# Patient Record
Sex: Female | Born: 1955 | Race: White | Hispanic: No | Marital: Married | Smoking: Never smoker
Health system: Southern US, Community
[De-identification: ages and names within clinical notes are randomized; demographics above are authoritative.]

## PROBLEM LIST (undated history)

## (undated) ENCOUNTER — Emergency Department: Discharge status: Absent without leave | Payer: Medicare Other

## (undated) DIAGNOSIS — F603 Borderline personality disorder: Secondary | ICD-10-CM

## (undated) DIAGNOSIS — F32A Depression, unspecified: Secondary | ICD-10-CM

## (undated) DIAGNOSIS — M1990 Unspecified osteoarthritis, unspecified site: Secondary | ICD-10-CM

## (undated) DIAGNOSIS — F329 Major depressive disorder, single episode, unspecified: Secondary | ICD-10-CM

## (undated) DIAGNOSIS — B029 Zoster without complications: Secondary | ICD-10-CM

## (undated) DIAGNOSIS — M797 Fibromyalgia: Secondary | ICD-10-CM

## (undated) HISTORY — PX: SHOULDER SURGERY: SHX246

## (undated) HISTORY — PX: GASTRIC BYPASS: SHX52

## (undated) HISTORY — PX: CHOLECYSTECTOMY: SHX55

---

## 2014-02-03 ENCOUNTER — Ambulatory Visit: Payer: Self-pay | Admitting: Family Medicine

## 2014-02-05 ENCOUNTER — Emergency Department: Payer: Self-pay | Admitting: Emergency Medicine

## 2014-02-05 LAB — BASIC METABOLIC PANEL
Anion Gap: 5 — ABNORMAL LOW (ref 7–16)
BUN: 9 mg/dL (ref 7–18)
CHLORIDE: 107 mmol/L (ref 98–107)
CO2: 27 mmol/L (ref 21–32)
Calcium, Total: 8.3 mg/dL — ABNORMAL LOW (ref 8.5–10.1)
Creatinine: 0.88 mg/dL (ref 0.60–1.30)
Glucose: 115 mg/dL — ABNORMAL HIGH (ref 65–99)
Osmolality: 277 (ref 275–301)
Potassium: 4 mmol/L (ref 3.5–5.1)
Sodium: 139 mmol/L (ref 136–145)

## 2014-02-05 LAB — CBC WITH DIFFERENTIAL/PLATELET
BASOS ABS: 0.1 10*3/uL (ref 0.0–0.1)
BASOS PCT: 0.8 %
Eosinophil #: 0.1 10*3/uL (ref 0.0–0.7)
Eosinophil %: 1.9 %
HCT: 35.2 % (ref 35.0–47.0)
HGB: 11.1 g/dL — AB (ref 12.0–16.0)
Lymphocyte #: 1.8 10*3/uL (ref 1.0–3.6)
Lymphocyte %: 27.2 %
MCH: 26.3 pg (ref 26.0–34.0)
MCHC: 31.5 g/dL — AB (ref 32.0–36.0)
MCV: 83 fL (ref 80–100)
MONO ABS: 0.4 x10 3/mm (ref 0.2–0.9)
MONOS PCT: 5.8 %
Neutrophil #: 4.2 10*3/uL (ref 1.4–6.5)
Neutrophil %: 64.3 %
PLATELETS: 330 10*3/uL (ref 150–440)
RBC: 4.22 10*6/uL (ref 3.80–5.20)
RDW: 14.6 % — AB (ref 11.5–14.5)
WBC: 6.5 10*3/uL (ref 3.6–11.0)

## 2014-02-05 LAB — URINALYSIS, COMPLETE
BILIRUBIN, UR: NEGATIVE
Blood: NEGATIVE
Glucose,UR: NEGATIVE mg/dL (ref 0–75)
KETONE: NEGATIVE
Nitrite: NEGATIVE
Ph: 6 (ref 4.5–8.0)
Protein: NEGATIVE
RBC, UR: NONE SEEN /HPF (ref 0–5)
SPECIFIC GRAVITY: 1.03 (ref 1.003–1.030)

## 2014-02-05 LAB — HEPATIC FUNCTION PANEL A (ARMC)
AST: 21 U/L (ref 15–37)
Albumin: 3.2 g/dL — ABNORMAL LOW (ref 3.4–5.0)
Alkaline Phosphatase: 106 U/L
BILIRUBIN DIRECT: 0.1 mg/dL (ref 0.00–0.20)
BILIRUBIN TOTAL: 0.6 mg/dL (ref 0.2–1.0)
SGPT (ALT): 19 U/L
TOTAL PROTEIN: 7 g/dL (ref 6.4–8.2)

## 2014-02-05 LAB — SEDIMENTATION RATE: Erythrocyte Sed Rate: 49 mm/hr — ABNORMAL HIGH (ref 0–30)

## 2014-02-05 LAB — LIPASE, BLOOD: LIPASE: 76 U/L (ref 73–393)

## 2014-02-05 LAB — TROPONIN I

## 2014-02-05 LAB — TSH: Thyroid Stimulating Horm: 2.63 u[IU]/mL

## 2014-02-08 ENCOUNTER — Emergency Department: Payer: Self-pay | Admitting: Emergency Medicine

## 2014-02-08 LAB — TROPONIN I

## 2014-02-08 LAB — COMPREHENSIVE METABOLIC PANEL
ALBUMIN: 3.3 g/dL — AB (ref 3.4–5.0)
ANION GAP: 3 — AB (ref 7–16)
Alkaline Phosphatase: 98 U/L
BUN: 8 mg/dL (ref 7–18)
Bilirubin,Total: 0.4 mg/dL (ref 0.2–1.0)
CREATININE: 0.84 mg/dL (ref 0.60–1.30)
Calcium, Total: 8.7 mg/dL (ref 8.5–10.1)
Chloride: 109 mmol/L — ABNORMAL HIGH (ref 98–107)
Co2: 29 mmol/L (ref 21–32)
EGFR (African American): >60
GLUCOSE: 96 mg/dL (ref 65–99)
Osmolality: 279 (ref 275–301)
Potassium: 3.1 mmol/L — ABNORMAL LOW (ref 3.5–5.1)
SGOT(AST): 20 U/L (ref 15–37)
SGPT (ALT): 16 U/L
SODIUM: 141 mmol/L (ref 136–145)
TOTAL PROTEIN: 7.4 g/dL (ref 6.4–8.2)

## 2014-02-08 LAB — CBC
HCT: 35.9 % (ref 35.0–47.0)
HGB: 11.6 g/dL — ABNORMAL LOW (ref 12.0–16.0)
MCH: 26.8 pg (ref 26.0–34.0)
MCHC: 32.2 g/dL (ref 32.0–36.0)
MCV: 83 fL (ref 80–100)
PLATELETS: 345 10*3/uL (ref 150–440)
RBC: 4.31 10*6/uL (ref 3.80–5.20)
RDW: 14.5 % (ref 11.5–14.5)
WBC: 10.1 10*3/uL (ref 3.6–11.0)

## 2014-06-30 ENCOUNTER — Inpatient Hospital Stay: Payer: Self-pay | Admitting: Psychiatry

## 2014-07-29 ENCOUNTER — Inpatient Hospital Stay: Payer: Self-pay | Admitting: Psychiatry

## 2014-09-14 NOTE — Consult Note (Signed)
PATIENT NAME:  Ann Bowen, Ann Bowen MR#:  [id number] DATE OF BIRTH:  Jun 01, 1955  IDENTIFYING INFORMATION AND REASON FOR CONSULT: A 59 year old woman with a history of depression, who comes in the hospital stating, "things have escalated."   HISTORY OF PRESENT ILLNESS: The patient's daughter got married in [place] yesterday. This was completely anticipated, but at the same time, the patient got overwhelmed. Mood is feeling very upset and depressed. She started having suicidal thoughts with thoughts of killing herself by stepping out into traffic or crashing a car. She has been compliant with the medications she was prescribed when she was discharged and has seen a Marketing executive, but has not gone to see a psychiatrist yet. Appointment had not come up yet. The patient denies that she has been drinking or using any drugs. Major stress is the problems in her family life. She had been sleeping okay up until yesterday. Not having any hallucinations. Mood, however, had been consistently sad and depressed. She has had some violent fantasies about her daughter, but has no plan or intention of doing anything violent.   PAST PSYCHIATRIC HISTORY: The patient was just discharged from our hospital within the last week or so. Has had 2 prior psychiatric hospitalizations before. Prior history of suicide attempts. Prior history of multiple medications. Past history of possible bipolar disorder, no clear history of mania.   FAMILY HISTORY: Multiple members of the family with depression and bipolar disorder.   SOCIAL HISTORY: Still living with her sister. Still focused strongly on her problems with her adult children.   PAST MEDICAL HISTORY: History of fibromyalgia and restless legs.   Substance abuse history: Denies that she has been using any alcohol or drugs since last discharge.   CURRENT MEDICATIONS: Tegretol 200 mg b.i.d., clonazepam 1 mg b.i.d., Flexeril 10 mg at night as needed for muscle pain, duloxetine 60 mg  twice a day, Remeron 7.5 mg at night, Requip 4 mg at night, trazodone 200 mg at night.   ALLERGIES: WELLBUTRIN.  REVIEW OF SYSTEMS: Depressed mood. Low energy. Suicidal ideation with plan of stepping in front of traffic. Violent thoughts, but no clear homicidal ideation. No hallucinations.   MENTAL STATUS EXAMINATION: Disheveled woman, looks her stated age or older, cooperative with the interview. Eye contact good. Psychomotor activity sluggish. Speech is slow and decreased in amount. Affect sad and tearful. Mood stated as depressed. Thoughts are lucid without obvious loosening of associations or delusions. Denies auditory or visual hallucinations. Endorses suicidal ideation. No homicidal ideation. She is alert and oriented x4. Repeats 3 objects immediately, remembers 2 out of 3 at 3 minutes. Judgment and insight reasonably adequate. Normal intelligence.   LABORATORY RESULTS: Laboratories done in the Emergency Room included an alcohol level that is negative. Chemistry panel all normal. Drug screen positive for benzodiazepines. CBC with slightly low hemoglobin 11.2. Urinalysis 2+ leukocyte esterase, likely infection.   ASSESSMENT: This is a 59 year old woman with severe major depression, recurrent, comes back into the hospital with depressed mood, active suicidal ideation. Has been compliant and not abusing substances, but continues to be under major stresses. Needs hospitalization for safety.   TREATMENT PLAN: Admit to psychiatry. Suicide precautions in place. Continue medications from previously. Septra as being prescribed for urinary tract infection.   DIAGNOSIS, PRINCIPAL AND PRIMARY:   AXIS I: Major depression, severe, recurrent.   SECONDARY DIAGNOSES:  AXIS I: Rule out bipolar disorder, type II.   AXIS II: Deferred.   AXIS III: History of fibromyalgia, urinary tract infection.  ____________________________ Gonzella Lex, MD jtc:ap D: 07/29/2014 18:19:31 ET T: 07/29/2014 18:42:28  ET JOB#: [id number]  cc: Gonzella Lex, MD, <Dictator> Gonzella Lex MD ELECTRONICALLY SIGNED 08/01/2014 10:17

## 2014-09-14 NOTE — Consult Note (Signed)
Brief Consult Note: Diagnosis: depression.   Patient was seen by consultant.   Consult note dictated.   Orders entered.   Comments: Psychiatry: Worsening depression with suicidal ideation. Not safe for discharge. Will restart meds and admit when bed avavilible.  Electronic Signatures: Jadzia Ibsen, Madie Reno (MD)  (Signed 15-Mar-16 12:00)  Authored: Brief Consult Note   Last Updated: 15-Mar-16 12:00 by Gonzella Lex (MD)

## 2014-09-14 NOTE — Consult Note (Signed)
PATIENT NAME:  Ann Bowen, Ann Bowen MR#:  [id number] DATE OF BIRTH:  Feb 23, 1956  DATE OF CONSULTATION:  06/30/2014  CONSULTING PHYSICIAN:  Gonzella Lex, MD  IDENTIFYING INFORMATION AND REASON FOR CONSULTATION: A 59 year old woman with a past history of depression who came into the hospital because of suicide attempt. The patient's chief complaint "It's been building over weeks."  HISTORY OF PRESENT ILLNESS: Information obtained from the patient and the chart. The patient cut herself on the left wrist this morning hoping that she would bleed to death. She also took 5-6 of her clonazepam, but says that she did not really expect those to do any harm. Her mood has been depressed for a long time, but things have been getting much worse over the last couple weeks. The last straw was when she found out that not only is she not invited to her daughter's wedding, that her son is planning to attend the wedding. She sees all of this as being a "betrayal." Sleep is poor. Feels down most of the time. Seems to feel hopeless. Having suicidal thoughts. Does not report any hallucinations. She is getting outpatient psychiatric treatment from a doctor affiliated with [facility] and has been compliant with her medication. Says she drinks occasionally, but minimizes the severity of it, and is not abusing other drugs. Major stress currently seems to be the estrangement from her family, especially from her daughter who is not inviting her to the wedding, although sounds like there have been a lot of losses in her life recently.   PAST PSYCHIATRIC HISTORY: It sounds like she has had at least 2 prior psychiatric hospitalizations, 1 at [facility] earlier in 2015 and 1 in [place] several years ago after a suicide attempt. Has taken overdoses in the past. Has been on multiple medications, she cannot remember all of them, but says she is currently taking Cymbalta and Remeron and Klonopin. There has been a question about bipolar disorder.  She is not  sure what medication she might have taken for that.   FAMILY HISTORY: Extensive, with multiple members with depression and bipolar disorder.   SOCIAL HISTORY: Currently living with her sister. Has adult children from whom it sounds like she is either completely or partially estranged. 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. She makes a point of this and clearly feels humiliated about it.   PAST MEDICAL HISTORY: Fibromyalgia, arthritis, and restless leg syndrome.   SUBSTANCE ABUSE HISTORY: Says that in the time since Christmas, she thinks she has drank a couple of bottles of wine. No more than that. Denies any drug abuse. Denies any past substance abuse problems.   CURRENT MEDICATIONS: She could not remember the doses, but according to the reconciliation she is taking Cymbalta 60 mg twice a day, clonazepam 1 mg twice a day, Requip 2 mg at night and from what she told me probably also Remeron 7.5 mg at night.   ALLERGIES: WELLBUTRIN.   REVIEW OF SYSTEMS: Depressed mood. Hopelessness. Suicidal ideation. No hallucinations. Other than general aches and pains, no specific medical complaints.   MENTAL STATUS EXAMINATION: Middle-aged woman, looks her stated age, cooperative with the interview. Eye contact good. Psychomotor activity fairly normal. Speech is normal in rate, tone, and volume. Affect is slightly dysphoric. Mood is stated as depressed. Thoughts lucid without loosening of associations. No evidence of delusions. Denies auditory or  visual hallucinations. Endorses suicidal ideation. No homicidal ideation. She is alert and oriented x 4. Remembers 3/3 objects immediately and at 3 minutes. Judgment and insight recently at least impaired.   LABORATORY RESULTS: Drug screen is all negative. TSH normal at 3.0. Salicylates negative. CBC unremarkable. Alcohol level negative. Chemistry panel  unremarkable.   VITAL SIGNS: Blood pressure 138/69, respirations 18, pulse 70, temperature 98.2.   ASSESSMENT: This is a 59 year old woman with a history of major depression or bipolar disorder, currently depressed, made a suicide attempt. Despite the fairly superficial cuts on her left wrist, it sounds like she actually had suicidal intent and continues to feel hopeless. Needs hospitalization for safety and stabilization.   TREATMENT PLAN: The case discussed with ER doctor and psychiatric team. The patient will be admitted to psychiatry when we have a bed available. Continue current medications. Psychoeducation completed with the patient.   DIAGNOSIS, PRINCIPAL AND PRIMARY:  AXIS I: Major depression, severe, recurrent.   SECONDARY DIAGNOSES: AXIS I: Deferred.  AXIS II:  Deferred. AXIS III: Fibromyalgia.    ____________________________ Gonzella Lex, MD jtc:LT D: 06/30/2014 15:57:14 ET T: 06/30/2014 16:13:25 ET JOB#: [id number]  cc: Gonzella Lex, MD, <Dictator> Gonzella Lex MD ELECTRONICALLY SIGNED 07/22/2014 10:33

## 2014-09-14 NOTE — H&P (Signed)
PATIENT NAME:  Ann Bowen, Ann Bowen MR#:  [id number] DATE OF BIRTH:  09/15/1955  DATE OF ADMISSION:  06/30/2014   REFERRING PHYSICIAN: Emergency Room MD.   ATTENDING PHYSICIAN: Orson Slick, MD.   IDENTIFYING DATA: Ann Bowen is a 59 year old female with history of depression.   CHIEF COMPLAINT: "I cut myself."   HISTORY OF PRESENT ILLNESS:  Ann Bowen has a long history of depression, anxiety, and mood instability.  She moved in [place] a year and a half ago.  She has been in the care of Dr. Luetta Bowen who is associated with [facility].  She has been maintained on a combination of Cymbalta, Remeron and clonazepam with excellent results. She admits that she stopped taking her medications, except for Klonopin, about a week ago.  She became increasingly depressed and anxious.  She received information that her daughter, who is in her 67s and living in [place], got engaged and is getting married in the middle of March. The patient will not be invited to the wedding.  She felt hurt and unimportant, even more so when she realized that her son who lives in [place] is invited to the ceremony.  She felt that her son should have taken a stand and refused to go unless the mother is invited.  She became increasingly distraught, with poor sleep, decreased appetite, anhedonia, feeling of guilt, worthlessness, hopelessness, poor energy and concentration, social isolation, crying spells.  She impulsively sent messages to 15 members of her family telling her goodbyes.  She took 4 Klonopin over 6 hours, which she does not consider an overdose.  She also superficially cut her wrist with a new tomato knife.  She made it clear that she did not want to use any of the kitchen equipment in her sister's house and wanted to use something that does not have any value, sentimental or otherwise to her surviving sister.  By sending text messages, she made her sister aware of the situation. She was brought her  to the [place].  In addition to symptoms of depression, the patient reports a long history of anxiety with panic attacks.  Lately she has 1 panic attack at least every day before getting to work, which she hates.  She also has periods when her social anxiety is somewhat worse and she has difficulties in crowds.  She has times when. She has OCD symptoms with excessive cleaning, but it comes and goes.  She reports frequent mood swings usually from hours to hours. She has periods of deep depression that are difficult to overcome.  She remembers at least 1 episode, when she flew to [place] to see [place] in concert for 10 days in a row.   She does spend money on occasion.  She has periods of, hyperactivity, racing thoughts, increased speech.  She denies ever being psychotic.  She denies alcohol, illicit substance or prescription pill abuse.   PAST PSYCHIATRIC HISTORY: A long history of depression and anxiety. She has been treated with probably almost all antidepressants. She feels that the current combination of Cymbalta and Remeron is working; however, Remeron caused 18 pounds weight gain in the past 6 weeks, which is unacceptable as the patient has a history of gastric bypass.  She attempted suicide once several years ago and when she was still in the [place], after her boyfriend left her and married another woman.  She has 1 hospitalization at [place] since she returned to [place] during which she received 5  or 6 ECT treatments that was very helpful.  She follows up with Dr. Luetta [facility].   FAMILY PSYCHIATRIC HISTORY: Multiple family members with depression and anxiety and 1 sister with real anger problems, possibly bipolar.   PAST MEDICAL HISTORY: Status post gastric bypass, fibromyalgia.   ALLERGIES: WELLBUTRIN.   MEDICATIONS ON ADMISSION:  Klonopin 1 mg 4 times daily, Cymbalta 60 mg twice daily, Remeron 7.5 mg at bedtime, Requip 4 mg at bedtime.   SOCIAL HISTORY: She was married twice.  The 2 children are  from her first marriage, then she married a much more colorful Scientist, research (physical sciences).  She is divorced now.  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  She is very hurt and disappointed.  She was making good money and feeling good about herself.  Since then, she relocated to [place]. She lives with her sister. She has a job as a Scientist, clinical (histocompatibility and immunogenetics) at [place] which she hates.  Her son and daughter are both adults.     REVIEW OF SYSTEMS:  CONSTITUTIONAL: No fevers or chills. Positive for 18 pounds weight again lately.    EYES: No double or blurred vision.  ENT: No hearing loss.  RESPIRATORY: No shortness of breath or cough.  CARDIOVASCULAR: No chest pain or orthopnea.  GASTROINTESTINAL: No abdominal pain, nausea, vomiting, or diarrhea.  GENITOURINARY: No incontinence or frequency.  ENDOCRINE: No heat or cold intolerance.  LYMPHATIC: No anemia or easy bruising.  INTEGUMENTARY: No acne or rash.  MUSCULOSKELETAL: No muscle or joint pain.  NEUROLOGIC: No tingling or weakness.  PSYCHIATRIC: See history of present illness for details.   PHYSICAL EXAMINATION:  VITAL SIGNS: Blood pressure 159/95, pulse 76, temperature 98.2.  GENERAL: This is a slightly obese, middle-aged female in no acute distress.  HEENT: The pupils are equal, round, and reactive to light. Sclerae anicteric.  NECK: Supple. No thyromegaly.  LUNGS: Clear to auscultation. No dullness to percussion.  HEART: Regular rhythm and rate. No murmurs, rubs, or gallops.  ABDOMEN: Soft, nontender, nondistended. Positive bowel sounds.  MUSCULOSKELETAL: Normal muscle strength in all extremities.  SKIN: No rashes or bruises.  LYMPHATIC: No cervical adenopathy.  NEUROLOGIC: Cranial nerves II through XII are intact.   LABORATORY DATA: Chemistries are within normal limits. Blood alcohol level 0. LFTs within normal limits. TSH 3. Urine tox screen negative for substances. CBC within normal limits. Serum  acetaminophen and salicylates are low.   MENTAL STATUS EXAMINATION ON ADMISSION: The patient is alert and oriented to person, place, time and situation. She is pleasant, polite and cooperative. She is well groomed and casually dressed. She maintains good eye contact. Her speech is of normal rhythm, rate and volume.  Mood is depressed with full affect. Thought process is logical and goal oriented. Thought content: She denies thoughts of hurting herself or others, but was admitted after a suicide by overdose and cutting.  She denies thoughts of hurting others. There are no delusions or paranoia. There are no auditory or visual hallucinations. Her cognition is grossly intact. Registration, recall, short and long-term memory are intact. She is of average intelligence and fund of knowledge. Her insight and judgment are limited.   SUICIDE RISK ASSESSMENT ON ADMISSION: This is a patient with a long history of depression, anxiety, and mood instability who was admitted after a suicide attempt.  INITIAL DIAGNOSES:  AXIS I: Major depressive disorder, recurrent, severe, rule out bipolar 2 disorder.  AXIS II:  Deferred.  AXIS III: Fibromyalgia, status post gastric bypass.   PLAN: The patient was admitted to [facility] unit for safety, stabilization and medication management.  1.  Suicidal ideation: The patient is able to contract for safety.  2.  Mood. We will continue Cymbalta and the Remeron for depression, will restart Tegretol for mood stabilization.   3.  Anxiety. We will continue clonazepam 4 mg a day as prescribed in the community.  4.  Smoking. We will provide nicotine products.  The patient is not interested in smoking cessation at this point.   DISPOSITION: She will be discharged to home. She will follow up with Dr. Luetta Bowen as usual.     ____________________________ Wardell Honour. Bary Leriche, MD jbp:DT D: 07/01/2014 15:04:22 ET T: 07/01/2014 15:52:36  ET JOB#: [id number]  cc: Jaan Fischel B. Bary Leriche, MD, <Dictator> Clovis Fredrickson MD ELECTRONICALLY SIGNED 07/28/2014 7:17
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
## 2014-10-16 ENCOUNTER — Emergency Department
Admission: EM | Admit: 2014-10-16 | Discharge: 2014-10-16 | Disposition: A | Discharge status: Home / Self Care | Payer: PRIVATE HEALTH INSURANCE | Attending: Emergency Medicine | Admitting: Emergency Medicine

## 2014-10-16 ENCOUNTER — Encounter: Payer: Self-pay | Admitting: General Practice

## 2014-10-16 ENCOUNTER — Emergency Department: Payer: PRIVATE HEALTH INSURANCE

## 2014-10-16 DIAGNOSIS — S199XXA Unspecified injury of neck, initial encounter: Secondary | ICD-10-CM | POA: Diagnosis not present

## 2014-10-16 DIAGNOSIS — S3992XA Unspecified injury of lower back, initial encounter: Secondary | ICD-10-CM | POA: Diagnosis not present

## 2014-10-16 DIAGNOSIS — Y998 Other external cause status: Secondary | ICD-10-CM | POA: Diagnosis not present

## 2014-10-16 DIAGNOSIS — S4992XA Unspecified injury of left shoulder and upper arm, initial encounter: Secondary | ICD-10-CM | POA: Diagnosis present

## 2014-10-16 DIAGNOSIS — Y92410 Unspecified street and highway as the place of occurrence of the external cause: Secondary | ICD-10-CM | POA: Insufficient documentation

## 2014-10-16 DIAGNOSIS — Y9389 Activity, other specified: Secondary | ICD-10-CM | POA: Insufficient documentation

## 2014-10-16 DIAGNOSIS — S46912A Strain of unspecified muscle, fascia and tendon at shoulder and upper arm level, left arm, initial encounter: Secondary | ICD-10-CM | POA: Diagnosis not present

## 2014-10-16 MED ORDER — NAPROXEN 500 MG PO TABS: 500.0000 mg | ORAL_TABLET | Freq: Two times a day (BID) | ORAL | Status: DC

## 2014-10-16 MED ORDER — DIAZEPAM 2 MG PO TABS: 2.0000 mg | ORAL_TABLET | Freq: Three times a day (TID) | ORAL | Status: DC | PRN
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
## 2014-12-06 ENCOUNTER — Encounter: Payer: Self-pay | Admitting: Emergency Medicine

## 2014-12-06 ENCOUNTER — Emergency Department
Admission: EM | Admit: 2014-12-06 | Discharge: 2014-12-06 | Disposition: A | Discharge status: Home / Self Care | Payer: PRIVATE HEALTH INSURANCE | Attending: Emergency Medicine | Admitting: Emergency Medicine

## 2014-12-06 DIAGNOSIS — R21 Rash and other nonspecific skin eruption: Secondary | ICD-10-CM | POA: Diagnosis present

## 2014-12-06 DIAGNOSIS — L259 Unspecified contact dermatitis, unspecified cause: Secondary | ICD-10-CM | POA: Insufficient documentation

## 2014-12-06 HISTORY — DX: Depression, unspecified: F32.A

## 2014-12-06 HISTORY — DX: Fibromyalgia: M79.7

## 2014-12-06 HISTORY — DX: Unspecified osteoarthritis, unspecified site: M19.90

## 2014-12-06 HISTORY — DX: Zoster without complications: B02.9

## 2014-12-06 HISTORY — DX: Borderline personality disorder: F60.3

## 2014-12-06 HISTORY — DX: Major depressive disorder, single episode, unspecified: F32.9

## 2014-12-06 MED ORDER — DIPHENHYDRAMINE HCL 25 MG PO CAPS: 25.0000 mg | ORAL_CAPSULE | ORAL | Status: DC | PRN

## 2014-12-06 MED ORDER — PREDNISONE 10 MG PO TABS: 10.0000 mg | ORAL_TABLET | Freq: Every day | ORAL | Status: DC

## 2014-12-06 MED ORDER — PREDNISONE 20 MG PO TABS
60.0000 mg | ORAL_TABLET | Freq: Once | ORAL | Status: AC
  Administered 2014-12-06: 60 mg via ORAL
  Filled 2014-12-06: qty 3

## 2014-12-06 NOTE — ED Notes (Signed)
Pt presents to ER alert and in NAD. Pt states rash all over body for several days.

## 2014-12-06 NOTE — Discharge Instructions (Signed)
Contact Dermatitis °Contact dermatitis is a rash that happens when something touches the skin. You touched something that irritates your skin, or you have allergies to something you touched. °HOME CARE  °· Avoid the thing that caused your rash. °· Keep your rash away from hot water, soap, sunlight, chemicals, and other things that might bother it. °· Do not scratch your rash. °· You can take cool baths to help stop itching. °· Only take medicine as told by your doctor. °· Keep all doctor visits as told. °GET HELP RIGHT AWAY IF:  °· Your rash is not better after 3 days. °· Your rash gets worse. °· Your rash is puffy (swollen), tender, red, sore, or warm. °· You have problems with your medicine. °MAKE SURE YOU:  °· Understand these instructions. °· Will watch your condition. °· Will get help right away if you are not doing well or get worse. °Document Released: 02/27/2009 Document Revised: 07/25/2011 Document Reviewed: 10/05/2010 °ExitCare® Patient Information ©2015 [employer]. This information is not intended to replace advice given to you by your health care provider. Make sure you discuss any questions you have with your health care provider. ° °

## 2014-12-06 NOTE — ED Provider Notes (Signed)
CSN: [id number]     Arrival date & time 12/06/14  2220 History   First MD Initiated Contact with Patient 12/06/14 2321     Chief Complaint  Patient presents with  . Rash    Pt presents to ER alert and in NAD. Pt states rash all over body for several days.     (Consider location/radiation/quality/duration/timing/severity/associated sxs/prior Treatment) HPI  59 year old female presents today for evaluation of rash. Rash is been present for 3 days. She states it began after she was in the woods. She denies any contact with any specific irritant. Rashes on both arms and both legs. She describes the rash as pruritic with hives. She denies any chest pain shortness of breath fevers headaches or recent tick bite. Patient has not tried any and histamines for pruritus.   Past Medical History  Diagnosis Date  . Shingles   . Arthritis   . Fibromyalgia   . Depression   . Borderline personality disorder    Past Surgical History  Procedure Laterality Date  . Gastric bypass    . Cholecystectomy    . Shoulder surgery     History reviewed. No pertinent family history. History  Substance Use Topics  . Smoking status: Never Smoker   . Smokeless tobacco: Never Used  . Alcohol Use: No   OB History    No data available     Review of Systems  Constitutional: Negative for fever, chills, activity change and fatigue.  HENT: Negative for congestion, sinus pressure and sore throat.   Eyes: Negative for visual disturbance.  Respiratory: Negative for cough, chest tightness and shortness of breath.   Cardiovascular: Negative for chest pain and leg swelling.  Gastrointestinal: Negative for nausea, vomiting, abdominal pain and diarrhea.  Genitourinary: Negative for dysuria.  Musculoskeletal: Negative for arthralgias and gait problem.  Skin: Positive for rash.  Neurological: Negative for weakness, numbness and headaches.  Hematological: Negative for adenopathy.  Psychiatric/Behavioral: Negative for  behavioral problems, confusion and agitation.      Allergies  Wellbutrin  Home Medications   Prior to Admission medications   Medication Sig Start Date End Date Taking? Authorizing Provider  diazepam (VALIUM) 2 MG tablet Take 1 tablet (2 mg total) by mouth every 8 (eight) hours as needed for anxiety. 10/16/14 10/16/15  Victorino Dike, FNP  diphenhydrAMINE (BENADRYL) 25 mg capsule Take 1 capsule (25 mg total) by mouth every 4 (four) hours as needed. 12/06/14 12/06/15  Duanne Guess, PA-C  naproxen (NAPROSYN) 500 MG tablet Take 1 tablet (500 mg total) by mouth 2 (two) times daily with a meal. 10/16/14 10/16/15  Cari B Triplett, FNP  predniSONE (DELTASONE) 10 MG tablet Take 1 tablet (10 mg total) by mouth daily. 6,5,4,3,2,1 six day taper 12/06/14   Duanne Guess, PA-C   BP 126/63 mmHg  Pulse 84  Temp(Src) 98 F (36.7 C) (Oral)  Resp 20  Ht 5\' 3"  (1.6 m)  Wt 220 lb (99.791 kg)  BMI 38.98 kg/m2  SpO2 97% Physical Exam  Constitutional: She is oriented to person, place, and time. She appears well-developed and well-nourished. No distress.  HENT:  Head: Normocephalic and atraumatic.  Mouth/Throat: Oropharynx is clear and moist.  Eyes: EOM are normal. Pupils are equal, round, and reactive to light. Right eye exhibits no discharge. Left eye exhibits no discharge.  Neck: Normal range of motion. Neck supple.  Cardiovascular: Normal rate, regular rhythm and intact distal pulses.   Pulmonary/Chest: Effort normal and breath sounds normal. No  respiratory distress. She exhibits no tenderness.  Abdominal: Soft. She exhibits no distension. There is no tenderness.  Musculoskeletal: Normal range of motion. She exhibits no edema.  Neurological: She is alert and oriented to person, place, and time. She has normal reflexes.  Skin: Skin is warm and dry. Rash noted. Rash is maculopapular and urticarial.     No bull's-eye lesions. Rash is not present within the digits or on the palms or soles. No oral  lesions seen.  Psychiatric: She has a normal mood and affect. Her behavior is normal. Thought content normal.    ED Course  Procedures (including critical care time) Labs Review Labs Reviewed - No data to display  Imaging Review No results found.   EKG Interpretation None      MDM   Final diagnoses:  Skin rash  Contact dermatitis    59 year old female with urinary rash for the last 3 days. Rash appears to be consistent with contact dermatitis. We'll treat with 6 day prednisone taper and Benadryl. Return to the ER for any worsening symptoms or urgent changes in her health.    Duanne Guess, PA-C 12/06/14 2339  Lavonia Drafts, MD 12/08/14 [phone]

## 2014-12-06 NOTE — ED Notes (Signed)
Pt. States rash developed over most of the body.  Pt. States she was working out in [employer].  Pt. Unsure if they were bites.  Pt. States itching became almost unbearable.

## 2015-03-12 ENCOUNTER — Emergency Department
Admission: EM | Admit: 2015-03-12 | Discharge: 2015-03-12 | Disposition: A | Discharge status: Home / Self Care | Payer: PRIVATE HEALTH INSURANCE | Attending: Emergency Medicine | Admitting: Emergency Medicine

## 2015-03-12 ENCOUNTER — Encounter: Payer: Self-pay | Admitting: *Deleted

## 2015-03-12 ENCOUNTER — Emergency Department
Admission: EM | Admit: 2015-03-12 | Discharge: 2015-03-12 | Discharge status: Absent without leave | Payer: No Typology Code available for payment source

## 2015-03-12 DIAGNOSIS — Z791 Long term (current) use of non-steroidal anti-inflammatories (NSAID): Secondary | ICD-10-CM | POA: Diagnosis not present

## 2015-03-12 DIAGNOSIS — K0889 Other specified disorders of teeth and supporting structures: Secondary | ICD-10-CM | POA: Diagnosis present

## 2015-03-12 DIAGNOSIS — Z79899 Other long term (current) drug therapy: Secondary | ICD-10-CM | POA: Diagnosis not present

## 2015-03-12 DIAGNOSIS — K047 Periapical abscess without sinus: Secondary | ICD-10-CM

## 2015-03-12 DIAGNOSIS — K089 Disorder of teeth and supporting structures, unspecified: Secondary | ICD-10-CM | POA: Diagnosis not present

## 2015-03-12 DIAGNOSIS — K08409 Partial loss of teeth, unspecified cause, unspecified class: Secondary | ICD-10-CM | POA: Insufficient documentation

## 2015-03-12 MED ORDER — OXYCODONE-ACETAMINOPHEN 5-325 MG PO TABS: 1.0000 | ORAL_TABLET | Freq: Four times a day (QID) | ORAL | Status: DC | PRN

## 2015-03-12 MED ORDER — AMOXICILLIN-POT CLAVULANATE 875-125 MG PO TABS: 1.0000 | ORAL_TABLET | Freq: Two times a day (BID) | ORAL | Status: DC

## 2015-03-12 MED ORDER — MAGIC MOUTHWASH W/LIDOCAINE: 5.0000 mL | Freq: Four times a day (QID) | ORAL | Status: DC

## 2015-03-12 NOTE — ED Notes (Signed)
States she had a tooth pulled last week.. Developed swelling and increased pain .Marland Kitchenwas seen by dentist again and had gum/jaw scraped. Now is having increased pain

## 2015-03-12 NOTE — Discharge Instructions (Signed)
Dental Extraction A dental extraction is the removal (extraction) of a tooth. You may need to have a dental extraction if:   You have tooth decay or gum disease.  You have an infection (abscess).  Room needs to be made for other teeth to grow in or to be aligned properly.  Baby (primary) teeth are preventing adult (permanent) teeth from coming to the surface (erupting).  You have a tooth fracture or fractures that are not repairable.  You are going to be having radiation to your head and neck. The type and length of procedure that you have depends on the reason for the extraction and the placement of the tooth or teeth that are being removed. The procedure may be:  A simple extraction. This is done if the tooth is visible in the mouth and is above the gumline.  A surgical extraction. This is done if the tooth has not come into the mouth or if the tooth is broken off below the gumline. LET [facility] CARE PROVIDER KNOW ABOUT:  Any allergies you have.  All medicines you are taking, including vitamins, herbs, eye drops, creams, and over-the-counter medicines.  Previous problems you or members of your family have had with the use of anesthetics.  Any blood disorders you have.  Previous surgeries you have had.  Any medical conditions you may have. RISKS AND COMPLICATIONS Generally, this is a safe procedure. However, problems may occur, including:  Damage to surrounding teeth, nerves, tissues, or structures.  The blood clot does not form or stay in place where the tooth was removed. This causes the bones and nerves underneath to be exposed (dry socket). This can delay healing.  Incomplete extraction of roots.  Jawbone injury, pain, or weakness. BEFORE THE PROCEDURE  Ask your health care provider about:  Changing or stopping your regular medicines. This is especially important if you are taking diabetes medicines or blood thinners.  Taking medicines such as aspirin and  ibuprofen. These medicines can thin your blood. Do not take these medicines before your procedure if your health care provider instructs you not to.  Take medicines, such as antibiotic medicines, as directed by your health care provider.  Follow instructions from your health care provider about eating or drinking restrictions.  Plan to have someone take you home after the procedure.  If you go home right after the procedure, plan to have someone with you for 24 hours. PROCEDURE  You may be given one or more of the following:  A medicine that helps you relax (sedative).  A medicine that numbs the area (local anesthetic).  A medicine that makes you fall asleep (general anesthetic).  If you are having a simple extraction:  Your dentist will loosen the tooth with an instrument called an elevator.  Another instrument called forceps will be used to grasp the tooth and remove it from the socket.  The open socket will be cleaned.  Gauze will be placed in the socket to reduce bleeding.  If you are having a surgical extraction:  Your dentist will make an incision in the gum.  Some of the bone around the tooth may need to be removed.  The tooth will be removed.  Stitches (sutures) may be required to close the area. The procedure may vary among health care providers and hospitals. AFTER THE PROCEDURE  You may have gauze in your mouth where the tooth was removed. If directed by your health care provider, apply gentle pressure on the gauze for  up to one hour after the procedure. This will help to control bleeding.  A blood clot should begin to form over the open socket. This is normal. Do not touch the area, and do not rinse it.  You may be given medicines to help control pain and help your recovery.   This information is not intended to replace advice given to you by your health care provider. Make sure you discuss any questions you have with your health care provider.   Document  Released: 05/02/2005 Document Revised: 09/16/2014 Document Reviewed: 04/28/2014 Elsevier Interactive Patient Education 2016 [place] Abscess A dental abscess is a collection of pus in or around a tooth. CAUSES This condition is caused by a bacterial infection around the root of the tooth that involves the inner part of the tooth (pulp). It may result from:  Severe tooth decay.  Trauma to the tooth that allows bacteria to enter into the pulp, such as a broken or chipped tooth.  Severe gum disease around a tooth. SYMPTOMS Symptoms of this condition include:  Severe pain in and around the infected tooth.  Swelling and redness around the infected tooth, in the mouth, or in the face.  Tenderness.  Pus drainage.  Bad breath.  Bitter taste in the mouth.  Difficulty swallowing.  Difficulty opening the mouth.  Nausea.  Vomiting.  Chills.  Swollen neck glands.  Fever. DIAGNOSIS This condition is diagnosed with examination of the infected tooth. During the exam, your dentist may tap on the infected tooth. Your dentist will also ask about your medical and dental history and may order X-rays. TREATMENT This condition is treated by eliminating the infection. This may be done with:  Antibiotic medicine.  A root canal. This may be performed to save the tooth.  Pulling (extracting) the tooth. This may also involve draining the abscess. This is done if the tooth cannot be saved. HOME CARE INSTRUCTIONS  Take medicines only as directed by your dentist.  If you were prescribed antibiotic medicine, finish all of it even if you start to feel better.  Rinse your mouth (gargle) often with salt water to relieve pain or swelling.  Do not drive or operate heavy machinery while taking pain medicine.  Do not apply heat to the outside of your mouth.  Keep all follow-up visits as directed by your dentist. This is important. SEEK MEDICAL CARE IF:  Your pain is worse and  is not helped by medicine. SEEK IMMEDIATE MEDICAL CARE IF:  You have a fever or chills.  Your symptoms suddenly get worse.  You have a very bad headache.  You have problems breathing or swallowing.  You have trouble opening your mouth.  You have swelling in your neck or around your eye.   This information is not intended to replace advice given to you by your health care provider. Make sure you discuss any questions you have with your health care provider.   Document Released: 05/02/2005 Document Revised: 09/16/2014 Document Reviewed: 04/29/2014 Elsevier Interactive Patient Education [employer].

## 2015-03-12 NOTE — ED Provider Notes (Signed)
[facility] Emergency Department Provider Note  ____________________________________________  Time seen: Approximately 5:44 PM  I have reviewed the triage vital signs and the nursing notes.   HISTORY  Chief Complaint Dental Problem    HPI Ann Bowen is a 59 y.o. female resents emergency department complaining of dental pain on the right lower jaw. She states that she had the first bicuspid on the right lower jaw removed a week ago and has had problems since. She states that she was seen by the orthodontist 3 days ago for same. She states that she has had some swelling in the area, some fevers and chills, mild nausea, and increasing pain to area. She states that she has been following on the recommendations by the orthodontist. States the pain is moderate to severe and nothing improves same.   Past Medical History  Diagnosis Date  . Shingles   . Arthritis   . Fibromyalgia   . Depression   . Borderline personality disorder     There are no active problems to display for this patient.   Past Surgical History  Procedure Laterality Date  . Gastric bypass    . Cholecystectomy    . Shoulder surgery      Current Outpatient Rx  Name  Route  Sig  Dispense  Refill  . clonazePAM (KLONOPIN) 1 MG tablet   Oral   Take 1 mg by mouth 2 (two) times daily.         Marland Kitchen HYDROcodone-acetaminophen (NORCO/VICODIN) 5-325 MG tablet   Oral   Take 1 tablet by mouth every 6 (six) hours as needed for moderate pain.         Marland Kitchen QUEtiapine (SEROQUEL) 100 MG tablet   Oral   Take 100 mg by mouth at bedtime.         Marland Kitchen amoxicillin-clavulanate (AUGMENTIN) 875-125 MG tablet   Oral   Take 1 tablet by mouth 2 (two) times daily.   14 tablet   0   . diazepam (VALIUM) 2 MG tablet   Oral   Take 1 tablet (2 mg total) by mouth every 8 (eight) hours as needed for anxiety.   30 tablet   0   . diphenhydrAMINE (BENADRYL) 25 mg capsule   Oral   Take 1 capsule (25 mg  total) by mouth every 4 (four) hours as needed.   30 capsule   0   . magic mouthwash w/lidocaine SOLN   Oral   Take 5 mLs by mouth 4 (four) times daily.   240 mL   0     Dispense in a 1/1/1/1 ratio   . naproxen (NAPROSYN) 500 MG tablet   Oral   Take 1 tablet (500 mg total) by mouth 2 (two) times daily with a meal.   60 tablet   2   . oxyCODONE-acetaminophen (ROXICET) 5-325 MG tablet   Oral   Take 1 tablet by mouth every 6 (six) hours as needed for severe pain.   10 tablet   0   . predniSONE (DELTASONE) 10 MG tablet   Oral   Take 1 tablet (10 mg total) by mouth daily. 6,5,4,3,2,1 six day taper   21 tablet   0     Allergies Wellbutrin  No family history on file.  Social History Social History  Substance Use Topics  . Smoking status: Never Smoker   . Smokeless tobacco: Never Used  . Alcohol Use: No    Review of Systems Constitutional: No fever/chills  Eyes: No visual changes. ENT: No sore throat. Endorses dental pain right lower jaw. Cardiovascular: Denies chest pain. Respiratory: Denies shortness of breath. Gastrointestinal: No abdominal pain.  No nausea, no vomiting.  No diarrhea.  No constipation. Genitourinary: Negative for dysuria. Musculoskeletal: Negative for back pain. Skin: Negative for rash. Neurological: Negative for headaches, focal weakness or numbness.  10-point ROS otherwise negative.  ____________________________________________   PHYSICAL EXAM:  VITAL SIGNS: ED Triage Vitals  Enc Vitals Group     BP 03/12/15 1720 152/73 mmHg     Pulse Rate 03/12/15 1720 72     Resp 03/12/15 1720 18     Temp 03/12/15 1720 97.8 F (36.6 C)     Temp Source 03/12/15 1720 Oral     SpO2 03/12/15 1720 100 %     Weight 03/12/15 1720 220 lb (99.791 kg)     Height 03/12/15 1720 5\' 3"  (1.6 m)     Head Cir --      Peak Flow --      Pain Score 03/12/15 1727 10     Pain Loc --      Pain Edu? --      Excl. in [place]? --     Constitutional: Alert and  oriented. Well appearing and in no acute distress. Eyes: Conjunctivae are normal. PERRL. EOMI. Head: Atraumatic. Nose: No congestion/rhinnorhea. Mouth/Throat: Mucous membranes are moist.  Oropharynx non-erythematous. The first bicuspid tooth on the right lower jaw has been surgically extracted. Mild erythema and edema surrounding socket. No bleeding noted. No abscess noted. Other dental abnormality noted. Neck: No stridor.   Hematological/Lymphatic/Immunilogical: No cervical lymphadenopathy. Cardiovascular: Normal rate, regular rhythm. Grossly normal heart sounds.  Good peripheral circulation. Respiratory: Normal respiratory effort.  No retractions. Lungs CTAB. Gastrointestinal: Soft and nontender. No distention. No abdominal bruits. No CVA tenderness. Musculoskeletal: No lower extremity tenderness nor edema.  No joint effusions. Neurologic:  Normal speech and language. No gross focal neurologic deficits are appreciated. No gait instability. Skin:  Skin is warm, dry and intact. No rash noted. Psychiatric: Mood and affect are normal. Speech and behavior are normal.  ____________________________________________   LABS (all labs ordered are listed, but only abnormal results are displayed)  Labs Reviewed - No data to display ____________________________________________  EKG   ____________________________________________  RADIOLOGY   ____________________________________________   PROCEDURES  Procedure(s) performed: None  Critical Care performed: No  ____________________________________________   INITIAL IMPRESSION / ASSESSMENT AND PLAN / ED COURSE  Pertinent labs & imaging results that were available during my care of the patient were reviewed by me and considered in my medical decision making (see chart for details).  Patient's history, symptoms, physical exam are taken and consideration for diagnosis. I advised the patient that her diagnosis is most consistent with mild  infection from dental extraction. Lyses understanding with same. I'll place the patient on Augmentin for antibiotic control of possible infection. The patient will also be given a limited quantity of oral narcotics. I am also prescribing magic mouthwash for symptomatically relief. The patient verbalizes understanding of the treatment plan and verbalizes compliance with same. The patient to follow up with orthodontist for any continued problems. ____________________________________________   FINAL CLINICAL IMPRESSION(S) / ED DIAGNOSES  Final diagnoses:  Dental infection  S/P tooth extraction, unspecified edentulism      Darletta Moll, PA-C 03/12/15 1818  Hinda Kehr, MD 03/12/15 2358

## 2015-03-12 NOTE — ED Notes (Signed)
Pt had right lower dental extraction last week.  Pt continues to have pain.

## 2015-03-16 ENCOUNTER — Emergency Department: Payer: PRIVATE HEALTH INSURANCE

## 2015-03-16 ENCOUNTER — Emergency Department
Admission: EM | Admit: 2015-03-16 | Discharge: 2015-03-16 | Disposition: A | Discharge status: Home / Self Care | Payer: PRIVATE HEALTH INSURANCE | Attending: Emergency Medicine | Admitting: Emergency Medicine

## 2015-03-16 ENCOUNTER — Encounter: Payer: Self-pay | Admitting: Emergency Medicine

## 2015-03-16 DIAGNOSIS — S60410A Abrasion of right index finger, initial encounter: Secondary | ICD-10-CM | POA: Insufficient documentation

## 2015-03-16 DIAGNOSIS — T148XXA Other injury of unspecified body region, initial encounter: Secondary | ICD-10-CM

## 2015-03-16 DIAGNOSIS — Z79899 Other long term (current) drug therapy: Secondary | ICD-10-CM | POA: Insufficient documentation

## 2015-03-16 DIAGNOSIS — Y9389 Activity, other specified: Secondary | ICD-10-CM | POA: Insufficient documentation

## 2015-03-16 DIAGNOSIS — W540XXA Bitten by dog, initial encounter: Secondary | ICD-10-CM | POA: Insufficient documentation

## 2015-03-16 DIAGNOSIS — S60414A Abrasion of right ring finger, initial encounter: Secondary | ICD-10-CM | POA: Insufficient documentation

## 2015-03-16 DIAGNOSIS — Z792 Long term (current) use of antibiotics: Secondary | ICD-10-CM | POA: Diagnosis not present

## 2015-03-16 DIAGNOSIS — S61451A Open bite of right hand, initial encounter: Secondary | ICD-10-CM | POA: Insufficient documentation

## 2015-03-16 DIAGNOSIS — Y998 Other external cause status: Secondary | ICD-10-CM | POA: Diagnosis not present

## 2015-03-16 DIAGNOSIS — S61452A Open bite of left hand, initial encounter: Secondary | ICD-10-CM | POA: Diagnosis not present

## 2015-03-16 DIAGNOSIS — S61236A Puncture wound without foreign body of right little finger without damage to nail, initial encounter: Secondary | ICD-10-CM | POA: Diagnosis not present

## 2015-03-16 DIAGNOSIS — Y9289 Other specified places as the place of occurrence of the external cause: Secondary | ICD-10-CM | POA: Insufficient documentation

## 2015-03-16 MED ORDER — AMOXICILLIN-POT CLAVULANATE 875-125 MG PO TABS: 1.0000 | ORAL_TABLET | Freq: Two times a day (BID) | ORAL | Status: DC

## 2015-03-16 MED ORDER — BACITRACIN ZINC 500 UNIT/GM EX OINT
1.0000 "application " | TOPICAL_OINTMENT | Freq: Two times a day (BID) | CUTANEOUS | Status: DC
  Administered 2015-03-16: 1 via TOPICAL
  Filled 2015-03-16: qty 0.9

## 2015-03-16 NOTE — ED Notes (Signed)
Will provide Sheriff number to pt on animal control reporting per call from [employer].   [phone]

## 2015-03-16 NOTE — ED Notes (Signed)
Contacted non-emergency sheriff [place], per [employer] for animal control services.

## 2015-03-16 NOTE — ED Notes (Signed)
States "I got in the middle of a dog fight tonight".  Fight between patient's little dog and sisters big dog.  Two puncture wounds to left 5th finger.  Bleeding controlled.  + CMS to digit.  Abrasions to left anterior forearm.  Bleeding controlled and sterile dressing applied.  Superficial abrasion vs puncture wounds to right third and fourth fingers-- bleeding controlled.  Band aids applied.

## 2015-03-16 NOTE — ED Provider Notes (Signed)
[facility] Emergency Department Provider Note  ____________________________________________  Time seen: Approximately 8:11 PM  I have reviewed the triage vital signs and the nursing notes.   HISTORY  Chief Complaint No chief complaint on file.    HPI Ann Bowen is a 59 y.o. female who presents to the emergency department for evaluation of bilateral hand pain after being bitten by dog. She was attempting to break up a dog fight between her dog and her sister's dog. There is a puncture wound on the fifth digit and abrasions to the fingers on the right hand as well. The dog bit her is up-to-date on its vaccinations.Patient is up-to-date on her tetanus shot.   Past Medical History  Diagnosis Date  . Shingles   . Arthritis   . Fibromyalgia   . Depression   . Borderline personality disorder     There are no active problems to display for this patient.   Past Surgical History  Procedure Laterality Date  . Gastric bypass    . Cholecystectomy    . Shoulder surgery      Current Outpatient Rx  Name  Route  Sig  Dispense  Refill  . amoxicillin-clavulanate (AUGMENTIN) 875-125 MG tablet   Oral   Take 1 tablet by mouth 2 (two) times daily.   20 tablet   0   . clonazePAM (KLONOPIN) 1 MG tablet   Oral   Take 1 mg by mouth 2 (two) times daily.         . diazepam (VALIUM) 2 MG tablet   Oral   Take 1 tablet (2 mg total) by mouth every 8 (eight) hours as needed for anxiety.   30 tablet   0   . diphenhydrAMINE (BENADRYL) 25 mg capsule   Oral   Take 1 capsule (25 mg total) by mouth every 4 (four) hours as needed.   30 capsule   0   . HYDROcodone-acetaminophen (NORCO/VICODIN) 5-325 MG tablet   Oral   Take 1 tablet by mouth every 6 (six) hours as needed for moderate pain.         . magic mouthwash w/lidocaine SOLN   Oral   Take 5 mLs by mouth 4 (four) times daily.   240 mL   0     Dispense in a 1/1/1/1 ratio   . naproxen  (NAPROSYN) 500 MG tablet   Oral   Take 1 tablet (500 mg total) by mouth 2 (two) times daily with a meal.   60 tablet   2   . oxyCODONE-acetaminophen (ROXICET) 5-325 MG tablet   Oral   Take 1 tablet by mouth every 6 (six) hours as needed for severe pain.   10 tablet   0   . predniSONE (DELTASONE) 10 MG tablet   Oral   Take 1 tablet (10 mg total) by mouth daily. 6,5,4,3,2,1 six day taper   21 tablet   0   . QUEtiapine (SEROQUEL) 100 MG tablet   Oral   Take 100 mg by mouth at bedtime.           Allergies Wellbutrin  No family history on file.  Social History Social History  Substance Use Topics  . Smoking status: Never Smoker   . Smokeless tobacco: Never Used  . Alcohol Use: No    Review of Systems   Constitutional: No fever/chills Eyes: No visual changes. ENT: No congestion or rhinorrhea Cardiovascular: Denies chest pain. Respiratory: Denies shortness of breath. Gastrointestinal: No abdominal  pain.  No nausea, no vomiting.  No diarrhea.  No constipation. Genitourinary: Negative for dysuria. Musculoskeletal: Negative for back pain. Skin: Dog bite to bilateral hands. Neurological: Negative for headaches, focal weakness or numbness.  10-point ROS otherwise negative.  ____________________________________________   PHYSICAL EXAM:  VITAL SIGNS: ED Triage Vitals  Enc Vitals Group     BP 03/16/15 1948 149/78 mmHg     Pulse Rate 03/16/15 1948 70     Resp 03/16/15 1948 18     Temp 03/16/15 1948 98.2 F (36.8 C)     Temp src --      SpO2 03/16/15 1948 98 %     Weight 03/16/15 1948 220 lb (99.791 kg)     Height 03/16/15 1948 5\' 3"  (1.6 m)     Head Cir --      Peak Flow --      Pain Score 03/16/15 1949 9     Pain Loc --      Pain Edu? --      Excl. in [place]? --     Constitutional: Alert and oriented. Well appearing and in no acute distress. Eyes: Conjunctivae are normal. PERRL. EOMI. Head: Atraumatic. Nose: No congestion/rhinnorhea. Mouth/Throat:  Mucous membranes are moist.  Oropharynx non-erythematous. No oral lesions. Neck: No stridor. Cardiovascular: Normal rate, regular rhythm.  Good peripheral circulation. Respiratory: Normal respiratory effort.  No retractions. Lungs CTAB. Gastrointestinal: Soft and nontender. No distention. No abdominal bruits.  Musculoskeletal: No lower extremity tenderness nor edema.  No joint effusions. Full range of motion of all fingers. Neurologic:  Normal speech and language. No gross focal neurologic deficits are appreciated. Speech is normal. No gait instability. Skin:; Negative for petechiae.  Puncture wound noted to the fifth digit of the left hand. Abrasions noted to the second third and fourth digits of the right hand. Psychiatric: Mood and affect are normal. Speech and behavior are normal.  ____________________________________________   LABS (all labs ordered are listed, but only abnormal results are displayed)  Labs Reviewed - No data to display ____________________________________________  EKG   ____________________________________________  RADIOLOGY  Bilateral hand films are negative for acute abnormality. ____________________________________________   PROCEDURES  Procedure(s) performed:  Hands were soaked in Betadine and normal saline. Bacitracin was then applied under sterile dressing. ____________________________________________   INITIAL IMPRESSION / ASSESSMENT AND PLAN / ED COURSE  Pertinent labs & imaging results that were available during my care of the patient were reviewed by me and considered in my medical decision making (see chart for details).  Patient is currently taking Augmentin due to dental infection. A number of days of Augmentin will be extended. She was instructed to have a provider reevaluate her in 2 days. She was instructed to return to the emergency department at any time for symptoms of concern. ____________________________________________   FINAL  CLINICAL IMPRESSION(S) / ED DIAGNOSES  Final diagnoses:  Dog bite of hand, right, initial encounter  Dog bite of hand, left, initial encounter  Puncture wound       Victorino Dike, FNP 03/16/15 2213  Nance Pear, MD 03/16/15 2241

## 2015-03-16 NOTE — ED Notes (Signed)
Pt. Going home with family. 

## 2015-03-16 NOTE — Discharge Instructions (Signed)

## 2015-03-16 NOTE — ED Notes (Signed)
Wounds soaked in batadine and sterile water. Bacitracin applied to all puncture wounds and scratches.

## 2015-03-20 ENCOUNTER — Emergency Department
Admission: EM | Admit: 2015-03-20 | Discharge: 2015-03-20 | Disposition: A | Discharge status: Home / Self Care | Payer: PRIVATE HEALTH INSURANCE | Attending: Emergency Medicine | Admitting: Emergency Medicine

## 2015-03-20 ENCOUNTER — Emergency Department: Payer: PRIVATE HEALTH INSURANCE

## 2015-03-20 ENCOUNTER — Encounter: Payer: Self-pay | Admitting: *Deleted

## 2015-03-20 DIAGNOSIS — S61259D Open bite of unspecified finger without damage to nail, subsequent encounter: Secondary | ICD-10-CM

## 2015-03-20 DIAGNOSIS — W540XXA Bitten by dog, initial encounter: Secondary | ICD-10-CM

## 2015-03-20 DIAGNOSIS — W540XXD Bitten by dog, subsequent encounter: Secondary | ICD-10-CM | POA: Diagnosis not present

## 2015-03-20 DIAGNOSIS — Z792 Long term (current) use of antibiotics: Secondary | ICD-10-CM | POA: Diagnosis not present

## 2015-03-20 DIAGNOSIS — Z79899 Other long term (current) drug therapy: Secondary | ICD-10-CM | POA: Insufficient documentation

## 2015-03-20 DIAGNOSIS — S61255D Open bite of left ring finger without damage to nail, subsequent encounter: Secondary | ICD-10-CM | POA: Insufficient documentation

## 2015-03-20 DIAGNOSIS — S60042D Contusion of left ring finger without damage to nail, subsequent encounter: Secondary | ICD-10-CM | POA: Diagnosis not present

## 2015-03-20 DIAGNOSIS — S61411D Laceration without foreign body of right hand, subsequent encounter: Secondary | ICD-10-CM | POA: Insufficient documentation

## 2015-03-20 DIAGNOSIS — Z791 Long term (current) use of non-steroidal anti-inflammatories (NSAID): Secondary | ICD-10-CM | POA: Insufficient documentation

## 2015-03-20 DIAGNOSIS — Z4800 Encounter for change or removal of nonsurgical wound dressing: Secondary | ICD-10-CM | POA: Diagnosis present

## 2015-03-20 LAB — CBC WITH DIFFERENTIAL/PLATELET
BASOS ABS: 0 10*3/uL (ref 0–0.1)
BASOS PCT: 0 %
EOS ABS: 0.2 10*3/uL (ref 0–0.7)
Eosinophils Relative: 3 %
HEMATOCRIT: 31.2 % — AB (ref 35.0–47.0)
HEMOGLOBIN: 10 g/dL — AB (ref 12.0–16.0)
Lymphocytes Relative: 34 %
Lymphs Abs: 2 10*3/uL (ref 1.0–3.6)
MCH: 24.8 pg — ABNORMAL LOW (ref 26.0–34.0)
MCHC: 32.1 g/dL (ref 32.0–36.0)
MCV: 77.2 fL — ABNORMAL LOW (ref 80.0–100.0)
MONOS PCT: 9 %
Monocytes Absolute: 0.5 10*3/uL (ref 0.2–0.9)
NEUTROS ABS: 3.2 10*3/uL (ref 1.4–6.5)
NEUTROS PCT: 54 %
Platelets: 266 10*3/uL (ref 150–440)
RBC: 4.04 MIL/uL (ref 3.80–5.20)
RDW: 16.1 % — ABNORMAL HIGH (ref 11.5–14.5)
WBC: 5.9 10*3/uL (ref 3.6–11.0)

## 2015-03-20 NOTE — ED Provider Notes (Signed)
Any bony changes. Negative for fracture. [facility] Emergency Department Provider Note  ____________________________________________  Time seen: Approximately 1:28 PM  I have reviewed the triage vital signs and the nursing notes.   HISTORY  Chief Complaint Wound Check    HPI Ann Bowen is a 59 y.o. female for evaluation of dog bite on 5 days ago, October 31. Patient states that finger seems to be lthe same in relation to swelling and pain. Has noticed some increased bruising.  Past Medical History  Diagnosis Date  . Shingles   . Arthritis   . Fibromyalgia   . Depression   . Borderline personality disorder     There are no active problems to display for this patient.   Past Surgical History  Procedure Laterality Date  . Gastric bypass    . Cholecystectomy    . Shoulder surgery      Current Outpatient Rx  Name  Route  Sig  Dispense  Refill  . amoxicillin-clavulanate (AUGMENTIN) 875-125 MG tablet   Oral   Take 1 tablet by mouth 2 (two) times daily.   20 tablet   0   . clonazePAM (KLONOPIN) 1 MG tablet   Oral   Take 1 mg by mouth 2 (two) times daily.         . diazepam (VALIUM) 2 MG tablet   Oral   Take 1 tablet (2 mg total) by mouth every 8 (eight) hours as needed for anxiety.   30 tablet   0   . diphenhydrAMINE (BENADRYL) 25 mg capsule   Oral   Take 1 capsule (25 mg total) by mouth every 4 (four) hours as needed.   30 capsule   0   . HYDROcodone-acetaminophen (NORCO/VICODIN) 5-325 MG tablet   Oral   Take 1 tablet by mouth every 6 (six) hours as needed for moderate pain.         . magic mouthwash w/lidocaine SOLN   Oral   Take 5 mLs by mouth 4 (four) times daily.   240 mL   0     Dispense in a 1/1/1/1 ratio   . naproxen (NAPROSYN) 500 MG tablet   Oral   Take 1 tablet (500 mg total) by mouth 2 (two) times daily with a meal.   60 tablet   2   . oxyCODONE-acetaminophen (ROXICET) 5-325 MG tablet   Oral  Take 1 tablet by mouth every 6 (six) hours as needed for severe pain.   10 tablet   0   . predniSONE (DELTASONE) 10 MG tablet   Oral   Take 1 tablet (10 mg total) by mouth daily. 6,5,4,3,2,1 six day taper   21 tablet   0   . QUEtiapine (SEROQUEL) 100 MG tablet   Oral   Take 100 mg by mouth at bedtime.           Allergies Wellbutrin  No family history on file.  Social History Social History  Substance Use Topics  . Smoking status: Never Smoker   . Smokeless tobacco: Never Used  . Alcohol Use: No    Review of Systems Constitutional: No fever/chills Eyes: No visual changes. ENT: No sore throat. Cardiovascular: Denies chest pain. Respiratory: Denies shortness of breath. Gastrointestinal: No abdominal pain.  No nausea, no vomiting.  No diarrhea.  No constipation. Genitourinary: Negative for dysuria. Musculoskeletal: Negative for back pain. Skin: Positive for healing dog bite with swelling and tenderness to both hands left worse than right. Neurological: Negative  for headaches, focal weakness or numbness.  10-point ROS otherwise negative.  ____________________________________________   PHYSICAL EXAM:  VITAL SIGNS: ED Triage Vitals  Enc Vitals Group     BP 03/20/15 1229 141/62 mmHg     Pulse Rate 03/20/15 1229 81     Resp 03/20/15 1229 20     Temp 03/20/15 1229 98.2 F (36.8 C)     Temp Source 03/20/15 1229 Oral     SpO2 03/20/15 1229 98 %     Weight 03/20/15 1229 220 lb (99.791 kg)     Height 03/20/15 1229 5\' 3"  (1.6 m)     Head Cir --      Peak Flow --      Pain Score --      Pain Loc --      Pain Edu? --      Excl. in [place]? --     Constitutional: Alert and oriented. Well appearing and in no acute distress. Cardiovascular: Normal rate, regular rhythm. Grossly normal heart sounds.  Good peripheral circulation. Respiratory: Normal respiratory effort.  No retractions. Lungs CTAB. Gastrointestinal: Soft and nontender. No distention. No abdominal bruits.  No CVA tenderness. Musculoskeletal: No lower extremity tenderness nor edema.  No joint effusions. Neurologic:  Normal speech and language. No gross focal neurologic deficits are appreciated. No gait instability. Skin:  Mild ecchymosis noted around the fourth digit on the left hand and around some of the lacerations of the right hand. No evidence of pustular section minimal erythema and mild edema noted. Psychiatric: Mood and affect are normal. Speech and behavior are normal.  ____________________________________________   LABS (all labs ordered are listed, but only abnormal results are displayed)  Labs Reviewed  CBC WITH DIFFERENTIAL/PLATELET - Abnormal; Notable for the following:    Hemoglobin 10.0 (*)    HCT 31.2 (*)    MCV 77.2 (*)    MCH 24.8 (*)    RDW 16.1 (*)    All other components within normal limits   ____________________________________________   RADIOLOGY  Negative for any bony changes, negative for fracture. ____________________________________________   PROCEDURES  Procedure(s) performed: None  Critical Care performed: No  ____________________________________________   INITIAL IMPRESSION / ASSESSMENT AND PLAN / ED COURSE  Pertinent labs & imaging results that were available during my care of the patient were reviewed by me and considered in my medical decision making (see chart for details).  Dog bite wound check. Fill Augmentin prescription as instructed. Return to the ED and 24-48 hours with any worsening symptomology. ____________________________________________   FINAL CLINICAL IMPRESSION(S) / ED DIAGNOSES  Final diagnoses:  Dog bite  Dog bite of finger, subsequent encounter      Arlyss Repress, PA-C 03/20/15 1809  Nance Pear, MD 03/24/15 1210

## 2015-03-20 NOTE — Discharge Instructions (Signed)
Return to ER with ANY worsening symptoms.

## 2015-03-20 NOTE — ED Notes (Signed)
Seen here for dog bite.  Area worse.  Says pcp could not see her.

## 2015-03-20 NOTE — ED Notes (Signed)
Pt was bit by family dog Monday, pt was seen, pt has multiple bites on bilateral fingers, pt reports redness and increased pain, swelling

## 2015-08-10 ENCOUNTER — Encounter: Payer: Self-pay | Admitting: *Deleted

## 2015-08-10 ENCOUNTER — Emergency Department
Admission: EM | Admit: 2015-08-10 | Discharge: 2015-08-11 | Disposition: A | Discharge status: Other Acute Inpatient Hospital | Payer: Medicaid Other | Attending: Emergency Medicine | Admitting: Emergency Medicine

## 2015-08-10 DIAGNOSIS — N390 Urinary tract infection, site not specified: Secondary | ICD-10-CM | POA: Insufficient documentation

## 2015-08-10 DIAGNOSIS — Y9389 Activity, other specified: Secondary | ICD-10-CM | POA: Diagnosis not present

## 2015-08-10 DIAGNOSIS — F331 Major depressive disorder, recurrent, moderate: Secondary | ICD-10-CM | POA: Diagnosis not present

## 2015-08-10 DIAGNOSIS — Y9289 Other specified places as the place of occurrence of the external cause: Secondary | ICD-10-CM | POA: Diagnosis not present

## 2015-08-10 DIAGNOSIS — T424X2A Poisoning by benzodiazepines, intentional self-harm, initial encounter: Secondary | ICD-10-CM | POA: Diagnosis present

## 2015-08-10 DIAGNOSIS — Z792 Long term (current) use of antibiotics: Secondary | ICD-10-CM | POA: Insufficient documentation

## 2015-08-10 DIAGNOSIS — F17210 Nicotine dependence, cigarettes, uncomplicated: Secondary | ICD-10-CM | POA: Insufficient documentation

## 2015-08-10 DIAGNOSIS — Z79899 Other long term (current) drug therapy: Secondary | ICD-10-CM | POA: Insufficient documentation

## 2015-08-10 DIAGNOSIS — Y998 Other external cause status: Secondary | ICD-10-CM | POA: Insufficient documentation

## 2015-08-10 DIAGNOSIS — T402X2A Poisoning by other opioids, intentional self-harm, initial encounter: Secondary | ICD-10-CM | POA: Insufficient documentation

## 2015-08-10 DIAGNOSIS — F339 Major depressive disorder, recurrent, unspecified: Secondary | ICD-10-CM

## 2015-08-10 DIAGNOSIS — T391X2A Poisoning by 4-Aminophenol derivatives, intentional self-harm, initial encounter: Secondary | ICD-10-CM | POA: Diagnosis not present

## 2015-08-10 DIAGNOSIS — F603 Borderline personality disorder: Secondary | ICD-10-CM

## 2015-08-10 DIAGNOSIS — M1990 Unspecified osteoarthritis, unspecified site: Secondary | ICD-10-CM

## 2015-08-10 DIAGNOSIS — F329 Major depressive disorder, single episode, unspecified: Secondary | ICD-10-CM | POA: Insufficient documentation

## 2015-08-10 DIAGNOSIS — Z791 Long term (current) use of non-steroidal anti-inflammatories (NSAID): Secondary | ICD-10-CM | POA: Diagnosis not present

## 2015-08-10 DIAGNOSIS — F32A Depression, unspecified: Secondary | ICD-10-CM

## 2015-08-10 DIAGNOSIS — F1310 Sedative, hypnotic or anxiolytic abuse, uncomplicated: Secondary | ICD-10-CM | POA: Insufficient documentation

## 2015-08-10 DIAGNOSIS — F17200 Nicotine dependence, unspecified, uncomplicated: Secondary | ICD-10-CM

## 2015-08-10 DIAGNOSIS — M797 Fibromyalgia: Secondary | ICD-10-CM

## 2015-08-10 LAB — CBC WITH DIFFERENTIAL/PLATELET
BASOS ABS: 0 10*3/uL (ref 0–0.1)
Basophils Relative: 1 %
EOS ABS: 0.1 10*3/uL (ref 0–0.7)
EOS PCT: 1 %
HCT: 30.5 % — ABNORMAL LOW (ref 35.0–47.0)
Hemoglobin: 10 g/dL — ABNORMAL LOW (ref 12.0–16.0)
Lymphocytes Relative: 31 %
Lymphs Abs: 1.6 10*3/uL (ref 1.0–3.6)
MCH: 24.3 pg — AB (ref 26.0–34.0)
MCHC: 32.8 g/dL (ref 32.0–36.0)
MCV: 74.1 fL — ABNORMAL LOW (ref 80.0–100.0)
MONO ABS: 0.4 10*3/uL (ref 0.2–0.9)
Monocytes Relative: 7 %
Neutro Abs: 3.1 10*3/uL (ref 1.4–6.5)
Neutrophils Relative %: 60 %
PLATELETS: 284 10*3/uL (ref 150–440)
RBC: 4.12 MIL/uL (ref 3.80–5.20)
RDW: 16.7 % — AB (ref 11.5–14.5)
WBC: 5.2 10*3/uL (ref 3.6–11.0)

## 2015-08-10 LAB — URINALYSIS COMPLETE WITH MICROSCOPIC (ARMC ONLY)
BILIRUBIN URINE: NEGATIVE
Glucose, UA: NEGATIVE mg/dL
Hgb urine dipstick: NEGATIVE
KETONES UR: NEGATIVE mg/dL
Nitrite: POSITIVE — AB
Protein, ur: NEGATIVE mg/dL
RBC / HPF: NONE SEEN RBC/hpf (ref 0–5)
SPECIFIC GRAVITY, URINE: 1.003 — AB (ref 1.005–1.030)
pH: 7 (ref 5.0–8.0)

## 2015-08-10 LAB — COMPREHENSIVE METABOLIC PANEL
ALT: 16 U/L (ref 14–54)
ANION GAP: 3 — AB (ref 5–15)
AST: 36 U/L (ref 15–41)
Albumin: 3.5 g/dL (ref 3.5–5.0)
Alkaline Phosphatase: 82 U/L (ref 38–126)
BILIRUBIN TOTAL: 1.3 mg/dL — AB (ref 0.3–1.2)
BUN: 7 mg/dL (ref 6–20)
CO2: 25 mmol/L (ref 22–32)
Calcium: 8.7 mg/dL — ABNORMAL LOW (ref 8.9–10.3)
Chloride: 113 mmol/L — ABNORMAL HIGH (ref 101–111)
Creatinine, Ser: 0.74 mg/dL (ref 0.44–1.00)
Glucose, Bld: 101 mg/dL — ABNORMAL HIGH (ref 65–99)
POTASSIUM: 4.6 mmol/L (ref 3.5–5.1)
Sodium: 141 mmol/L (ref 135–145)
TOTAL PROTEIN: 6.4 g/dL — AB (ref 6.5–8.1)

## 2015-08-10 LAB — URINE DRUG SCREEN, QUALITATIVE (ARMC ONLY)
AMPHETAMINES, UR SCREEN: NOT DETECTED
Barbiturates, Ur Screen: NOT DETECTED
Benzodiazepine, Ur Scrn: POSITIVE — AB
COCAINE METABOLITE, UR ~~LOC~~: NOT DETECTED
Cannabinoid 50 Ng, Ur ~~LOC~~: NOT DETECTED
MDMA (ECSTASY) UR SCREEN: NOT DETECTED
Methadone Scn, Ur: NOT DETECTED
OPIATE, UR SCREEN: NOT DETECTED
PHENCYCLIDINE (PCP) UR S: NOT DETECTED
Tricyclic, Ur Screen: POSITIVE — AB

## 2015-08-10 LAB — ETHANOL

## 2015-08-10 LAB — ACETAMINOPHEN LEVEL: Acetaminophen (Tylenol), Serum: <10 ug/mL — ABNORMAL LOW (ref 10–30)

## 2015-08-10 LAB — SALICYLATE LEVEL

## 2015-08-10 MED ORDER — ROPINIROLE HCL 1 MG PO TABS
4.0000 mg | ORAL_TABLET | Freq: Every day | ORAL | Status: DC
  Administered 2015-08-10: 4 mg via ORAL
  Filled 2015-08-10: qty 4

## 2015-08-10 MED ORDER — QUETIAPINE FUMARATE 300 MG PO TABS
300.0000 mg | ORAL_TABLET | Freq: Every day | ORAL | Status: DC
  Filled 2015-08-10: qty 1

## 2015-08-10 MED ORDER — OXYCODONE-ACETAMINOPHEN 5-325 MG PO TABS
1.0000 | ORAL_TABLET | Freq: Four times a day (QID) | ORAL | Status: DC | PRN
  Administered 2015-08-10 – 2015-08-11 (×2): 1 via ORAL
  Filled 2015-08-10 (×2): qty 1

## 2015-08-10 MED ORDER — FOSFOMYCIN TROMETHAMINE 3 G PO PACK
3.0000 g | PACK | Freq: Once | ORAL | Status: AC
  Administered 2015-08-10: 3 g via ORAL
  Filled 2015-08-10: qty 3

## 2015-08-10 NOTE — ED Provider Notes (Signed)
[facility] Emergency Department Provider Note  ____________________________________________  Time seen: 12:55 PM on arrival by EMS  I have reviewed the triage vital signs and the nursing notes.   HISTORY  Chief Complaint Drug Overdose    HPI Ann Bowen is a 60 y.o. female brought to the ED due to an overdose. She states that she's been depressed recently and feeling suicidal because she is about to be evicted from her home. This morning she was feeling so hopeless that she took 40 of her 1 mg Klonopin tablets in a suicide attempt at 10:00 AM today.. She also took 2 Percocets. Denies any other overdose or misuse of medications.  No chest pain shortness of breath abdominal pain nausea vomiting or diarrhea. Denies Any other complaints except for chronic leg spasms.     Past Medical History  Diagnosis Date  . Shingles   . Arthritis   . Fibromyalgia   . Depression   . Borderline personality disorder      There are no active problems to display for this patient.    Past Surgical History  Procedure Laterality Date  . Gastric bypass    . Cholecystectomy    . Shoulder surgery       Current Outpatient Rx  Name  Route  Sig  Dispense  Refill  . amoxicillin-clavulanate (AUGMENTIN) 875-125 MG tablet   Oral   Take 1 tablet by mouth 2 (two) times daily.   20 tablet   0   . clonazePAM (KLONOPIN) 1 MG tablet   Oral   Take 1 mg by mouth 2 (two) times daily.         . diazepam (VALIUM) 2 MG tablet   Oral   Take 1 tablet (2 mg total) by mouth every 8 (eight) hours as needed for anxiety.   30 tablet   0   . diphenhydrAMINE (BENADRYL) 25 mg capsule   Oral   Take 1 capsule (25 mg total) by mouth every 4 (four) hours as needed.   30 capsule   0   . HYDROcodone-acetaminophen (NORCO/VICODIN) 5-325 MG tablet   Oral   Take 1 tablet by mouth every 6 (six) hours as needed for moderate pain.         . magic mouthwash w/lidocaine SOLN    Oral   Take 5 mLs by mouth 4 (four) times daily.   240 mL   0     Dispense in a 1/1/1/1 ratio   . naproxen (NAPROSYN) 500 MG tablet   Oral   Take 1 tablet (500 mg total) by mouth 2 (two) times daily with a meal.   60 tablet   2   . oxyCODONE-acetaminophen (ROXICET) 5-325 MG tablet   Oral   Take 1 tablet by mouth every 6 (six) hours as needed for severe pain.   10 tablet   0   . predniSONE (DELTASONE) 10 MG tablet   Oral   Take 1 tablet (10 mg total) by mouth daily. 6,5,4,3,2,1 six day taper   21 tablet   0   . QUEtiapine (SEROQUEL) 100 MG tablet   Oral   Take 100 mg by mouth at bedtime.            Allergies Wellbutrin   History reviewed. No pertinent family history.  Social History Social History  Substance Use Topics  . Smoking status: Current Every Day Smoker -- 0.50 packs/day    Types: Cigarettes  . Smokeless tobacco: Never Used  .  Alcohol Use: No    Review of Systems  Constitutional:   No fever or chills. No weight changes Eyes:   No vision changes.  ENT:   No sore throat. No rhinorrhea. Cardiovascular:   No chest pain. Respiratory:   No dyspnea or cough. Gastrointestinal:   Negative for abdominal pain, vomiting and diarrhea.  No BRBPR or melena. Genitourinary:   Negative for dysuria or difficulty urinating. Musculoskeletal:   Negative for focal pain or swelling Skin:   Negative for rash. Neurological:   Negative for headaches, focal weakness or numbness.  10-point ROS otherwise negative.  ____________________________________________   PHYSICAL EXAM:  VITAL SIGNS: ED Triage Vitals  Enc Vitals Group     BP 08/10/15 1309 142/71 mmHg     Pulse Rate 08/10/15 1309 76     Resp 08/10/15 1309 20     Temp 08/10/15 1309 97.8 F (36.6 C)     Temp Source 08/10/15 1309 Oral     SpO2 08/10/15 1309 97 %     Weight 08/10/15 1309 220 lb (99.791 kg)     Height 08/10/15 1309 5\' 3"  (1.6 m)     Head Cir --      Peak Flow --      Pain Score --       Pain Loc --      Pain Edu? --      Excl. in [place]? --     Vital signs reviewed, nursing assessments reviewed.   Constitutional:   Alert and oriented. Well appearing and in no distress. Eyes:   No scleral icterus. No conjunctival pallor. PERRL. EOMI ENT   Head:   Normocephalic and atraumatic.   Nose:   No congestion/rhinnorhea. No septal hematoma   Mouth/Throat:   MMM, no pharyngeal erythema. No peritonsillar mass.    Neck:   No stridor. No SubQ emphysema. No meningismus. Hematological/Lymphatic/Immunilogical:   No cervical lymphadenopathy. Cardiovascular:   RRR. Symmetric bilateral radial and DP pulses.  No murmurs.  Respiratory:   Normal respiratory effort without tachypnea nor retractions. Breath sounds are clear and equal bilaterally. No wheezes/rales/rhonchi. Gastrointestinal:   Soft and nontender. Non distended. There is no CVA tenderness.  No rebound, rigidity, or guarding. Genitourinary:   deferred Musculoskeletal:   Nontender with normal range of motion in all extremities. No joint effusions.  No lower extremity tenderness.  No edema. Neurologic:   Normal speech and language.  CN 2-10 normal. Motor grossly intact. No gross focal neurologic deficits are appreciated.  Skin:    Skin is warm, dry and intact. No rash noted.  No petechiae, purpura, or bullae. Psychiatric:   Mood and affect are normal. ____________________________________________    LABS (pertinent positives/negatives) (all labs ordered are listed, but only abnormal results are displayed) Labs Reviewed  ACETAMINOPHEN LEVEL - Abnormal; Notable for the following:    Acetaminophen (Tylenol), Serum <10 (*)    All other components within normal limits  CBC WITH DIFFERENTIAL/PLATELET - Abnormal; Notable for the following:    Hemoglobin 10.0 (*)    HCT 30.5 (*)    MCV 74.1 (*)    MCH 24.3 (*)    RDW 16.7 (*)    All other components within normal limits  URINALYSIS COMPLETEWITH MICROSCOPIC ([facility] ONLY) -  Abnormal; Notable for the following:    Color, Urine YELLOW (*)    APPearance HAZY (*)    Specific Gravity, Urine 1.003 (*)    Nitrite POSITIVE (*)    Leukocytes, UA 2+ (*)  Bacteria, UA RARE (*)    Squamous Epithelial / LPF 0-5 (*)    All other components within normal limits  URINE DRUG SCREEN, QUALITATIVE ([facility] ONLY) - Abnormal; Notable for the following:    Tricyclic, Ur Screen POSITIVE (*)    Benzodiazepine, Ur Scrn POSITIVE (*)    All other components within normal limits  SALICYLATE LEVEL  ETHANOL  COMPREHENSIVE METABOLIC PANEL   ____________________________________________   EKG    ____________________________________________    RADIOLOGY    ____________________________________________   PROCEDURES   ____________________________________________   INITIAL IMPRESSION / ASSESSMENT AND PLAN / ED COURSE  Pertinent labs & imaging results that were available during my care of the patient were reviewed by me and considered in my medical decision making (see chart for details).  Patient presents with depression suicidality and a suicide attempt by benzodiazepine overdose. Monitored in the emergency department for any signs of toxicity and CNS or respiratory depression. Workup findings a nitrite positive urinary tract infection. We'll send a culture and give the patient fosfomycin.  At 3:00 PM at an interval of 5 hours after the ingestion, dental status is still clear. She is awake alert oriented and appropriately interactive and lucid. Normal spontaneous respirations with unremarkable vital signs. Patient medically stable for transfer to the behavioral health unit awaiting psychiatry evaluation. Patient has been placed under an involuntary commitment petition.     ____________________________________________   FINAL CLINICAL IMPRESSION(S) / ED DIAGNOSES  Final diagnoses:  Benzodiazepine overdose, intentional self-harm, initial encounter ([place])  Depression   Unspecified urinary tract infection    Carrie Mew, MD 08/10/15 1501

## 2015-08-10 NOTE — ED Notes (Signed)
Patient awake, alert, and oriented. She now says she is not suicidal as she has called her boyfriend and "everything is all right." Maintained on 15 minute checks and observation by security camera for safety.

## 2015-08-10 NOTE — ED Notes (Addendum)
Pt arrived to ED via EMS after intentional OD of 1 mg strength klonopin x 40 along with percocet's x 2. Per EMS pt AAOx4 upon arrival. Upon arrival to ED pt is AAO4, states did this intentionally due to being kicked out of sisters home this past week. Pt with hx of previous beh health admissions, bipolar and depression. MD Joni Fears at bedside upon arrival, pt changed, belongings secured at this time.

## 2015-08-10 NOTE — Consult Note (Addendum)
[facility] Consult   Reason for Consult:  Suicidality Referring Physician:  ER Patient Identification: Ann Bowen MRN:  [id number] Principal Diagnosis: MDD (major depressive disorder), recurrent episode [facility]) Diagnosis:   Patient Active Problem List   Diagnosis Date Noted  . Borderline personality disorder [F60.3] 08/10/2015  . MDD (major depressive disorder), recurrent episode ([place]) [F33.9] 08/10/2015  . Fibromyalgia [M79.7] 08/10/2015  . Osteoarthritis [M19.90] 08/10/2015  . Tobacco use disorder [F17.200] 08/10/2015    Total Time spent with patient: 1 hour  Subjective:   Ann Bowen is a 60 y.o. female patient admitted with s/p OD.  HPI:     The patient is a 60 y.o. WF with MDD, borderline personality d/o, h/o fibromyalgia and MDD recurrent    brought to the ED due to an overdose. She states that she's been depressed recently and feeling suicidal because she is about to be evicted from her home. This morning she was feeling so hopeless that she took 40 of her 1 mg Klonopin tablets in a suicide attempt at 10:00 AM today. She also took 2 Percocets.   No evidence of drowsiness, confusion, or sedation. Patient is completely alert and awake. Walking around without difficulty which is not consistent with an overdose on 40 clonazepam.  Urine toxicology is positive for benzodiazepines, TCAs (patient is on Seroquel), negative for opiates, alcohol level is below detection limit.  Patient said for the last year she has been living with her sister. They have not been getting along and her sister have notify her a while back t that she needed to move out. Today was the last date she had to move out of the house. The patient however states that she wasn't ready to move out as she has been sick.  She also felt that today was not a good day because it was raining.  She also thought that the sister had changed her mind because she had been acting real nice to  her  Patient has received a 35 b (from her sister) order which was served while she was in the emergency department.   Patient says she wants to be discharged by to her sister's house. She tells me that the [place] her 1 more week, therefore she could return to her sister house and get ready to move to an apartment.  Says she has paid the deposit already.  She says that after taking the 40 tablets of clonazepam she was not even sedated or tired. She does not want to be hospitalized. She does not feel the medications need to be adjusted. She says if she is hospitalized she might lose the chance of moving into the  Apartment.  At this time patient is denying suicidal history, homicidality or having auditory or visual hallucinations.  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 Ms. Adamek took this job loss very personally and has not been able to work in a sustained fashion since that time. Prior to the onset of fibromyalgia she tried cymbalta for depression though was unsuccessful and amitriptyline for which she cannot recall its effects;  Anger management issues: has broken 4-5 cell phones in anger, thrown antiques and plates, and been extremely hostile to people including her own daughter Earnest Bailey such that she is now estranged from her. As noted she has had short fuse and outbursts (e.g.  said "Fuck you Simona Huh" to her pastor who tried to joke with her about her missing tooth) which seem to support a non-mania explanation for her temper issues over the years. In 2016 she was in the hospital twice; she describes the first time she was cutting her wrist in context of SI related to [place] not inviting her to wedding, hospitalized for 3 days. The second time was the day after Holly's wedding, she saw pictures of the wedding and was upset, exacerbated by not being thanked for making her  a quilt, SI by not SA, self-referred.     Substance Abuse: Tobacco: Denied Alcohol: Denied Illicit Drugs: Denied    Past Psychiatric History: She denies frank manic episodes although has had unusual behaviors such as staying in [place] and getting front row tickets for [employer] for 14 days in a row. Also, she was described by sister as having a bad temper including being very irritable and verbally abusive to people, although this does not occur in context of mania/hypomania.  Inpatient: 4-5 x for SI/SA, first time 2005 Outpatient: [facility] in [place].  Dr. Wylene Simmer at [facility] in the past.  Seeing now a psychiatrist with [facility] Previous diagnoses: Depression, bipolar dx Previously treated with Says she has tried multiple drugs, including several SSRI:s, Wellbutrin (caused a rash) and Venlafaxine, none of which have worked. Past Suicidal Ideation: OD on Ambien, Xanax, and Vicodin in 2010, requiring hospitalization. Past Violent/Homicidal Ideation: Denied   Medication History: lyrica -- ineffective and sedating. Hydrocodone -- effective. abilify -- "made me crazy". No lithium. Lamictal -- reports no benefit. seroquel -- 50 mg was too strong, too sedating. fetzima -- no benefit. effexor XR -- remotely, can't recall. latuda -- stopped due to nausea. remeron -- weight gain when increased from 15-30; remeron and tegretol did not benefit her psychiatrically (was hospitalized twice).   Procedural History: ECT unilateral -- beneficial  Risk to Self: Suicidal Ideation: No-Not Currently/Within Last 6 Months Suicidal Intent: No-Not Currently/Within Last 6 Months Is patient at risk for suicide?: Yes Suicidal Plan?: Yes-Currently Present Specify Current Suicidal Plan: Patient took an overdose of her medications Access to Means: Yes Specify Access to Suicidal Means: Have medications What has been your use of drugs/alcohol within the last 12 months?: Alcohol & Cannabis How many  times?: 3 Other Self Harm Risks: Reports of none Triggers for Past Attempts: Family contact (Sister got married and didn't tell her) Intentional Self Injurious Behavior: None Risk to Others: Homicidal Ideation: No Thoughts of Harm to Others: No Current Homicidal Intent: No Current Homicidal Plan: No Access to Homicidal Means: No Identified Victim: Reports of none History of harm to others?: No Assessment of Violence: None Noted Violent Behavior Description: Reports of none Does patient have access to weapons?: No Criminal Charges Pending?: No Does patient have a court date: No Prior Inpatient Therapy: Prior Inpatient Therapy: Yes (3 Hospitalizations) Prior Therapy Dates: 2016 & 2013 Prior Therapy Facilty/Provider(s): [facility] Reason for Treatment: Depression Prior Outpatient Therapy: Prior Outpatient Therapy: Yes Prior Therapy Dates: Currently Prior Therapy Facilty/Provider(s): [facility] Reason for Treatment: Depression Does patient have an ACCT team?: No Does patient have Intensive In-House Services?  : No Does patient have [facility] services? : No Does patient have P4CC services?: No  Past Medical History: She describes the onset of diffuse pain around 2004-2005 after gastric bypass surgery, dx with fibromyalgia in 2006. HTN  Current Outpatient Prescriptions   . clonazePAM (KLONOPIN) 1  MG tablet One tablet twice a day and two tablets at bedtime. 120 tablet 3   . oxyCODONE-acetaminophen (PERCOCET) 10-325 mg tablet Take 0.5 tablets by mouth every 6 (six) hours as needed for Pain Earliest Fill Date: 07/13/15 60 tablet 0  . QUEtiapine (SEROQUEL) 300 MG tablet Take 1 tablet (300 mg total) by mouth nightly. 30 tablet 3  . rOPINIRole (REQUIP) 2 MG tablet Take 2 tablets (4 mg total) by mouth nightly. 60 tablet 3  Past Medical History  Diagnosis Date  . Shingles   . Arthritis   . Fibromyalgia   . Depression   . Borderline personality disorder      Past Surgical History  Procedure Laterality Date  . Gastric bypass    . Cholecystectomy    . Shoulder surgery     Family History: History reviewed. No pertinent family history.  Family Psychiatric  History: Both sisters have depression, daughter is seeing a Social worker but will not tell Pt why.   Social History: Social History Summary: Living Situation: Lives in trailer in [place], [place] Relationship status: Divorced x 2 Important relationships: Sisters and son. Daughter is estranged. Education: some college Legal: Denied Trauma History: None. Employment: Disabled, plus [employer] part time.  History  Alcohol Use No     History  Drug Use No    Social History   Social History  . Marital Status: Divorced    Spouse Name: N/A  . Number of Children: N/A  . Years of Education: N/A   Social History Main Topics  . Smoking status: Current Every Day Smoker -- 0.50 packs/day    Types: Cigarettes  . Smokeless tobacco: Never Used  . Alcohol Use: No  . Drug Use: No  . Sexual Activity: Not Asked   Other Topics Concern  . None   Social History Narrative   Additional Social History:    Allergies:   Allergies  Allergen Reactions  . Wellbutrin [Bupropion] Hives and Rash    Labs:  Results for orders placed or performed during the hospital encounter of 08/10/15 (from the past 48 hour(s))  Comprehensive metabolic panel     Status: Abnormal   Collection Time: 08/10/15  1:01 PM  Result Value Ref Range   Sodium 141 135 - 145 mmol/L   Potassium 4.6 3.5 - 5.1 mmol/L    Comment: HEMOLYSIS AT THIS LEVEL MAY AFFECT RESULT   Chloride 113 (H) 101 - 111 mmol/L   CO2 25 22 - 32 mmol/L   Glucose, Bld 101 (H) 65 - 99 mg/dL   BUN 7 6 - 20 mg/dL   Creatinine, Ser 0.74 0.44 - 1.00 mg/dL   Calcium 8.7 (L) 8.9 - 10.3 mg/dL   Total Protein 6.4 (L) 6.5 - 8.1 g/dL   Albumin 3.5 3.5 - 5.0 g/dL   AST 36 15 - 41 U/L    Comment: HEMOLYSIS AT THIS LEVEL MAY AFFECT RESULT   ALT 16 14 - 54 U/L    Alkaline Phosphatase 82 38 - 126 U/L   Total Bilirubin 1.3 (H) 0.3 - 1.2 mg/dL    Comment: HEMOLYSIS AT THIS LEVEL MAY AFFECT RESULT   GFR calc non Af Amer >60 >60 mL/min   GFR calc Af Amer >60 >60 mL/min    Comment: (NOTE) The eGFR has been calculated using the CKD EPI equation. This calculation has not been validated in all clinical situations. eGFR's persistently <60 mL/min signify possible Chronic Kidney Disease.    Anion gap 3 (L) 5 -  15  Salicylate level     Status: None   Collection Time: 08/10/15  1:01 PM  Result Value Ref Range   Salicylate Lvl <9.5 2.8 - 30.0 mg/dL  Acetaminophen level     Status: Abnormal   Collection Time: 08/10/15  1:01 PM  Result Value Ref Range   Acetaminophen (Tylenol), Serum <10 (L) 10 - 30 ug/mL    Comment:        THERAPEUTIC CONCENTRATIONS VARY SIGNIFICANTLY. A RANGE OF 10-30 ug/mL MAY BE AN EFFECTIVE CONCENTRATION FOR MANY PATIENTS. HOWEVER, SOME ARE BEST TREATED AT CONCENTRATIONS OUTSIDE THIS RANGE. ACETAMINOPHEN CONCENTRATIONS >150 ug/mL AT 4 HOURS AFTER INGESTION AND >50 ug/mL AT 12 HOURS AFTER INGESTION ARE OFTEN ASSOCIATED WITH TOXIC REACTIONS.   Ethanol     Status: None   Collection Time: 08/10/15  1:01 PM  Result Value Ref Range   Alcohol, Ethyl (B) <5 <5 mg/dL    Comment:        LOWEST DETECTABLE LIMIT FOR SERUM ALCOHOL IS 5 mg/dL FOR MEDICAL PURPOSES ONLY   CBC with Differential     Status: Abnormal   Collection Time: 08/10/15  1:01 PM  Result Value Ref Range   WBC 5.2 3.6 - 11.0 K/uL   RBC 4.12 3.80 - 5.20 MIL/uL   Hemoglobin 10.0 (L) 12.0 - 16.0 g/dL   HCT 30.5 (L) 35.0 - 47.0 %   MCV 74.1 (L) 80.0 - 100.0 fL   MCH 24.3 (L) 26.0 - 34.0 pg   MCHC 32.8 32.0 - 36.0 g/dL   RDW 16.7 (H) 11.5 - 14.5 %   Platelets 284 150 - 440 K/uL   Neutrophils Relative % 60 %   Neutro Abs 3.1 1.4 - 6.5 K/uL   Lymphocytes Relative 31 %   Lymphs Abs 1.6 1.0 - 3.6 K/uL   Monocytes Relative 7 %   Monocytes Absolute 0.4 0.2 - 0.9 K/uL    Eosinophils Relative 1 %   Eosinophils Absolute 0.1 0 - 0.7 K/uL   Basophils Relative 1 %   Basophils Absolute 0.0 0 - 0.1 K/uL  Urinalysis complete, with microscopic     Status: Abnormal   Collection Time: 08/10/15  1:01 PM  Result Value Ref Range   Color, Urine YELLOW (A) YELLOW   APPearance HAZY (A) CLEAR   Glucose, UA NEGATIVE NEGATIVE mg/dL   Bilirubin Urine NEGATIVE NEGATIVE   Ketones, ur NEGATIVE NEGATIVE mg/dL   Specific Gravity, Urine 1.003 (L) 1.005 - 1.030   Hgb urine dipstick NEGATIVE NEGATIVE   pH 7.0 5.0 - 8.0   Protein, ur NEGATIVE NEGATIVE mg/dL   Nitrite POSITIVE (A) NEGATIVE   Leukocytes, UA 2+ (A) NEGATIVE   RBC / HPF NONE SEEN 0 - 5 RBC/hpf   WBC, UA 6-30 0 - 5 WBC/hpf   Bacteria, UA RARE (A) NONE SEEN   Squamous Epithelial / LPF 0-5 (A) NONE SEEN  Urine Drug Screen, Qualitative     Status: Abnormal   Collection Time: 08/10/15  1:01 PM  Result Value Ref Range   Tricyclic, Ur Screen POSITIVE (A) NONE DETECTED   Amphetamines, Ur Screen NONE DETECTED NONE DETECTED   MDMA (Ecstasy)Ur Screen NONE DETECTED NONE DETECTED   Cocaine Metabolite,Ur [place] NONE DETECTED NONE DETECTED   Opiate, Ur Screen NONE DETECTED NONE DETECTED   Phencyclidine (PCP) Ur S NONE DETECTED NONE DETECTED   Cannabinoid 50 Ng, Ur [place] NONE DETECTED NONE DETECTED   Barbiturates, Ur Screen NONE DETECTED NONE DETECTED   Benzodiazepine, Ur  Scrn POSITIVE (A) NONE DETECTED   Methadone Scn, Ur NONE DETECTED NONE DETECTED    Comment: (NOTE) 412  Tricyclics, urine               Cutoff 1000 ng/mL 200  Amphetamines, urine             Cutoff 1000 ng/mL 300  MDMA (Ecstasy), urine           Cutoff 500 ng/mL 400  Cocaine Metabolite, urine       Cutoff 300 ng/mL 500  Opiate, urine                   Cutoff 300 ng/mL 600  Phencyclidine (PCP), urine      Cutoff 25 ng/mL 700  Cannabinoid, urine              Cutoff 50 ng/mL 800  Barbiturates, urine             Cutoff 200 ng/mL 900  Benzodiazepine, urine            Cutoff 200 ng/mL 1000 Methadone, urine                Cutoff 300 ng/mL 1100 1200 The urine drug screen provides only a preliminary, unconfirmed 1300 analytical test result and should not be used for non-medical 1400 purposes. Clinical consideration and professional judgment should 1500 be applied to any positive drug screen result due to possible 1600 interfering substances. A more specific alternate chemical method 1700 must be used in order to obtain a confirmed analytical result.  1800 Gas chromato graphy / mass spectrometry (GC/MS) is the preferred 1900 confirmatory method.     No current facility-administered medications for this encounter.   Current Outpatient Prescriptions  Medication Sig Dispense Refill  . amoxicillin-clavulanate (AUGMENTIN) 875-125 MG tablet Take 1 tablet by mouth 2 (two) times daily. 20 tablet 0  . clonazePAM (KLONOPIN) 1 MG tablet Take 1 mg by mouth 2 (two) times daily.    . diazepam (VALIUM) 2 MG tablet Take 1 tablet (2 mg total) by mouth every 8 (eight) hours as needed for anxiety. 30 tablet 0  . diphenhydrAMINE (BENADRYL) 25 mg capsule Take 1 capsule (25 mg total) by mouth every 4 (four) hours as needed. 30 capsule 0  . HYDROcodone-acetaminophen (NORCO/VICODIN) 5-325 MG tablet Take 1 tablet by mouth every 6 (six) hours as needed for moderate pain.    . magic mouthwash w/lidocaine SOLN Take 5 mLs by mouth 4 (four) times daily. 240 mL 0  . naproxen (NAPROSYN) 500 MG tablet Take 1 tablet (500 mg total) by mouth 2 (two) times daily with a meal. 60 tablet 2  . oxyCODONE-acetaminophen (ROXICET) 5-325 MG tablet Take 1 tablet by mouth every 6 (six) hours as needed for severe pain. 10 tablet 0  . predniSONE (DELTASONE) 10 MG tablet Take 1 tablet (10 mg total) by mouth daily. 6,5,4,3,2,1 six day taper 21 tablet 0  . QUEtiapine (SEROQUEL) 100 MG tablet Take 100 mg by mouth at bedtime.      Musculoskeletal: Strength & Muscle Tone: within normal limits Gait &  Station: normal Patient leans: N/A  Psychiatric Specialty Exam: Review of Systems  Constitutional: Negative.   HENT: Negative.   Respiratory: Negative.   Gastrointestinal: Negative.   Genitourinary: Negative.   Musculoskeletal: Positive for myalgias.  Skin: Negative.   Neurological: Negative.   Endo/Heme/Allergies: Negative.   Psychiatric/Behavioral: Positive for depression and suicidal ideas.    Blood pressure 142/71, pulse  76, temperature 97.8 F (36.6 C), temperature source Oral, resp. rate 20, height 5' 3"  (1.6 m), weight 99.791 kg (220 lb), SpO2 97 %.Body mass index is 38.98 kg/(m^2).  General Appearance: Well Groomed  Engineer, water::  Good  Speech:  Clear and Coherent  Volume:  Normal  Mood:  Dysphoric  Affect:  Congruent  Thought Process:  Linear  Orientation:  Full (Time, Place, and Person)  Thought Content:  Hallucinations: None  Suicidal Thoughts:  No  Homicidal Thoughts:  No  Memory:  Immediate;   Good Recent;   Good Remote;   Good  Judgement:  Poor  Insight:  Shallow  Psychomotor Activity:  Normal  Concentration:  Good  Recall:  Good  Fund of Knowledge:Good  Language: Good  Akathisia:  No  Handed:    AIMS (if indicated):     Assets:    ADL's:  Intact  Cognition: WNL  Sleep:      Treatment Plan Summary:  Continue IVC Admit to psychiatry Continue same home meds minus clonazepam Will Check lipi panel, HbA1c and prolactin  Disposition: Recommend psychiatric Inpatient admission when medically cleared.  Hildred Priest, MD 08/10/2015 5:28 PM

## 2015-08-10 NOTE — ED Notes (Signed)
Per MD Joni Fears, Poison Control does not need to be called at this time.

## 2015-08-10 NOTE — ED Provider Notes (Signed)
-----------------------------------------   6:30 PM on 08/10/2015 -----------------------------------------   Blood pressure 142/71, pulse 76, temperature 97.8 F (36.6 C), temperature source Oral, resp. rate 20, height 5\' 3"  (1.6 m), weight 99.791 kg, SpO2 97 %.  The patient had no acute events since last update.  Calm and cooperative at this time.  Admitted to behavioral health.      Hinda Kehr, MD 08/11/15 [phone]

## 2015-08-10 NOTE — ED Notes (Signed)
Pt with 3 rings, 1 necklace, 1 bracelet (with 6 charms on it). Placed in cup with label on it, Stephanie at bedside as second witness.

## 2015-08-10 NOTE — BH Assessment (Signed)
While in the assessment patient was served papers by the [facility] about court date with mediation, with her sister.

## 2015-08-10 NOTE — ED Notes (Signed)

## 2015-08-10 NOTE — ED Notes (Signed)
Pt transferred to [facility] at this time, pt ambulatory with no concerns. Pt calm and cooperative. Report given to Amy, RN. Pt made aware and verbalized understanding. RN attempted to remove pt's IV, pt took out own IV. Site clean, intact and not bleeding.
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
## 2015-08-11 ENCOUNTER — Inpatient Hospital Stay
Admission: EM | Admit: 2015-08-11 | Discharge: 2015-08-12 | DRG: 885 | Disposition: A | Discharge status: Home / Self Care | Payer: Medicaid Other | Source: Intra-hospital | Attending: Psychiatry | Admitting: Psychiatry

## 2015-08-11 DIAGNOSIS — M1990 Unspecified osteoarthritis, unspecified site: Secondary | ICD-10-CM | POA: Diagnosis present

## 2015-08-11 DIAGNOSIS — F17210 Nicotine dependence, cigarettes, uncomplicated: Secondary | ICD-10-CM | POA: Diagnosis present

## 2015-08-11 DIAGNOSIS — F17200 Nicotine dependence, unspecified, uncomplicated: Secondary | ICD-10-CM | POA: Diagnosis present

## 2015-08-11 DIAGNOSIS — M797 Fibromyalgia: Secondary | ICD-10-CM | POA: Diagnosis present

## 2015-08-11 DIAGNOSIS — F603 Borderline personality disorder: Secondary | ICD-10-CM | POA: Diagnosis present

## 2015-08-11 DIAGNOSIS — T424X2A Poisoning by benzodiazepines, intentional self-harm, initial encounter: Secondary | ICD-10-CM | POA: Diagnosis present

## 2015-08-11 DIAGNOSIS — Z9889 Other specified postprocedural states: Secondary | ICD-10-CM

## 2015-08-11 DIAGNOSIS — Z9884 Bariatric surgery status: Secondary | ICD-10-CM

## 2015-08-11 DIAGNOSIS — I10 Essential (primary) hypertension: Secondary | ICD-10-CM | POA: Diagnosis present

## 2015-08-11 DIAGNOSIS — F419 Anxiety disorder, unspecified: Secondary | ICD-10-CM | POA: Diagnosis present

## 2015-08-11 DIAGNOSIS — G2581 Restless legs syndrome: Secondary | ICD-10-CM | POA: Diagnosis present

## 2015-08-11 DIAGNOSIS — F339 Major depressive disorder, recurrent, unspecified: Principal | ICD-10-CM | POA: Diagnosis present

## 2015-08-11 DIAGNOSIS — Z9049 Acquired absence of other specified parts of digestive tract: Secondary | ICD-10-CM | POA: Diagnosis not present

## 2015-08-11 DIAGNOSIS — Z888 Allergy status to other drugs, medicaments and biological substances status: Secondary | ICD-10-CM

## 2015-08-11 DIAGNOSIS — Z818 Family history of other mental and behavioral disorders: Secondary | ICD-10-CM | POA: Diagnosis not present

## 2015-08-11 DIAGNOSIS — F331 Major depressive disorder, recurrent, moderate: Secondary | ICD-10-CM

## 2015-08-11 DIAGNOSIS — N390 Urinary tract infection, site not specified: Secondary | ICD-10-CM | POA: Diagnosis present

## 2015-08-11 DIAGNOSIS — G8929 Other chronic pain: Secondary | ICD-10-CM | POA: Diagnosis present

## 2015-08-11 LAB — LIPID PANEL
CHOLESTEROL: 134 mg/dL (ref 0–200)
HDL: 47 mg/dL (ref >40)
LDL CALC: 60 mg/dL (ref 0–99)
TRIGLYCERIDES: 136 mg/dL (ref <150)
Total CHOL/HDL Ratio: 2.9 RATIO
VLDL: 27 mg/dL (ref 0–40)

## 2015-08-11 LAB — HEMOGLOBIN A1C: HEMOGLOBIN A1C: 4.4 % (ref 4.0–6.0)

## 2015-08-11 MED ORDER — MAGNESIUM HYDROXIDE 400 MG/5ML PO SUSP: 30.0000 mL | Freq: Every day | ORAL | Status: DC | PRN

## 2015-08-11 MED ORDER — ALUM & MAG HYDROXIDE-SIMETH 200-200-20 MG/5ML PO SUSP: 30.0000 mL | ORAL | Status: DC | PRN

## 2015-08-11 MED ORDER — QUETIAPINE FUMARATE 100 MG PO TABS
300.0000 mg | ORAL_TABLET | Freq: Every day | ORAL | Status: DC
  Administered 2015-08-11 – 2015-08-12 (×2): 300 mg via ORAL
  Filled 2015-08-11 (×2): qty 1

## 2015-08-11 MED ORDER — DULOXETINE HCL 20 MG PO CPEP
120.0000 mg | ORAL_CAPSULE | Freq: Every day | ORAL | Status: DC
  Administered 2015-08-11 – 2015-08-13 (×3): 120 mg via ORAL
  Filled 2015-08-11 (×3): qty 6

## 2015-08-11 MED ORDER — PNEUMOCOCCAL VAC POLYVALENT 25 MCG/0.5ML IJ INJ
0.5000 mL | INJECTION | INTRAMUSCULAR | Status: AC
  Administered 2015-08-12: 0.5 mL via INTRAMUSCULAR
  Filled 2015-08-11: qty 0.5

## 2015-08-11 MED ORDER — ACETAMINOPHEN 325 MG PO TABS
650.0000 mg | ORAL_TABLET | Freq: Four times a day (QID) | ORAL | Status: DC | PRN
  Filled 2015-08-11: qty 2

## 2015-08-11 MED ORDER — HYDROXYZINE HCL 50 MG PO TABS
50.0000 mg | ORAL_TABLET | Freq: Three times a day (TID) | ORAL | Status: DC | PRN
Start: 2015-08-11 — End: 2015-08-13
  Administered 2015-08-11 – 2015-08-13 (×5): 50 mg via ORAL
  Filled 2015-08-11 (×5): qty 1

## 2015-08-11 MED ORDER — ROPINIROLE HCL 1 MG PO TABS: 4.0000 mg | ORAL_TABLET | Freq: Every day | ORAL | Status: DC

## 2015-08-11 MED ORDER — NICOTINE 21 MG/24HR TD PT24
21.0000 mg | MEDICATED_PATCH | Freq: Every day | TRANSDERMAL | Status: DC
  Administered 2015-08-11: 21 mg via TRANSDERMAL
  Filled 2015-08-11 (×2): qty 1

## 2015-08-11 MED ORDER — ROPINIROLE HCL 1 MG PO TABS
4.0000 mg | ORAL_TABLET | Freq: Every day | ORAL | Status: DC
  Administered 2015-08-11 – 2015-08-12 (×2): 4 mg via ORAL
  Filled 2015-08-11 (×2): qty 4

## 2015-08-11 MED ORDER — OXYCODONE-ACETAMINOPHEN 5-325 MG PO TABS
1.0000 | ORAL_TABLET | Freq: Four times a day (QID) | ORAL | Status: DC | PRN
  Administered 2015-08-11 – 2015-08-13 (×4): 1 via ORAL
  Filled 2015-08-11 (×4): qty 1

## 2015-08-11 NOTE — ED Notes (Signed)
Pt is awake and oriented this evening. Pt mood is irritable and her affect is sad/flat. Pt denies SI/HI and AVH at this time. She is requesting her Requip due to restless leg syndrome, which the MD approved. Pt showered and went to sleep immediately afterwards. Staff is monitoring respirations and pt is in no distress. 15 minute checks are ongoing for safety.

## 2015-08-11 NOTE — H&P (Signed)
Psychiatric Admission Assessment Adult  Patient Identification: Ann Bowen MRN:  [id number] Date of Evaluation:  08/11/2015 Chief Complaint:  Depression Principal Diagnosis: MDD (major depressive disorder), recurrent episode ([place]) Diagnosis:   Patient Active Problem List   Diagnosis Date Noted  . Borderline personality disorder [F60.3] 08/10/2015  . MDD (major depressive disorder), recurrent episode ([place]) [F33.9] 08/10/2015  . Fibromyalgia [M79.7] 08/10/2015  . Osteoarthritis [M19.90] 08/10/2015  . Tobacco use disorder [F17.200] 08/10/2015   Subjective:  Ann Bowen is a 60 y.o. female patient admitted with s/p OD.  HPI:   The patient is a 60 y.o. WF with MDD, borderline personality d/o, h/o fibromyalgia and MDD recurrent brought to the ED due to an overdose. She states that she's been depressed recently and feeling suicidal because she is about to be evicted from her sister home. This morning she was feeling so hopeless that she took 40 of her 1 mg Klonopin tablets in a suicide attempt at 10:00 AM today. She also took 2 Percocets.   No evidence of drowsiness, confusion, or sedation. Patient is completely alert and awake. Walking around without difficulty which is not consistent with an overdose on 40 clonazepam. Urine toxicology is positive for benzodiazepines, TCAs (patient is on Seroquel), negative for opiates, alcohol level is below detection limit.  Patient said for the last year she has been living with her sister. They have not been getting along and her sister have notify her a while back t that she needed to move out. Yesterday was the last date she had to move out of the house. The patient however states that she wasn't ready to move out as she has been sick. She also felt that yesterday was not a good day because it was raining. She also thought that the sister had changed her mind because she had been acting real nice to her.  Patient has received a  63 b (from her sister) order which was served while she was in the emergency department.   Patient says she wants to be discharged by to her sister's house. She tells me that the [place] her 1 more week there; therefore she could return to her sister house and get ready to move to an apartment. Says she has paid the deposit already. She says that after taking the 40 tablets of clonazepam she was not even sedated or tired. She does not want to be hospitalized. She does not feel the medications need to be adjusted. She says if she is hospitalized she might lose the chance of moving into the Apartment.  She says her sister is doing all of this "bad things" to her because of the man.  Patient explains that her sister hired a man to work in the property and this men favored the patient and not the patient's sister which created jealousy and conflict between them.  The patient says she is in love with this man. She tried to explain to me why taking the overdose was an attempt to help this man as he had some legal charges and needed a stable address. Despite her explanation I was unable to understand what she meant. She says that these men has been in prisoned in the past and therefore he needed to have a stable address, I assume he is on  probation or parole. Patient believes that somehow by her taking the overdose was helping this man "because I'm trouble".  At this time patient is denying suicidal history, homicidality  or having auditory or visual hallucinations.  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 Ann Bowen took this job loss very personally and has not been able to work in a sustained fashion since that time. Prior to the onset of fibromyalgia she tried cymbalta for depression though was unsuccessful and amitriptyline for which she cannot recall its effects;  Anger  management issues: has broken 4-5 cell phones in anger, thrown antiques and plates, and been extremely hostile to people including her own daughter Ann Bowen such that she is now estranged from her. As noted she has had short fuse and outbursts (e.g. said "Fuck you Ann Bowen" to her pastor who tried to joke with her about her missing tooth) which seem to support a non-mania explanation for her temper issues over the years. In 2016 she was in the hospital twice; she describes the first time she was cutting her wrist in context of SI related to [place] not inviting her to wedding, hospitalized for 3 days. The second time was the day after Ann Bowen's wedding, she saw pictures of the wedding and was upset, exacerbated by not being thanked for making her a quilt, SI by not SA, self-referred.   Substance Abuse: Tobacco: Denied Alcohol: Denied Illicit Drugs: Denied  Associated Signs/Symptoms: Depression Symptoms:  depressed mood, suicidal attempt, (Hypo) Manic Symptoms:  Impulsivity, Anxiety Symptoms:  denies Psychotic Symptoms:  denies PTSD Symptoms: NA   Total Time spent with patient: 1 hour  Past Psychiatric History: Patient carries a diagnosis of questionable bipolar disorder, borderline personality disorder and chronic pain. [facility] with Dr.Marks. Patient is also seeing a therapist here in [place].  Patient has been hospitalized a multitude of times in the past for multiple overdoses. Per records she has overdosed on Ambien, Xanax and Vicodin. Patient had a trial of unilateral ECT about a year and a half ago during a hospitalization in [place].  Inpatient: 4-5 x for SI/SA, first time 2005 Outpatient: Providers in [place]. Dr. Wylene Simmer at [facility] in the past. Seeing now a psychiatrist at [facility] Previously treated with Says she has tried multiple drugs, including several SSRI:s, Wellbutrin (caused a rash) and Venlafaxine, none of which have  worked. Past Suicidal Ideation: OD on Ambien, Xanax, and Vicodin in 2010, requiring hospitalization.  Medication History: lyrica -- ineffective and sedating. Hydrocodone -- effective. abilify -- "made me crazy". No lithium. Lamictal -- reports no benefit. seroquel -- 50 mg was too strong, too sedating. fetzima -- no benefit. effexor XR -- remotely, can't recall. latuda -- stopped due to nausea. remeron -- weight gain when increased from 15-30; remeron and tegretol did not benefit her psychiatrically (was hospitalized twice).   Procedural History: ECT unilateral -- beneficial  Is the patient at risk to self? Yes.    Has the patient been a risk to self in the past 6 months? No.  Has the patient been a risk to self within the distant past? Yes.    Is the patient a risk to others? No.  Has the patient been a risk to others in the past 6 months? No.  Has the patient been a risk to others within the distant past? No.    Past Medical History: She describes the onset of diffuse pain around 2004-2005 after gastric bypass surgery, dx with fibromyalgia in 2006. HTN  Current  Outpatient Prescriptions   . clonazePAM (KLONOPIN) 1 MG tablet One tablet twice a day and two tablets at bedtime. 120 tablet 3  . oxyCODONE-acetaminophen (PERCOCET) 10-325 mg tablet Take 0.5 tablets by mouth every 6 (six) hours as needed for Pain Earliest Fill Date: 07/13/15 60 tablet 0  . QUEtiapine (SEROQUEL) 300 MG tablet Take 1 tablet (300 mg total) by mouth nightly. 30 tablet 3  . rOPINIRole (REQUIP) 2 MG tablet Take 2 tablets (4 mg total) by mouth nightly. 60 tablet 3  Past Medical History  Diagnosis Date  . Shingles   . Arthritis   . Fibromyalgia   . Depression   . Borderline personality disorder     Past Surgical History  Procedure Laterality Date  . Gastric bypass    . Cholecystectomy    . Shoulder surgery     Family History:Both sisters have depression, daughter is seeing a Social worker.  Social History:  Living Situation: Ann Bowen with her sister in her sister's property. Sister has asked the patient to leave. Relationship status: Divorced x 2 Important relationships: Sisters and son. Daughter is estranged. Education: some college Legal: Denied Trauma History: None. Employment: Disabled History  Alcohol Use  . Yes    Comment: occasional     History  Drug Use No     Allergies:   Allergies  Allergen Reactions  . Wellbutrin [Bupropion] Hives and Rash   Lab Results:  Results for orders placed or performed during the hospital encounter of 08/11/15 (from the past 48 hour(s))  Lipid panel     Status: None   Collection Time: 08/11/15  6:55 AM  Result Value Ref Range   Cholesterol 134 0 - 200 mg/dL   Triglycerides 136 <150 mg/dL   HDL 47 >40 mg/dL   Total CHOL/HDL Ratio 2.9 RATIO   VLDL 27 0 - 40 mg/dL   LDL Cholesterol 60 0 - 99 mg/dL    Comment:        Total Cholesterol/HDL:CHD Risk Coronary Heart Disease Risk Table                     Men   Women  1/2 Average Risk   3.4   3.3  Average Risk       5.0   4.4  2 X Average Risk   9.6   7.1  3 X Average Risk  23.4   11.0        Use the calculated Patient Ratio above and the CHD Risk Table to determine the patient's CHD Risk.        ATP III CLASSIFICATION (LDL):  <100     mg/dL   Optimal  100-129  mg/dL   Near or Above                    Optimal  130-159  mg/dL   Borderline  160-189  mg/dL   High  >190     mg/dL   Very High     Blood Alcohol level:  Lab Results  Component Value Date   ETH <5 [id number]    Metabolic Disorder Labs:  No results found for: HGBA1C, MPG No results found for: PROLACTIN Lab Results  Component Value Date   CHOL 134 08/11/2015   TRIG 136 08/11/2015   HDL 47 08/11/2015   CHOLHDL 2.9 08/11/2015   VLDL 27 08/11/2015   LDLCALC 60 08/11/2015    Current Medications: Current Facility-Administered Medications  Medication Dose Route Frequency Provider  Last Rate Last Dose  . acetaminophen  (TYLENOL) tablet 650 mg  650 mg Oral Q6H PRN Hildred Priest, MD      . alum & mag hydroxide-simeth (MAALOX/MYLANTA) 200-200-20 MG/5ML suspension 30 mL  30 mL Oral Q4H PRN Hildred Priest, MD      . DULoxetine (CYMBALTA) DR capsule 120 mg  120 mg Oral Daily Hildred Priest, MD   120 mg at 08/11/15 1146  . hydrOXYzine (ATARAX/VISTARIL) tablet 50 mg  50 mg Oral TID PRN Hildred Priest, MD   50 mg at 08/11/15 1146  . magnesium hydroxide (MILK OF MAGNESIA) suspension 30 mL  30 mL Oral Daily PRN Hildred Priest, MD      . oxyCODONE-acetaminophen (PERCOCET/ROXICET) 5-325 MG per tablet 1 tablet  1 tablet Oral Q6H PRN Hildred Priest, MD      . Derrill Memo ON 08/12/2015] pneumococcal 23 valent vaccine (PNU-IMMUNE) injection 0.5 mL  0.5 mL Intramuscular Tomorrow-1000 Jolanta B Pucilowska, MD      . QUEtiapine (SEROQUEL) tablet 300 mg  300 mg Oral QHS Hildred Priest, MD      . rOPINIRole (REQUIP) tablet 4 mg  4 mg Oral QPC supper Hildred Priest, MD       PTA Medications: Prescriptions prior to admission  Medication Sig Dispense Refill Last Dose  . clonazePAM (KLONOPIN) 1 MG tablet      . DULoxetine (CYMBALTA) 60 MG capsule Take by mouth.     . QUEtiapine (SEROQUEL) 300 MG tablet Take by mouth.     Marland Kitchen rOPINIRole (REQUIP) 2 MG tablet Take by mouth.     . naproxen (NAPROSYN) 500 MG tablet Take 1 tablet (500 mg total) by mouth 2 (two) times daily with a meal. 60 tablet 2   . [START ON 09/20/2015] oxyCODONE-acetaminophen (PERCOCET) 10-325 MG tablet      . predniSONE (DELTASONE) 10 MG tablet Take 1 tablet (10 mg total) by mouth daily. 6,5,4,3,2,1 six day taper 21 tablet 0   . QUEtiapine (SEROQUEL) 100 MG tablet Take 100 mg by mouth at bedtime.       Musculoskeletal: Strength & Muscle Tone: within normal limits Gait & Station: normal Patient leans: N/A  Psychiatric Specialty Exam: Physical Exam  Constitutional: She is oriented to  person, place, and time. She appears well-developed and well-nourished.  HENT:  Head: Normocephalic and atraumatic.  Eyes: Conjunctivae and EOM are normal.  Neck: Normal range of motion.  Respiratory: Effort normal.  Musculoskeletal: Normal range of motion.  Neurological: She is alert and oriented to person, place, and time.    Review of Systems  Constitutional: Negative.   HENT: Negative.   Eyes: Negative.   Respiratory: Negative.   Cardiovascular: Negative.   Gastrointestinal: Negative.   Genitourinary: Negative.   Musculoskeletal: Negative.   Skin: Negative.   Neurological: Negative.   Endo/Heme/Allergies: Negative.   Psychiatric/Behavioral: Negative.     Blood pressure 144/78, pulse 69, temperature 98.5 F (36.9 C), temperature source Oral, resp. rate 18, height 5\' 3"  (1.6 m), weight 92.987 kg (205 lb), SpO2 100 %.Body mass index is 36.32 kg/(m^2).  General Appearance: Well Groomed  Engineer, water::  Good  Speech:  Clear and Coherent  Volume:  Normal  Mood:  Dysphoric  Affect:  Appropriate  Thought Process:  Linear  Orientation:  Full (Time, Place, and Person)  Thought Content:  Hallucinations: None  Suicidal Thoughts:  No  Homicidal Thoughts:  No  Memory:  Immediate;   Good Recent;   Good Remote;   Good  Judgement:  Poor  Insight:  Shallow  Psychomotor Activity:  Normal  Concentration:  Good  Recall:  Good  Fund of Knowledge:Good  Language: Good  Akathisia:  No  Handed:    AIMS (if indicated):     Assets:  Agricultural consultant Physical Health  ADL's:  Intact  Cognition: WNL  Sleep:      Treatment Plan Summary:  MDD: continue cymbalta 120 mg po q day and seroquel 300 mg po qhs  Borderline personality: continue psychotherapy, will benefit from DBT.  Anxiety: will start vistaril 50 mg tid prn.  Will no restart clonazepam as she OD on it.  RLS: continue requip 4 mg po qhs  Chronic pain-fibromyalgia: continue oxycodone 5 mg  q 6 h prn  Tobacco use d/o: will order nicotine patch 21 mg /day  Diet regular  Precautions every 15 minute checks  Hospitalization and status will be changed to voluntary  Discharge disposition patient will be discharged once stable  Discharge follow-up she'll continue to follow with her therapist and with [facility] upon discharge  Labs I'll order  hemoglobin A1c, TSH and prolactin---- all of these results are pending.  I certify that inpatient services furnished can reasonably be expected to improve the patient's condition.    Hildred Priest, MD 3/28/20173:57 PM

## 2015-08-11 NOTE — Progress Notes (Signed)
Patient admitted IVC for overdose on 40 1mg  Klonopin tablets and 1 Vicodin.  Patient pleasant and cooperative on admission.  Patient reports that she currently lives with her sister, however her sister has kicked her out because she is a "bitch."  Patient reports that she has no family support and will be homeless.  She currently works part time at [employer].  Patient search performed.  No contraband found.

## 2015-08-11 NOTE — Plan of Care (Signed)
Problem: Consults Goal: [facility] General Treatment Patient Education Outcome: Progressing Cooperative with plan of care, encouraged to attend therapy groups.

## 2015-08-11 NOTE — Tx Team (Signed)
Initial Interdisciplinary Treatment Plan   PATIENT STRESSORS: Financial difficulties Marital or family conflict   PATIENT STRENGTHS: Ability for insight Active sense of humor Communication skills General fund of knowledge   PROBLEM LIST: Problem List/Patient Goals Date to be addressed Date deferred Reason deferred Estimated date of resolution  Depression 08/11/15     Suicidal Ideation 08/11/15                                                DISCHARGE CRITERIA:  Improved stabilization in mood, thinking, and/or behavior  PRELIMINARY DISCHARGE PLAN: Outpatient therapy  PATIENT/FAMIILY INVOLVEMENT: This treatment plan has been presented to and reviewed with the patient, Ann Bowen, and/or family member.  The patient and family have been given the opportunity to ask questions and make suggestions.  Nash Mantis [facility] 08/11/2015, 3:50 AM

## 2015-08-11 NOTE — BHH Group Notes (Signed)
[place] LCSW Group Therapy   08/11/2015 11:00 am  Type of Therapy: Group Therapy   Participation Level: Active   Participation Quality: Attentive, Sharing and Supportive   Affect: Appropriate  Cognitive: Alert and Oriented   Insight: Developing/Improving and Engaged   Engagement in Therapy: Developing/Improving and Engaged   Modes of Intervention: Clarification, Confrontation, Discussion, Education, Exploration,  Limit-setting, Orientation, Problem-solving, Rapport Building, Art therapist, Socialization and Support  Summary of Progress/Problems: The topic for group therapy was feelings about diagnosis. Pt actively participated in group discussion on their past and current diagnosis and how they feel towards this. Pt also identified how society and family members judge them, based on their diagnosis as well as stereotypes and stigmas. Pt shared her diagnosis of bi-polar which led to her being in denial and ultimately rejecting effective treatment that.  Pt shared she has felt judged by others in the past, as well as remorseful at herself for not "handling" her condition but that effective education on her diagnoses by professionals has helped her and her with time and pain that resulted from being in denial she had come to accept the pt's diagnoses as treatable. Pt shared she hopes her diagnosis will help her once she is discharged to allow treatment that will help her to be more normal. CSW actively validated the pt's opinion and provided feedback.  Pt was polite and cooperative with the CSW and other group members and focused and attentive to the topics discussed and the sharing of others.    Alphonse Guild. Kacyn Souder, LCSWA, LCAS

## 2015-08-11 NOTE — Tx Team (Signed)
Interdisciplinary Treatment Plan Update (Adult)  Date:  08/11/2015 Time Reviewed:  12:03 PM  Progress in Treatment: Attending groups: Yes. Participating in groups:  Yes. Taking medication as prescribed:  Yes. Tolerating medication:  Yes. Family/Significant othe contact made:  No, will contact:  if patient provides consent Patient understands diagnosis:  Yes. Discussing patient identified problems/goals with staff:  Yes. Medical problems stabilized or resolved:  Yes. Denies suicidal/homicidal ideation: Yes. Issues/concerns per patient self-inventory:  Yes. Other:  New problem(s) identified: No, Describe:  none reported  Discharge Plan or Barriers: Patient will stabilize on medication and discharge home to her new apartment. Patient will follow up with outpatient services in [facility].  Reason for Continuation of Hospitalization: Depression Medication stabilization Suicidal ideation  Comments:  Estimated length of stay: up to 2 days, expected discharge Thursday 08/13/15  New goal(s):  Review of initial/current patient goals per problem list:   1.  Goal(s): Participate in aftercare plan   Met:  No  Target date: by discharge  As evidenced by: patient will participate in aftercare plan AEB aftercare provider and housing plan identified at discharge 08/11/15: Patient has an apartment she can discharge to but does not have outpatient provider at this time. CSW will assist patient in identifying a provider.   2.  Goal (s): Decrease depression   Met:  No  Target date: by discharge  As evidenced by: patient demonstrates decreased symptoms of depression and reports a Depression rating of 3 or less 08/11/15: patient reports no SI and will continue to monitor for depressive symptoms.     Attendees: Physician:  Merlyn Albert, MD 3/28/201712:03 PM  Nursing:   Carolynn Sayers, RN 3/28/201712:03 PM  Other:  Carmell Austria, [place] 3/28/201712:03 PM  Other:   3/28/201712:03 PM   Other:   3/28/201712:03 PM  Other:  3/28/201712:03 PM  Other:  3/28/201712:03 PM  Other:  3/28/201712:03 PM  Other:  3/28/201712:03 PM  Other:  3/28/201712:03 PM  Other:  3/28/201712:03 PM  Other:   3/28/201712:03 PM   Scribe for Treatment Team:   Keene Breath, MSW, [place]  08/11/2015, 12:03 PM

## 2015-08-11 NOTE — Plan of Care (Signed)
Problem: Ineffective individual coping Goal: STG: Patient will remain free from self harm Outcome: Progressing Patient has remained free from harm since admission.

## 2015-08-11 NOTE — BHH Group Notes (Signed)
[place] Group Notes:  (Nursing/MHT/Case Management/Adjunct)  Date:  08/11/2015  Time:  1:55 PM  Type of Therapy:  Psychoeducational Skills  Participation Level:  Did Not Attend  Charise Killian 08/11/2015, 1:55 PM

## 2015-08-11 NOTE — Progress Notes (Signed)
D: Patient appears sad and depressed. Denies SI/HI/AVH at this time. States she has chronic pain. States she lost it after her sister kicked her out. She was somewhat tearful when talking about experience. She attended wrap up group.  A: Medication was given with education. Encouragement was provided.  R: Patient was compliant with medication. She has remained calm and cooperative. Safety maintained with 15 min checks.

## 2015-08-11 NOTE — Progress Notes (Signed)
Patient new admit, appropriate affect, cooperative behavior with meals, meds and plan of care. No SI/HI at this time. Encouraged to attend therapy groups to learn and initiate coping skills for management of stressors and diagnosis. Safety maintained.

## 2015-08-11 NOTE — Progress Notes (Signed)
Recreation Therapy Notes  At approximately 1:20 pm, LRT attempted assessment. Patient sleeping.  Leonette Monarch, LRT/CTRS 08/11/2015 1:38 PM

## 2015-08-11 NOTE — Progress Notes (Signed)
Recreation Therapy Notes  Date: 03.28.17 Time: 3:00 pm Location: Craft Room  Group Topic: Goal Setting  Goal Area(s) Addresses:  Patient will write at least one goal. Patient will write at least one obstacle.  Behavioral Response: Did not attend  Intervention: Recovery Goal Chart  Activity: Patients were instructed to make a Recovery Goal Chart including goals, obstacles, the date they started working on their goals, and the date they achieved their goals.  Education: LRT educated patients on healthy ways to celebrate reaching their goals.  Education Outcome: Patient did not attend group.  Clinical Observations/Feedback: Patient did not attend group.  Leonette Monarch, LRT/CTRS 08/11/2015 4:23 PM

## 2015-08-12 LAB — URINE CULTURE: Culture: >=100000

## 2015-08-12 LAB — PROLACTIN: Prolactin: 2.3 ng/mL — ABNORMAL LOW (ref 4.8–23.3)

## 2015-08-12 MED ORDER — DULOXETINE HCL 60 MG PO CPEP: 120.0000 mg | ORAL_CAPSULE | Freq: Every day | ORAL | Status: DC

## 2015-08-12 MED ORDER — ROPINIROLE HCL 4 MG PO TABS: 4.0000 mg | ORAL_TABLET | Freq: Every day | ORAL | Status: DC

## 2015-08-12 NOTE — BHH Group Notes (Signed)
[place] Group Notes:  (Nursing/MHT/Case Management/Adjunct)  Date:  08/12/2015  Time:  11:34 PM  Type of Therapy:  Evening Wrap-up Group  Participation Level:  Minimal  Participation Quality:  Appropriate  Affect:  Appropriate  Cognitive:  Appropriate  Insight:  Appropriate  Engagement in Group:  Engaged  Modes of Intervention:  Discussion  Summary of Progress/Problems:  Ann Bowen 08/12/2015, 11:34 PM

## 2015-08-12 NOTE — Plan of Care (Signed)
Problem: Ineffective individual coping Goal: LTG: Patient will report a decrease in negative feelings Outcome: Progressing Rates depression at 8 out of 10, feeling anxious about discharging tomorrow.

## 2015-08-12 NOTE — Progress Notes (Signed)
Recreation Therapy Notes  Date: 03.29.17 Time: 3:00 pm Location: Craft Room  Group Topic: Self-esteem  Goal Area(s) Addresses:  Patient will be able to identify benefit of self-esteem. Patient will be able to identify ways to increase self-esteem.  Behavioral Response: Did not attend  Intervention: Self-Portrait  Activity: Patients were instructed to draw their self-portrait of how they felt. Afterwards, patients were instructed to write their name and positive trait about themselves. They passed their papers around and wrote positive traits about their peers. Patient read what their peers wrote and drew their self-portrait of how they felt after reading the positive traits.  Education: LRT educated patients on ways they can increase their self-esteem.  Education Outcome: Patient did not attend group.  Clinical Observations/Feedback: Patient did not attend group.  Leonette Monarch, LRT/CTRS 08/12/2015 3:45 PM

## 2015-08-12 NOTE — BHH Group Notes (Signed)
[place] Group Notes:  (Nursing/MHT/Case Management/Adjunct)  Date:  08/12/2015  Time:  1:57 PM  Type of Therapy:  Group Therapy  Participation Level:  Active  Participation Quality:  Appropriate  Affect:  Appropriate  Cognitive:  Appropriate  Insight:  Good  Engagement in Group:  Engaged  Modes of Intervention:  Support  Summary of Progress/Problems:  Ann Bowen 08/12/2015, 1:57 PM

## 2015-08-12 NOTE — BHH Group Notes (Signed)
[facility] LCSW Aftercare Discharge Planning Group Note   08/12/2015 9:15 AM  ?  Participation Quality: Alert, Appropriate and Oriented   Mood/Affect: Depressed and Flat   Depression Rating: 9  Anxiety Rating: 5  Thoughts of Suicide: Pt denies SI/HI   Will you contract for safety? Yes   Current AVH: Pt denies   Plan for Discharge/Comments: Pt attended discharge planning group and actively participated in group. CSW provided pt with today's workbook. Pt shared she plans to return to [place] to live.  When questioned about her discharge plans the pt replied that she was still having a difficult time processing why she was admitted.  Pt reported she is feeling sleep from her medications and doesn't feel much like discussing discharge planning.   CSW actively validated the pt's opinion and provided feedback.  Pt was polite and cooperative with the CSW and other group members and focused and attentive to the topics discussed and the sharing of others.   Transportation Means: Pt reports access to transportation   Supports: Pt lists family and friends as primary supports     Alphonse Guild. Johna Kearl, MSW, LCSWA, LCAS

## 2015-08-12 NOTE — Progress Notes (Signed)
[facility] MD Progress Note  08/12/2015 9:20 AM Ann Bowen  MRN:  [id number] Subjective:  Patient tells me she feels well. She denies having major problems with mood, appetite, sleep, energy or concentration. She denies suicidality, homicidality or having auditory or visual hallucinations. She denies side effects from medications. She denies physical complaints. The patient is excited about plans for discharge tomorrow. She plans to move into an apartment.    Per nursing: D: Patient appears sad and depressed. Denies SI/HI/AVH at this time. States she has chronic pain. States she lost it after her sister kicked her out. She was somewhat tearful when talking about experience. She attended wrap up group.   Principal Problem: MDD (major depressive disorder), recurrent episode ([place]) Diagnosis:   Patient Active Problem List   Diagnosis Date Noted  . Borderline personality disorder [F60.3] 08/10/2015  . MDD (major depressive disorder), recurrent episode ([place]) [F33.9] 08/10/2015  . Fibromyalgia [M79.7] 08/10/2015  . Osteoarthritis [M19.90] 08/10/2015  . Tobacco use disorder [F17.200] 08/10/2015   Total Time spent with patient: 30 minutes  Past Psychiatric History:  Patient carries a diagnosis of questionable bipolar disorder, borderline personality disorder and chronic pain. [facility] Dr.[facility]. Patient is also seeing a therapist here in [place].  Patient has been hospitalized a multitude of times in the past for multiple overdoses. Per records she has overdosed on Ambien, Xanax and Vicodin. Patient had a trial of unilateral ECT about a year and a half ago during a hospitalization in [place].  Inpatient: 4-5 x for SI/SA, first time 2005 Outpatient: Providers in [place]. Dr. Wylene Simmer at [facility] in the past. Seeing now a psychiatrist at [facility] Previously treated with Says she has tried multiple drugs, including several SSRI:s,  Wellbutrin (caused a rash) and Venlafaxine, none of which have worked. Past Suicidal Ideation: OD on Ambien, Xanax, and Vicodin in 2010, requiring hospitalization.  Medication History: lyrica -- ineffective and sedating. Hydrocodone -- effective. abilify -- "made me crazy". No lithium. Lamictal -- reports no benefit. seroquel -- 50 mg was too strong, too sedating. fetzima -- no benefit. effexor XR -- remotely, can't recall. latuda -- stopped due to nausea. remeron -- weight gain when increased from 15-30; remeron and tegretol did not benefit her psychiatrically (was hospitalized twice).   Procedural History: ECT unilateral -- beneficial   Past Medical History: She describes the onset of diffuse pain around 2004-2005 after gastric bypass surgery, dx with fibromyalgia in 2006. HTN  Current Outpatient Prescriptions   . clonazePAM (KLONOPIN) 1 MG tablet One tablet twice a day and two tablets at bedtime. 120 tablet 3  . oxyCODONE-acetaminophen (PERCOCET) 10-325 mg tablet Take 0.5 tablets by mouth every 6 (six) hours as needed for Pain Earliest Fill Date: 07/13/15 60 tablet 0  . QUEtiapine (SEROQUEL) 300 MG tablet Take 1 tablet (300 mg total) by mouth nightly. 30 tablet 3  . rOPINIRole (REQUIP) 2 MG tablet Take 2 tablets (4 mg total) by mouth nightly. 60 tablet 3  Past Medical History  Diagnosis Date  . Shingles   . Arthritis   . Fibromyalgia   . Depression   . Borderline personality disorder     Past Surgical History  Procedure Laterality Date  . Gastric bypass    . Cholecystectomy    . Shoulder surgery     Family History:Both sisters have depression, daughter is seeing a Social worker.  Social History: Living Situation: [place] with her sister in her sister's property.  Sister has asked the patient to leave. Relationship status: Divorced x 2 Important relationships: Sisters and son. Daughter is estranged. Education: some college Legal: Denied Trauma History: None. Employment:  Disabled History  Alcohol Use  . Yes    Comment: occasional     History  Drug Use No    Social History   Social History  . Marital Status: Divorced    Spouse Name: N/A  . Number of Children: N/A  . Years of Education: N/A   Social History Main Topics  . Smoking status: Current Every Day Smoker -- 0.50 packs/day    Types: Cigarettes  . Smokeless tobacco: Never Used  . Alcohol Use: Yes     Comment: occasional  . Drug Use: No  . Sexual Activity: Not Asked   Other Topics Concern  . None   Social History Narrative     Current Medications: Current Facility-Administered Medications  Medication Dose Route Frequency Provider Last Rate Last Dose  . acetaminophen (TYLENOL) tablet 650 mg  650 mg Oral Q6H PRN Hildred Priest, MD      . alum & mag hydroxide-simeth (MAALOX/MYLANTA) 200-200-20 MG/5ML suspension 30 mL  30 mL Oral Q4H PRN Hildred Priest, MD      . DULoxetine (CYMBALTA) DR capsule 120 mg  120 mg Oral Daily Hildred Priest, MD   120 mg at 08/12/15 0815  . hydrOXYzine (ATARAX/VISTARIL) tablet 50 mg  50 mg Oral TID PRN Hildred Priest, MD   50 mg at 08/12/15 0814  . magnesium hydroxide (MILK OF MAGNESIA) suspension 30 mL  30 mL Oral Daily PRN Hildred Priest, MD      . nicotine (NICODERM CQ - dosed in mg/24 hours) patch 21 mg  21 mg Transdermal Daily Hildred Priest, MD   21 mg at 08/11/15 1736  . oxyCODONE-acetaminophen (PERCOCET/ROXICET) 5-325 MG per tablet 1 tablet  1 tablet Oral Q6H PRN Hildred Priest, MD   1 tablet at 08/12/15 [phone]  . QUEtiapine (SEROQUEL) tablet 300 mg  300 mg Oral QHS Hildred Priest, MD   300 mg at 08/11/15 2114  . rOPINIRole (REQUIP) tablet 4 mg  4 mg Oral QPC supper Hildred Priest, MD   4 mg at 08/11/15 1735    Lab Results:  Results for orders placed or performed during the hospital encounter of 08/11/15 (from the past 48 hour(s))  Hemoglobin A1c      Status: None   Collection Time: 08/11/15  6:55 AM  Result Value Ref Range   Hgb A1c MFr Bld 4.4 4.0 - 6.0 %  Lipid panel     Status: None   Collection Time: 08/11/15  6:55 AM  Result Value Ref Range   Cholesterol 134 0 - 200 mg/dL   Triglycerides 136 <150 mg/dL   HDL 47 >40 mg/dL   Total CHOL/HDL Ratio 2.9 RATIO   VLDL 27 0 - 40 mg/dL   LDL Cholesterol 60 0 - 99 mg/dL    Comment:        Total Cholesterol/HDL:CHD Risk Coronary Heart Disease Risk Table                     Men   Women  1/2 Average Risk   3.4   3.3  Average Risk       5.0   4.4  2 X Average Risk   9.6   7.1  3 X Average Risk  23.4   11.0        Use the calculated Patient  Ratio above and the CHD Risk Table to determine the patient's CHD Risk.        ATP III CLASSIFICATION (LDL):  <100     mg/dL   Optimal  100-129  mg/dL   Near or Above                    Optimal  130-159  mg/dL   Borderline  160-189  mg/dL   High  >190     mg/dL   Very High   Prolactin     Status: Abnormal   Collection Time: 08/11/15  6:55 AM  Result Value Ref Range   Prolactin 2.3 (L) 4.8 - 23.3 ng/mL    Comment: (NOTE) Performed At: [facility] [place], [place] [id number] Lindon Romp MD [id number]     Blood Alcohol level:  Lab Results  Component Value Date   [facility] <5 08/10/2015    Physical Findings: AIMS: Facial and Oral Movements Muscles of Facial Expression: None, normal Lips and Perioral Area: None, normal Jaw: None, normal Tongue: None, normal,Extremity Movements Upper (arms, wrists, hands, fingers): None, normal Lower (legs, knees, ankles, toes): None, normal, Trunk Movements Neck, shoulders, hips: None, normal, Overall Severity Severity of abnormal movements (highest score from questions above): None, normal Incapacitation due to abnormal movements: None, normal Patient's awareness of abnormal movements (rate only patient's report): No Awareness, Dental Status Current problems with teeth  and/or dentures?: No Does patient usually wear dentures?: No  CIWA:    COWS:     Musculoskeletal: Strength & Muscle Tone: within normal limits Gait & Station: normal Patient leans: N/A  Psychiatric Specialty Exam: Review of Systems  Constitutional: Negative.   HENT: Negative.   Eyes: Negative.   Respiratory: Negative.   Cardiovascular: Negative.   Gastrointestinal: Negative.   Genitourinary: Negative.   Musculoskeletal: Negative.   Skin: Negative.   Neurological: Negative.   Endo/Heme/Allergies: Negative.   Psychiatric/Behavioral: Negative.     Blood pressure 140/77, pulse 74, temperature 97.8 F (36.6 C), temperature source Oral, resp. rate 18, height 5\' 3"  (1.6 m), weight 92.987 kg (205 lb), SpO2 100 %.Body mass index is 36.32 kg/(m^2).  General Appearance: Well Groomed  Engineer, water::  Good  Speech:  Clear and Coherent  Volume:  Normal  Mood:  Euthymic  Affect:  Appropriate  Thought Process:  Linear  Orientation:  Full (Time, Place, and Person)  Thought Content:  Hallucinations: None  Suicidal Thoughts:  No  Homicidal Thoughts:  No  Memory:  Immediate;   Good Recent;   Good Remote;   Good  Judgement:  Poor  Insight:  Shallow  Psychomotor Activity:  Normal  Concentration:  Good  Recall:  Good  Fund of Knowledge:Good  Language: Good  Akathisia:  No  Handed:    AIMS (if indicated):     Assets:  Agricultural consultant Physical Health  ADL's:  Intact  Cognition: WNL  Sleep:  Number of Hours: 7.75   Treatment Plan Summary:  MDD: continue cymbalta 120 mg po q day and seroquel 300 mg po qhs.  No changes today as patient feels this regimen is beneficial.    Borderline personality: continue psychotherapy, will benefit from DBT.  Since that this hospitalization was caused by patient's maladaptive and manipulative behaviors towards others.  He did the overdose was triggered by being asked to leave the sister's house when the patient was  not yet prepared or willing to leave.  Despite her reports of  taking a large amount of clonazepam patient did not display symptoms consistent with an overdose on such a large amount of benzos.  Anxiety: will start vistaril 50 mg tid prn. Will no restart clonazepam as she OD on it.  RLS: continue requip 4 mg po qhs  Chronic pain-fibromyalgia: continue oxycodone 5 mg q 6 h prn  Tobacco use d/o: Continue nicotine patch 21 mg /day  Diet regular  Precautions every 15 minute checks  Hospitalization and status voluntary admission  Discharge disposition patient will be discharged once stable--likely tomorrow  Discharge follow-up she'll continue to follow with her therapist and with [facility] upon discharge  Labs: lipid panel,  hemoglobin A1c, TSH and prolactin---- all of these results are wnl  Hildred Priest, MD 08/12/2015, 9:20 AM
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
## 2015-08-12 NOTE — Progress Notes (Signed)
D:  Per pt self inventory pt reports sleeping good with sleep medication, appetite poor, energy level low, ability to pay attention poor, rates depression at an 8  out of 10, hopelessness at a 6 out of 10, anxiety at a 5 out of 10, denies SI/HI/AVH, goal today: "to go to group sessions and talk with my visitor tonight about the future"     A:  Emotional support provided, Encouraged pt to continue with treatment plan and attend all group activities, q15 min checks maintained for safety.  R:  Pt is receptive, going to groups, pleasant and cooperative with staff and other patients on the unit.

## 2015-08-13 NOTE — Tx Team (Signed)
Interdisciplinary Treatment Plan Update (Adult)  Date:  08/13/2015 Time Reviewed:  10:17 AM  Progress in Treatment: Attending groups: Yes. Participating in groups:  Yes. Taking medication as prescribed:  Yes. Tolerating medication:  Yes. Family/Significant othe contact made:  No, attempted to contact with no success twice and no returned call. Patient understands diagnosis:  Yes. Discussing patient identified problems/goals with staff:  Yes. Medical problems stabilized or resolved:  Yes. Denies suicidal/homicidal ideation: Yes. Issues/concerns per patient self-inventory:  Yes. Other:  New problem(s) identified: No, Describe:  none reported  Discharge Plan or Barriers: Patient will stabilize on medication and discharge home to her new apartment. Patient will follow up with outpatient services in [facility].  Reason for Continuation of Hospitalization: Depression Medication stabilization Suicidal ideation  Comments:  Estimated length of stay: 0 days, will discharge today Thursday 08/13/15  New goal(s):  Review of initial/current patient goals per problem list:   1.  Goal(s): Participate in aftercare plan   Met:  Yes  Target date: by discharge  As evidenced by: patient will participate in aftercare plan AEB aftercare provider and housing plan identified at discharge 08/11/15: Patient has an apartment she can discharge to but does not have outpatient provider at this time. CSW will assist patient in identifying a provider.  08/13/15: Patient has an outpatient provider identified and will discharge to her new apartment.  2.  Goal (s): Decrease depression   Met:  Yes  Target date: by discharge  As evidenced by: patient demonstrates decreased symptoms of depression and reports a Depression rating of 3 or less 08/11/15: patient reports no SI and will continue to monitor for depressive symptoms.  08/13/15: Patient is stable for discharge per MD.      Attendees: Physician:  Merlyn Albert, MD 3/30/201710:17 AM  Nursing:   Carolynn Sayers, RN 3/30/201710:17 AM  Other:  Carmell Austria, LCSWA 3/30/201710:17 AM  Other:   3/30/201710:17 AM  Other:   3/30/201710:17 AM  Other:  3/30/201710:17 AM  Other:  3/30/201710:17 AM  Other:  3/30/201710:17 AM  Other:  3/30/201710:17 AM  Other:  3/30/201710:17 AM  Other:  3/30/201710:17 AM  Other:   3/30/201710:17 AM   Scribe for Treatment Team:   Keene Breath, MSW, [place]  08/13/2015, 10:17 AM

## 2015-08-13 NOTE — Plan of Care (Signed)
Problem: Alteration in mood Goal: LTG-Patient reports reduction in suicidal thoughts (Patient reports reduction in suicidal thoughts and is able to verbalize a safety plan for whenever patient is feeling suicidal)  Outcome: Progressing Pt denies SI and agreed to contact staff if having SI

## 2015-08-13 NOTE — Progress Notes (Signed)
ER Culture Discharge Culture report review:  Patient presented to ED 07/2715 with overdose, depression, ?UTI  08/10/15 Urine cx: > 100,000 + E.coli - pan sensitive  Patient received Fosfomycin 3 gm po in ED.  Patient was transferred to [facility].   Chinita [place] PharmD Clinical Pharmacist 08/13/2015

## 2015-08-13 NOTE — BHH Suicide Risk Assessment (Signed)
[place] INPATIENT:  Family/Significant Other Suicide Prevention Education  Suicide Prevention Education:  Contact Attempts: Denaja Hohl (son) [phone] has been identified by the patient as the family member/significant other with whom the patient will be residing, and identified as the person(s) who will aid the patient in the event of a mental health crisis.  With written consent from the patient, two attempts were made to provide suicide prevention education, prior to and/or following the patient's discharge.  We were unsuccessful in providing suicide prevention education.  A suicide education pamphlet was given to the patient to share with family/significant other.  Date and time of first attempt: 08/12/15 12:04pm Date and time of second attempt: 08/13/15 9:45am  Carmell Austria T, MSW, LCSWA 08/13/2015, 10:15 AM

## 2015-08-13 NOTE — Progress Notes (Signed)
  [facility] Adult Case Management Discharge Plan :  Will you be returning to the same living situation after discharge:  No. Patient is moving into a new apartment living situation. At discharge, do you have transportation home?: Yes,  patient will be picked up by a friend Do you have the ability to pay for your medications: Yes,  patient has Medicaid  Release of information consent forms completed and in the chart;  Patient's signature needed at discharge.  Patient to Follow up at: Follow-up Information    Follow up with [facility]. Go on 08/19/2015.   Why:  For follow-up care appt, patient can walk in MWF 8-3. Patient is to be seen within 7 days of hospital discharge. Patient walk in appt is set for Wednesday 08/19/15 at 8:00am.   Contact information:   [address] [place], [place] [address] Ph [phone] Fax [phone] Sherrian Divers [phone]      Next level of care provider has access to [place] and Suicide Prevention discussed: Yes,  SPE discussed with patient and attempted to call her son twice with no return call received  Have you used any form of tobacco in the last 30 days? (Cigarettes, Smokeless Tobacco, Cigars, and/or Pipes): Yes  Has patient been referred to the Quitline?: Patient refused referral  Patient has been referred for addiction treatment: N/A  Keene Breath, MSW, [place] 08/13/2015, 10:19 AM [phone]

## 2015-08-13 NOTE — Discharge Summary (Signed)
Physician Discharge Summary Note  Patient:  Ann Bowen is an 60 y.o., female MRN:  [id number] DOB:  28-Jul-1955 Patient phone:  [phone] (home)  Patient address:   [place] [address],  Total Time spent with patient: 30 minutes  Date of Admission:  08/11/2015 Date of Discharge: 08/13/15  Reason for Admission:  S/p OD on klonopin  Principal Problem: MDD (major depressive disorder), recurrent episode [facility]) Discharge Diagnoses: Patient Active Problem List   Diagnosis Date Noted  . Borderline personality disorder [F60.3] 08/10/2015  . MDD (major depressive disorder), recurrent episode ([place]) [F33.9] 08/10/2015  . Fibromyalgia [M79.7] 08/10/2015  . Osteoarthritis [M19.90] 08/10/2015  . Tobacco use disorder [F17.200] 08/10/2015   Subjective:  Ann Bowen is a 60 y.o. female patient admitted with s/p OD.  HPI:   The patient is a 60 y.o. WF with MDD, borderline personality d/o, h/o fibromyalgia and MDD recurrent brought to the ED due to an overdose. She states that she's been depressed recently and feeling suicidal because she is about to be evicted from her sister home. This morning she was feeling so hopeless that she took 40 of her 1 mg Klonopin tablets in a suicide attempt at 10:00 AM today. She also took 2 Percocets.   No evidence of drowsiness, confusion, or sedation. Patient is completely alert and awake. Walking around without difficulty which is not consistent with an overdose on 40 clonazepam. Urine toxicology is positive for benzodiazepines, TCAs (patient is on Seroquel), negative for opiates, alcohol level is below detection limit.  Patient said for the last year she has been living with her sister. They have not been getting along and her sister have notify her a while back t that she needed to move out. Yesterday was the last date she had to move out of the house. The patient however states that she wasn't ready to move out as she has  been sick. She also felt that yesterday was not a good day because it was raining. She also thought that the sister had changed her mind because she had been acting real nice to her.  Patient has received a 59 b (from her sister) order which was served while she was in the emergency department.   Patient says she wants to be discharged by to her sister's house. She tells me that the [place] her 1 more week there; therefore she could return to her sister house and get ready to move to an apartment. Says she has paid the deposit already. She says that after taking the 40 tablets of clonazepam she was not even sedated or tired. She does not want to be hospitalized. She does not feel the medications need to be adjusted. She says if she is hospitalized she might lose the chance of moving into the Apartment.  She says her sister is doing all of this "bad things" to her because of the man. Patient explains that her sister hired a man to work in the property and this men favored the patient and not the patient's sister which created jealousy and conflict between them. The patient says she is in love with this man. She tried to explain to me why taking the overdose was an attempt to help this man as he had some legal charges and needed a stable address. Despite her explanation I was unable to understand what she meant. She says that these men has been in prisoned in the past and therefore he needed  to have a stable address, I assume he is on probation or parole. Patient believes that somehow by her taking the overdose was helping this man "because I'm trouble".  At this time patient is denying suicidal history, homicidality or having auditory or visual hallucinations.  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 Ms. Herzig took this job loss very personally and has not been able  to work in a sustained fashion since that time. Prior to the onset of fibromyalgia she tried cymbalta for depression though was unsuccessful and amitriptyline for which she cannot recall its effects;  Anger management issues: has broken 4-5 cell phones in anger, thrown antiques and plates, and been extremely hostile to people including her own daughter Ann Bowen such that she is now estranged from her. As noted she has had short fuse and outbursts (e.g. said "Fuck you Simona Huh" to her pastor who tried to joke with her about her missing tooth) which seem to support a non-mania explanation for her temper issues over the years. In 2016 she was in the hospital twice; she describes the first time she was cutting her wrist in context of SI related to Manuelito not inviting her to wedding, hospitalized for 3 days. The second time was the day after Holly's wedding, she saw pictures of the wedding and was upset, exacerbated by not being thanked for making her a quilt, SI by not SA, self-referred.   Substance Abuse: Tobacco: Denied Alcohol: Denied Illicit Drugs: Denied  Associated Signs/Symptoms: Depression Symptoms: depressed mood, suicidal attempt, (Hypo) Manic Symptoms: Impulsivity, Anxiety Symptoms: denies Psychotic Symptoms: denies PTSD Symptoms: NA   Past Psychiatric History: Patient carries a diagnosis of questionable bipolar disorder, borderline personality disorder and chronic pain. [facility] she follows up with Dr.Marks. Patient is also seeing a therapist here in [place].  Patient has been hospitalized a multitude of times in the past for multiple overdoses. Per records she has overdosed on Ambien, Xanax and Vicodin. Patient had a trial of unilateral ECT about a year and a half ago during a hospitalization in [place].  Inpatient: 4-5 x for SI/SA, first time 2005 Outpatient: Providers in [place]. Dr. Wylene Simmer at [place] in the past. Seeing now a  psychiatrist at [facility] Previously treated with Says she has tried multiple drugs, including several SSRI:s, Wellbutrin (caused a rash) and Venlafaxine, none of which have worked. Past Suicidal Ideation: OD on Ambien, Xanax, and Vicodin in 2010, requiring hospitalization.  Medication History: lyrica -- ineffective and sedating. Hydrocodone -- effective. abilify -- "made me crazy". No lithium. Lamictal -- reports no benefit. seroquel -- 50 mg was too strong, too sedating. fetzima -- no benefit. effexor XR -- remotely, can't recall. latuda -- stopped due to nausea. remeron -- weight gain when increased from 15-30; remeron and tegretol did not benefit her psychiatrically (was hospitalized twice).   Procedural History: ECT unilateral -- beneficial  Past Medical History: She describes the onset of diffuse pain around 2004-2005 after gastric bypass surgery, dx with fibromyalgia in 2006. HTN  Current Outpatient Prescriptions   . clonazePAM (KLONOPIN) 1 MG tablet One tablet twice a day and two tablets at bedtime. 120 tablet 3  . oxyCODONE-acetaminophen (PERCOCET) 10-325 mg tablet Take 0.5 tablets by mouth every 6 (six) hours as needed for Pain Earliest Fill Date: 07/13/15 60 tablet 0  . QUEtiapine (SEROQUEL) 300  MG tablet Take 1 tablet (300 mg total) by mouth nightly. 30 tablet 3  . rOPINIRole (REQUIP) 2 MG tablet Take 2 tablets (4 mg total) by mouth nightly. 60 tablet 3  Past Medical History  Diagnosis Date  . Shingles   . Arthritis   . Fibromyalgia   . Depression   . Borderline personality disorder     Past Surgical History  Procedure Laterality Date  . Gastric bypass    . Cholecystectomy    . Shoulder surgery     Family History:Both sisters have depression, daughter is seeing a Social worker.  Social History: Living Situation: [place] with her sister in her sister's property. Sister has asked the patient to leave. Relationship status: Divorced x 2 Important relationships: Sisters and son.  Daughter is estranged. Education: some college Legal: Denied Trauma History: None. Employment: Disabled History  Alcohol Use  . Yes    Comment: occasional     History  Drug Use No    Social History   Social History  . Marital Status: Divorced    Spouse Name: N/A  . Number of Children: N/A  . Years of Education: N/A   Social History Main Topics  . Smoking status: Current Every Day Smoker -- 0.50 packs/day    Types: Cigarettes  . Smokeless tobacco: Never Used  . Alcohol Use: Yes     Comment: occasional  . Drug Use: No  . Sexual Activity: Not Asked   Other Topics Concern  . None   Social History Narrative    Hospital Course:    No changes on medication regimen were made during this hospitalization with the exception of discontinuing the clonazepam.  MDD: continue cymbalta 120 mg po q day and seroquel 300 mg po qhs. No changes today as patient feels this regimen is beneficial.   Borderline personality: continue psychotherapy, will benefit from DBT. Since that this hospitalization was caused by patient's maladaptive and manipulative behaviors towards others. He did the overdose was triggered by being asked to leave the sister's house when the patient was not yet prepared or willing to leave. Despite her reports of taking a large amount of clonazepam patient did not display symptoms consistent with an overdose on such a large amount of benzos.  Anxiety: Patient received  vistaril 50 mg tid prn. Will no restart clonazepam as she OD on it.  RLS: continue requip 4 mg po qhs  Chronic pain-fibromyalgia: continue oxycodone 5 mg q 6 h prn  UTI: Treated in the emergency department  Tobacco use d/o: Patient received nicotine patch 21 mg /day  Discharge disposition patient will be discharged today  Discharge follow-up she'll continue to follow with her therapist and with [facility] upon discharge  Labs: lipid panel,  hemoglobin A1c, TSH and prolactin---- all of  these results are wnl  The patient did not display any unsafe or disruptive behaviors during her stay in the unit. The patient was pleasant and cooperative and participated in group therapy. There was no need for seclusion, restraints or forced medications. Patient had appropriate interactions with peers and staff.  On the day of the discharge the patient reported that she no longer was having any thoughts of suicide. She denied hopelessness, helplessness or having excessive guilt. She denied a problems with mood, appetite, energy, sleep or concentration. She denied suicidality, homicidality or having auditory or visual hallucinations. She denied side effects from her medications. She denied having any physical complaints.  Physical Findings: AIMS: Facial and Oral Movements Muscles  of Facial Expression: None, normal Lips and Perioral Area: None, normal Jaw: None, normal Tongue: None, normal,Extremity Movements Upper (arms, wrists, hands, fingers): None, normal Lower (legs, knees, ankles, toes): None, normal, Trunk Movements Neck, shoulders, hips: None, normal, Overall Severity Severity of abnormal movements (highest score from questions above): None, normal Incapacitation due to abnormal movements: None, normal Patient's awareness of abnormal movements (rate only patient's report): No Awareness, Dental Status Current problems with teeth and/or dentures?: No Does patient usually wear dentures?: No  CIWA:    COWS:     Musculoskeletal: Strength & Muscle Tone: within normal limits Gait & Station: normal Patient leans: N/A  Psychiatric Specialty Exam: Review of Systems  Constitutional: Negative.   HENT: Negative.   Eyes: Negative.   Respiratory: Negative.   Cardiovascular: Negative.   Gastrointestinal: Negative.   Genitourinary: Negative.   Musculoskeletal: Negative.   Skin: Negative.   Neurological: Negative.   Endo/Heme/Allergies: Negative.   Psychiatric/Behavioral: Negative.      Blood pressure 137/70, pulse 70, temperature 98.3 F (36.8 C), temperature source Oral, resp. rate 18, height 5' 3"  (1.6 m), weight 92.987 kg (205 lb), SpO2 100 %.Body mass index is 36.32 kg/(m^2).  General Appearance: Well Groomed  Engineer, water::  Good  Speech:  Clear and Coherent  Volume:  Normal  Mood:  Euthymic  Affect:  Appropriate  Thought Process:  Linear  Orientation:  Full (Time, Place, and Person)  Thought Content:  Hallucinations: None  Suicidal Thoughts:  No  Homicidal Thoughts:  No  Memory:  Immediate;   Good Recent;   Good Remote;   Good  Judgement:  Fair  Insight:  Shallow  Psychomotor Activity:  Normal  Concentration:  Good  Recall:  Good  Fund of Knowledge:Good  Language: Good  Akathisia:  No  Handed:    AIMS (if indicated):     Assets:  Warehouse manager Resources/Insurance  ADL's:  Intact  Cognition: WNL  Sleep:  Number of Hours: 6.25   Have you used any form of tobacco in the last 30 days? (Cigarettes, Smokeless Tobacco, Cigars, and/or Pipes): Yes  Has this patient used any form of tobacco in the last 30 days? (Cigarettes, Smokeless Tobacco, Cigars, and/or Pipes) Yes, Yes, A prescription for an FDA-approved tobacco cessation medication was offered at discharge and the patient refused  Blood Alcohol level:  Lab Results  Component Value Date   [facility] <5 [id number]/59/1638    Metabolic Disorder Labs:  Lab Results  Component Value Date   HGBA1C 4.4 08/11/2015   Lab Results  Component Value Date   PROLACTIN 2.3* 08/11/2015   Lab Results  Component Value Date   CHOL 134 08/11/2015   TRIG 136 08/11/2015   HDL 47 08/11/2015   CHOLHDL 2.9 08/11/2015   VLDL 27 08/11/2015   LDLCALC 60 08/11/2015   Results for ZELLA, DEWAN (MRN [id number]) as of 08/13/2015 09:23  Ref. Range 08/10/2015 13:01 08/11/2015 06:55  Sodium Latest Ref Range: 135-145 mmol/L 141   Potassium Latest Ref Range: 3.5-5.1 mmol/L 4.6   Chloride Latest Ref Range: 101-111  mmol/L 113 (H)   CO2 Latest Ref Range: 22-32 mmol/L 25   BUN Latest Ref Range: 6-20 mg/dL 7   Creatinine Latest Ref Range: 0.44-1.00 mg/dL 0.74   Calcium Latest Ref Range: 8.9-10.3 mg/dL 8.7 (L)   EGFR (Non-African Amer.) Latest Ref Range: >60 mL/min >60   EGFR (African American) Latest Ref Range: >60 mL/min >60   Glucose Latest Ref Range: 65-99 mg/dL 101 (H)  Anion gap Latest Ref Range: 5-15  3 (L)   Alkaline Phosphatase Latest Ref Range: 38-126 U/L 82   Albumin Latest Ref Range: 3.5-5.0 g/dL 3.5   AST Latest Ref Range: 15-41 U/L 36   ALT Latest Ref Range: 14-54 U/L 16   Total Protein Latest Ref Range: 6.5-8.1 g/dL 6.4 (L)   Total Bilirubin Latest Ref Range: 0.3-1.2 mg/dL 1.3 (H)   Cholesterol Latest Ref Range: 0-200 mg/dL  134  Triglycerides Latest Ref Range: <150 mg/dL  136  HDL Cholesterol Latest Ref Range: >40 mg/dL  47  LDL (calc) Latest Ref Range: 0-99 mg/dL  60  VLDL Latest Ref Range: 0-40 mg/dL  27  Total CHOL/HDL Ratio Latest Units: RATIO  2.9  WBC Latest Ref Range: 3.6-11.0 K/uL 5.2   RBC Latest Ref Range: 3.80-5.20 MIL/uL 4.12   Hemoglobin Latest Ref Range: 12.0-16.0 g/dL 10.0 (L)   HCT Latest Ref Range: 35.0-47.0 % 30.5 (L)   MCV Latest Ref Range: 80.0-100.0 fL 74.1 (L)   MCH Latest Ref Range: 26.0-34.0 pg 24.3 (L)   MCHC Latest Ref Range: 32.0-36.0 g/dL 32.8   RDW Latest Ref Range: 11.5-14.5 % 16.7 (H)   Platelets Latest Ref Range: 150-440 K/uL 284   Neutrophils Latest Units: % 60   Lymphocytes Latest Units: % 31   Monocytes Relative Latest Units: % 7   Eosinophil Latest Units: % 1   Basophil Latest Units: % 1   NEUT# Latest Ref Range: 1.4-6.5 K/uL 3.1   Lymphocyte # Latest Ref Range: 1.0-3.6 K/uL 1.6   Monocyte # Latest Ref Range: 0.2-0.9 K/uL 0.4   Eosinophils Absolute Latest Ref Range: 0-0.7 K/uL 0.1   Basophils Absolute Latest Ref Range: 0-0.1 K/uL 0.0   Acetaminophen (Tylenol), S Latest Ref Range: 10-30 ug/mL <99 (L)   Salicylate Lvl Latest Ref Range:  2.8-30.0 mg/dL <4.0   Prolactin Latest Ref Range: 4.8-23.3 ng/mL  2.3 (L)  Hemoglobin A1C Latest Ref Range: 4.0-6.0 %  4.4  Appearance Latest Ref Range: CLEAR  HAZY (A)   Bacteria, UA Latest Ref Range: NONE SEEN  RARE (A)   Bilirubin Urine Latest Ref Range: NEGATIVE  NEGATIVE   Color, Urine Latest Ref Range: YELLOW  YELLOW (A)   Glucose Latest Ref Range: NEGATIVE mg/dL NEGATIVE   Hgb urine dipstick Latest Ref Range: NEGATIVE  NEGATIVE   Ketones, ur Latest Ref Range: NEGATIVE mg/dL NEGATIVE   Leukocytes, UA Latest Ref Range: NEGATIVE  2+ (A)   Nitrite Latest Ref Range: NEGATIVE  POSITIVE (A)   pH Latest Ref Range: 5.0-8.0  7.0   Protein Latest Ref Range: NEGATIVE mg/dL NEGATIVE   RBC / HPF Latest Ref Range: 0-5 RBC/hpf NONE SEEN   Specific Gravity, Urine Latest Ref Range: 1.005-1.030  1.003 (L)   Squamous Epithelial / LPF Latest Ref Range: NONE SEEN  0-5 (A)   WBC, UA Latest Ref Range: 0-5 WBC/hpf 6-30   Alcohol, Ethyl (B) Latest Ref Range: <5 mg/dL <5   Amphetamines, Ur Screen Latest Ref Range: NONE DETECTED  NONE DETECTED   Barbiturates, Ur Screen Latest Ref Range: NONE DETECTED  NONE DETECTED   Benzodiazepine, Ur Scrn Latest Ref Range: NONE DETECTED  POSITIVE (A)   Cocaine Metabolite,Ur [place] Latest Ref Range: NONE DETECTED  NONE DETECTED   Methadone Scn, Ur Latest Ref Range: NONE DETECTED  NONE DETECTED   MDMA (Ecstasy)Ur Screen Latest Ref Range: NONE DETECTED  NONE DETECTED   Cannabinoid 50 Ng, Ur [place] Latest Ref Range: NONE DETECTED  NONE DETECTED   Opiate, Ur Screen Latest Ref Range: NONE DETECTED  NONE DETECTED   Phencyclidine (PCP) Ur S Latest Ref Range: NONE DETECTED  NONE DETECTED   Tricyclic, Ur Screen Latest Ref Range: NONE DETECTED  POSITIVE (A)   Organism ID, Bacteria Unknown ESCHERICHIA COLI   URINE CULTURE Unknown Rpt    See Psychiatric Specialty Exam and Suicide Risk Assessment completed by Attending Physician prior to discharge.  Discharge destination:  Home  Is  patient on multiple antipsychotic therapies at discharge:  No   Has Patient had three or more failed trials of antipsychotic monotherapy by history:  No  Recommended Plan for Multiple Antipsychotic Therapies: NA     Medication List    STOP taking these medications        clonazePAM 1 MG tablet  Commonly known as:  KLONOPIN     naproxen 500 MG tablet  Commonly known as:  NAPROSYN     predniSONE 10 MG tablet  Commonly known as:  DELTASONE      TAKE these medications      Indication   DULoxetine 60 MG capsule  Commonly known as:  CYMBALTA  Take 2 capsules (120 mg total) by mouth daily.  Notes to Patient:  depression      oxyCODONE-acetaminophen 10-325 MG tablet  Commonly known as:  PERCOCET  Start taking on:  09/20/2015  Notes to Patient:  Chronic pain      QUEtiapine 300 MG tablet  Commonly known as:  SEROQUEL  Take by mouth.  Notes to Patient:  Depression and insomnia      rOPINIRole 4 MG tablet  Commonly known as:  REQUIP  Take 1 tablet (4 mg total) by mouth daily after supper.  Notes to Patient:  RLS            Follow-up Information    Follow up with [facility]. Go on 08/13/2015.   Why:  For follow-up care appt with Dr. Holland Falling pager [phone]   Contact information:   [address] [place], [place] [address] Ph [phone] Fax        Signed: Hildred Priest, MD 08/13/2015, 9:22 AM

## 2015-08-13 NOTE — Progress Notes (Signed)
D: Observed pt in day room interacting. Patient alert and oriented x4. Patient denies SI/HI/AVH. Pt affect is depressed and anxious. Pt rated depression 7/10 and anxiety 8/10. Pt related that current level of depression and anxiety is due to sister possibly placing a restraining order on pt. Pt stater her day "was good" apart from concerns with the restraining order. Pt c/o of left shoulder pain from pneumonia injection. A: Offered active listening and support. Provided therapeutic communication. Administered scheduled medications. Offered pain medication. R: Pt pleasant and cooperative. Pt wanted to try to relax and sleep instead of taking pain medication. Pt medication compliant. Will continue Q15 min. checks. Safety maintained.

## 2015-08-13 NOTE — [facility] Suicide Risk Assessment (Signed)
[facility] Discharge Suicide Risk Assessment   Principal Problem: MDD (major depressive disorder), recurrent episode [facility]) Discharge Diagnoses:  Patient Active Problem List   Diagnosis Date Noted  . Borderline personality disorder [F60.3] 08/10/2015  . MDD (major depressive disorder), recurrent episode ([place]) [F33.9] 08/10/2015  . Fibromyalgia [M79.7] 08/10/2015  . Osteoarthritis [M19.90] 08/10/2015  . Tobacco use disorder [F17.200] 08/10/2015    ROS  Blood pressure 137/70, pulse 70, temperature 98.3 F (36.8 C), temperature source Oral, resp. rate 18, height 5\' 3"  (1.6 m), weight 92.987 kg (205 lb), SpO2 100 %.Body mass index is 36.32 kg/(m^2).                                                       Mental Status Per Nursing Assessment::   On Admission:  NA  Demographic Factors:  Caucasian  Loss Factors: Loss of significant relationship and Legal issues  Historical Factors: Prior suicide attempts and Impulsivity  Risk Reduction Factors:   Positive social support  Continued Clinical Symptoms:  Personality Disorders:   Cluster B Chronic Pain Previous Psychiatric Diagnoses and Treatments  Cognitive Features That Contribute To Risk:  None    Suicide Risk:  Minimal: No identifiable suicidal ideation.  Patients presenting with no risk factors but with morbid ruminations; may be classified as minimal risk based on the severity of the depressive symptoms  Follow-up Information    Follow up with [facility]. Go on 08/13/2015.   Why:  For follow-up care appt with Dr. Holland Falling pager [phone]   Contact information:   [address] [place], [place] [address] Ph [phone] Fax       Hildred Priest, MD 08/13/2015, 9:21 AM

## 2015-08-13 NOTE — Progress Notes (Addendum)
Patient with appropriate affect, cooperative behavior with meals, meds and plan of care. No SI/HI at this time. Patient attends am therapy group. No SI/HI at this time. Patient to discharge today when discharge plan and transportation in place. Takes prn atarax for anxiety with good effect. Safety maintained. Patient discharged at this time to friend via private car. Verbalizes understanding rt recommended plan of care. Acknowledges all belongings have been returned. Safety maintained.

## 2015-09-23 DIAGNOSIS — M2550 Pain in unspecified joint: Secondary | ICD-10-CM | POA: Insufficient documentation

## 2015-10-25 ENCOUNTER — Encounter: Payer: Self-pay | Admitting: Emergency Medicine

## 2015-10-25 ENCOUNTER — Emergency Department
Admission: EM | Admit: 2015-10-25 | Discharge: 2015-10-25 | Disposition: A | Discharge status: Home / Self Care | Payer: Medicaid Other | Attending: Emergency Medicine | Admitting: Emergency Medicine

## 2015-10-25 ENCOUNTER — Emergency Department: Payer: Medicaid Other

## 2015-10-25 DIAGNOSIS — S6991XA Unspecified injury of right wrist, hand and finger(s), initial encounter: Secondary | ICD-10-CM | POA: Diagnosis present

## 2015-10-25 DIAGNOSIS — Y929 Unspecified place or not applicable: Secondary | ICD-10-CM | POA: Diagnosis not present

## 2015-10-25 DIAGNOSIS — Y939 Activity, unspecified: Secondary | ICD-10-CM | POA: Insufficient documentation

## 2015-10-25 DIAGNOSIS — M1990 Unspecified osteoarthritis, unspecified site: Secondary | ICD-10-CM | POA: Insufficient documentation

## 2015-10-25 DIAGNOSIS — S62630A Displaced fracture of distal phalanx of right index finger, initial encounter for closed fracture: Secondary | ICD-10-CM | POA: Insufficient documentation

## 2015-10-25 DIAGNOSIS — Y999 Unspecified external cause status: Secondary | ICD-10-CM | POA: Diagnosis not present

## 2015-10-25 DIAGNOSIS — W231XXA Caught, crushed, jammed, or pinched between stationary objects, initial encounter: Secondary | ICD-10-CM | POA: Diagnosis not present

## 2015-10-25 DIAGNOSIS — S67190A Crushing injury of right index finger, initial encounter: Secondary | ICD-10-CM

## 2015-10-25 DIAGNOSIS — S61210A Laceration without foreign body of right index finger without damage to nail, initial encounter: Secondary | ICD-10-CM

## 2015-10-25 IMAGING — CR DG FINGER INDEX 2+V*R*
1 series · 3 of 3 positions shown · non-contrast
Comparison: None.

CLINICAL DATA: Right index finger slammed in car door. Distal
finger pain bruising. Crush injury.

EXAM:
RIGHT INDEX FINGER 2+V

[Series 1: dg finger index right · 0.14mm/px · 3 of 3 slices shown]
[im 1/3]
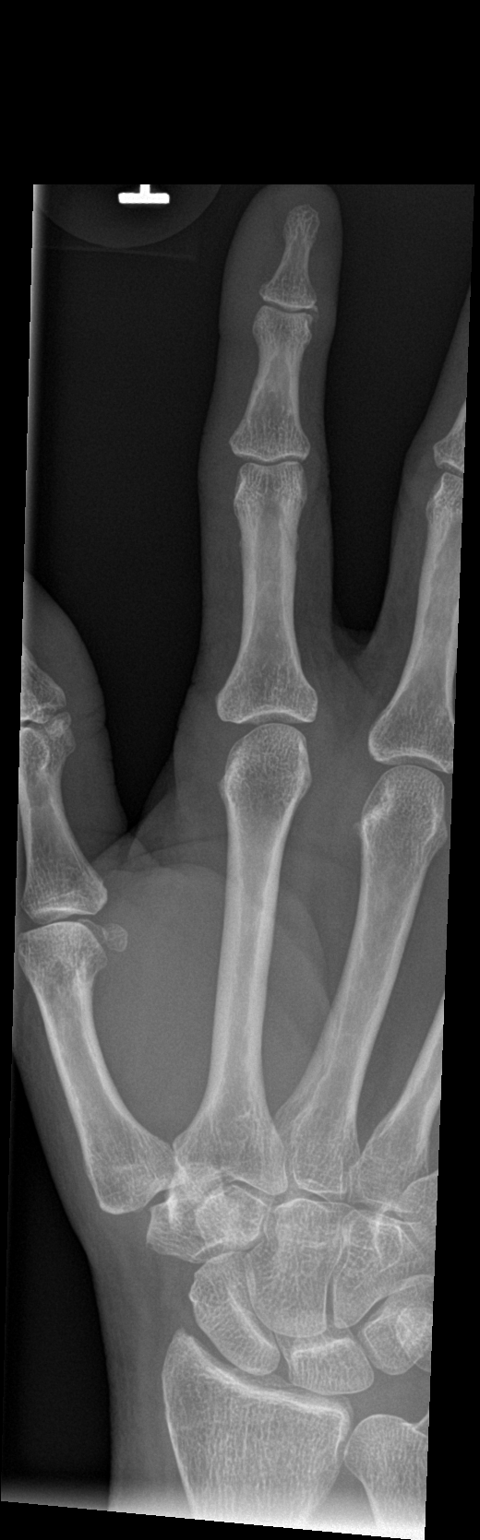
[im 2/3]
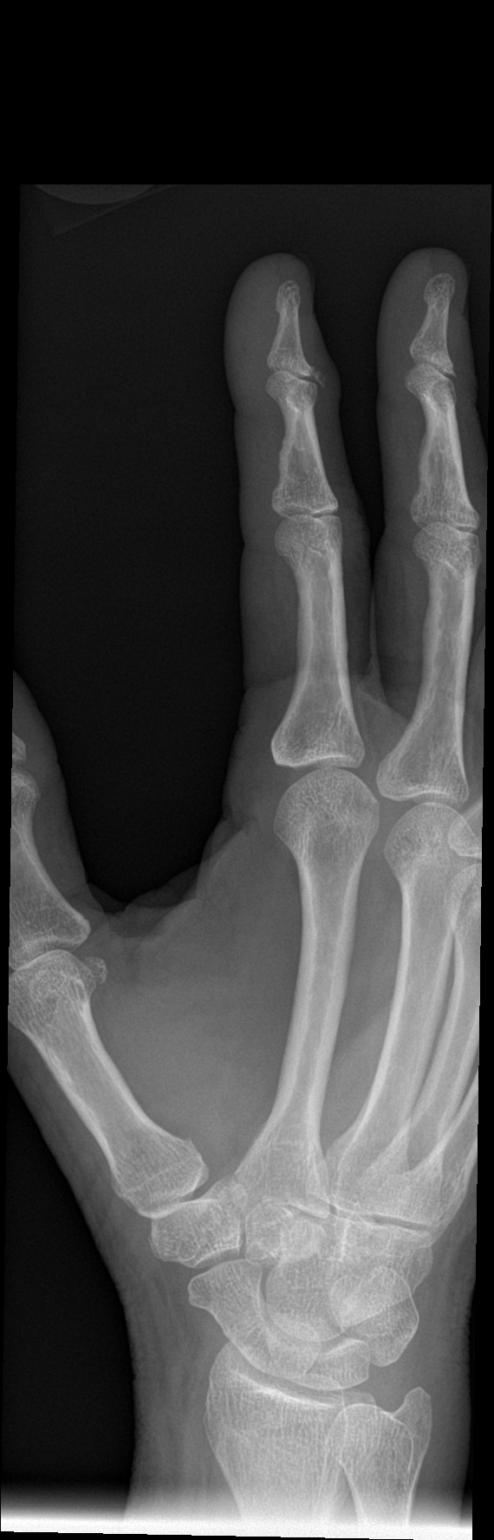
[im 3/3]
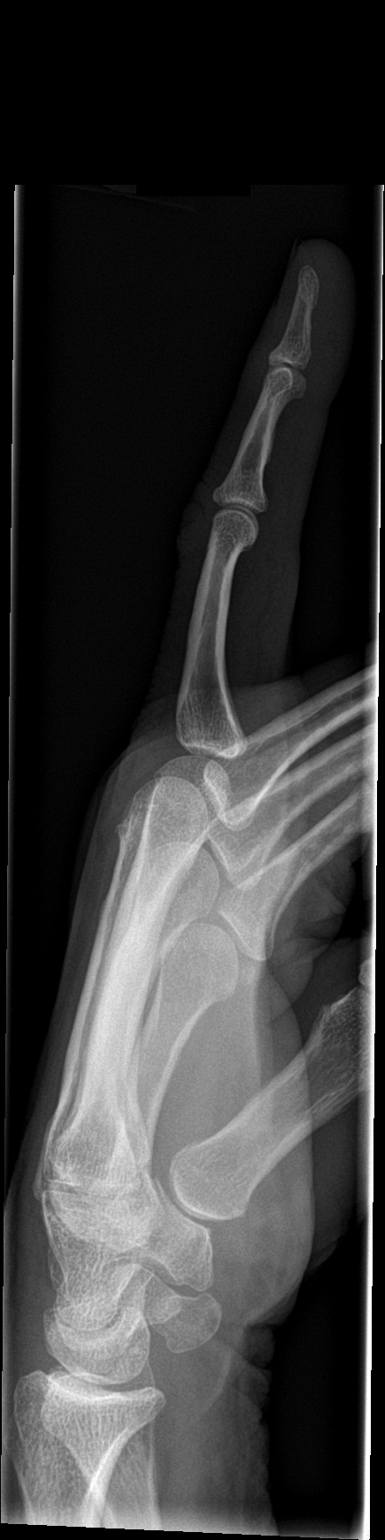

[3 of 3 positions shown; findings below may reference images not displayed]

FINDINGS: Extensive soft tissue swelling is present in the distal aspect of
the digit. There is a minimally displaced fracture along the ulnar
aspect of the base of the distal phalanx. No additional fractures
are present. There is no radiopaque foreign body.
IMPRESSION: 1. Minimally displaced fracture along the ulnar aspect of the base
of the distal phalanx.
2. Extensive soft tissue swelling is present.

## 2015-10-25 MED ORDER — TETANUS-DIPHTH-ACELL PERTUSSIS 5-2.5-18.5 LF-MCG/0.5 IM SUSP
0.5000 mL | Freq: Once | INTRAMUSCULAR | Status: DC
  Filled 2015-10-25: qty 0.5

## 2015-10-25 MED ORDER — HYDROCODONE-ACETAMINOPHEN 5-325 MG PO TABS: 1.0000 | ORAL_TABLET | Freq: Four times a day (QID) | ORAL | Status: DC | PRN

## 2015-10-25 NOTE — Discharge Instructions (Signed)
Crush Injury, Fingers or Toes A crush injury to the fingers or toes means the tissues have been damaged by being squeezed (compressed). There will be bleeding into the tissues and swelling. Often, blood will collect under the skin. When this happens, the skin on the finger often dies and may slough off (shed) 1 week to 10 days later. Usually, new skin is growing underneath. If the injury has been too severe and the tissue does not survive, the damaged tissue may begin to turn black over several days.  Wounds which occur because of the crushing may be stitched (sutured) shut. However, crush injuries are more likely to become infected than other injuries.These wounds may not be closed as tightly as other types of cuts to prevent infection. Nails involved are often lost. These usually grow back over several weeks.  DIAGNOSIS X-rays may be taken to see if there is any injury to the bones. TREATMENT Broken bones (fractures) may be treated with splinting, depending on the fracture. Often, no treatment is required for fractures of the last bone in the fingers or toes. HOME CARE INSTRUCTIONS   The crushed part should be raised (elevated) above the heart or center of the chest as much as possible for the first several days or as directed. This helps with pain and lessens swelling. Less swelling increases the chances that the crushed part will survive.  Put ice on the injured area.  Put ice in a plastic bag.  Place a towel between your skin and the bag.  Leave the ice on for 15-20 minutes, 03-04 times a day for the first 2 days.  Only take over-the-counter or prescription medicines for pain, discomfort, or fever as directed by your caregiver.  Use your injured part only as directed.  Change your bandages (dressings) as directed.  Keep all follow-up appointments as directed by your caregiver. Not keeping your appointment could result in a chronic or permanent injury, pain, and disability. If there is  any problem keeping the appointment, you must call to reschedule. SEEK IMMEDIATE MEDICAL CARE IF:   There is redness, swelling, or increasing pain in the wound area.  Pus is coming from the wound.  You have a fever.  You notice a bad smell coming from the wound or dressing.  The edges of the wound do not stay together after the sutures have been removed.  You are unable to move the injured finger or toe. MAKE SURE YOU:   Understand these instructions.  Will watch your condition.  Will get help right away if you are not doing well or get worse.   This information is not intended to replace advice given to you by your health care provider. Make sure you discuss any questions you have with your health care provider.   Document Released: 05/02/2005 Document Revised: 07/25/2011 Document Reviewed: 09/17/2010 Elsevier Interactive Patient Education 2016 [place] or Splint Care Casts and splints support injured limbs and keep bones from moving while they heal.  HOME CARE  Keep the cast or splint uncovered during the drying period.  A plaster cast can take 24 to 48 hours to dry.  A fiberglass cast will dry in less than 1 hour.  Do not rest the cast on anything harder than a pillow for 24 hours.  Do not put weight on your injured limb. Do not put pressure on the cast. Wait for your doctor's approval.  Keep the cast or splint dry.  Cover the cast or splint  with a plastic bag during baths or wet weather.  If you have a cast over your chest and belly (trunk), take sponge baths until the cast is taken off.  If your cast gets wet, dry it with a towel or blow dryer. Use the cool setting on the blow dryer.  Keep your cast or splint clean. Wash a dirty cast with a damp cloth.  Do not put any objects under your cast or splint.  Do not scratch the skin under the cast with an object. If itching is a problem, use a blow dryer on a cool setting over the itchy area.  Do not  trim or cut your cast.  Do not take out the padding from inside your cast.  Exercise your joints near the cast as told by your doctor.  Raise (elevate) your injured limb on 1 or 2 pillows for the first 1 to 3 days. GET HELP IF:  Your cast or splint cracks.  Your cast or splint is too tight or too loose.  You itch badly under the cast.  Your cast gets wet or has a soft spot.  You have a bad smell coming from the cast.  You get an object stuck under the cast.  Your skin around the cast becomes red or sore.  You have new or more pain after the cast is put on. GET HELP RIGHT AWAY IF:  You have fluid leaking through the cast.  You cannot move your fingers or toes.  Your fingers or toes turn blue or white or are cool, painful, or puffy (swollen).  You have tingling or lose feeling (numbness) around the injured area.  You have bad pain or pressure under the cast.  You have trouble breathing or have shortness of breath.  You have chest pain.   This information is not intended to replace advice given to you by your health care provider. Make sure you discuss any questions you have with your health care provider.   Document Released: 09/01/2010 Document Revised: 01/02/2013 Document Reviewed: 11/08/2012 Elsevier Interactive Patient Education 2016 [employer]  Finger Fracture Finger fractures are breaks in the bones of the fingers. There are many types of fractures. There are also different ways of treating these fractures. Your doctor will talk with you about the best way to treat your fracture. Injury is the main cause of broken fingers. This includes:  Injuries while playing sports.  Workplace injuries.  Falls. HOME CARE  Follow your doctor's instructions for:  Activities.  Exercises.  Physical therapy.  Take medicines only as told by your doctor for pain, discomfort, or fever. GET HELP IF: You have pain or swelling that limits:  The motion of your  fingers.  The use of your fingers. GET HELP RIGHT AWAY IF:  You cannot feel your fingers, or your fingers become numb.   This information is not intended to replace advice given to you by your health care provider. Make sure you discuss any questions you have with your health care provider.   Document Released: 10/19/2007 Document Revised: 05/23/2014 Document Reviewed: 12/12/2012 Elsevier Interactive Patient Education 2016 [place]. Please follow-up with Dr. Theodore Demark office in 5-7 days for repeat x-rays and evaluation of your wound

## 2015-10-25 NOTE — ED Provider Notes (Signed)
CSN: [id number]     Arrival date & time 10/25/15  2027 History   First MD Initiated Contact with Patient 10/25/15 2108     Chief Complaint  Patient presents with  . Laceration  . Hand Pain     (Consider location/radiation/quality/duration/timing/severity/associated sxs/prior Treatment) HPI  60 year old female presents to the emergency department for evaluation of right index finger laceration and pain. Patient slammed her right index finger in a car door just prior to arrival. She has 2 small lacerations on the volar aspect of the index finger as well as pain. Pain is moderate. She's not had any medications for pain. She denies any numbness or tingling. No limited range of motion with flexion or extension of the DIP joint. Tetanus is not up-to-date.  Past Medical History  Diagnosis Date  . Arthritis    Past Surgical History  Procedure Laterality Date  . Cholecystectomy    . Gastric bypass    . Shoulder surgery     No family history on file. Social History  Substance Use Topics  . Smoking status: Never Smoker   . Smokeless tobacco: None  . Alcohol Use: Yes     Comment: occasionaly   OB History    No data available     Review of Systems  Constitutional: Negative for fever, chills, activity change and fatigue.  HENT: Negative for congestion, sinus pressure and sore throat.   Eyes: Negative for visual disturbance.  Respiratory: Negative for cough, chest tightness and shortness of breath.   Cardiovascular: Negative for chest pain and leg swelling.  Gastrointestinal: Negative for nausea, vomiting, abdominal pain and diarrhea.  Genitourinary: Negative for dysuria.  Musculoskeletal: Positive for arthralgias. Negative for gait problem.  Skin: Positive for wound. Negative for rash.  Neurological: Negative for weakness, numbness and headaches.  Hematological: Negative for adenopathy.  Psychiatric/Behavioral: Negative for behavioral problems, confusion and agitation.       Allergies  Review of patient's allergies indicates no known allergies.  Home Medications   Prior to Admission medications   Medication Sig Start Date End Date Taking? Authorizing Provider  HYDROcodone-acetaminophen (NORCO) 5-325 MG tablet Take 1 tablet by mouth every 6 (six) hours as needed for moderate pain. 10/25/15   Duanne Guess, PA-C   BP 144/84 mmHg  Pulse 80  Temp(Src) 97.9 F (36.6 C) (Oral)  Resp 18  Ht 5\' 3"  (1.6 m)  Wt 88.451 kg  BMI 34.55 kg/m2  SpO2 99% Physical Exam  Constitutional: She is oriented to person, place, and time. She appears well-developed and well-nourished. No distress.  HENT:  Head: Normocephalic and atraumatic.  Mouth/Throat: Oropharynx is clear and moist.  Eyes: EOM are normal. Pupils are equal, round, and reactive to light. Right eye exhibits no discharge. Left eye exhibits no discharge.  Neck: Normal range of motion. Neck supple.  Cardiovascular: Normal rate, regular rhythm and intact distal pulses.   Pulmonary/Chest: Effort normal. No respiratory distress.  Musculoskeletal:  Examination of the right index finger shows patient has 2 small 0.5 similar lacerations on the volar aspect of the finger. There is no sign of foreign body. No significant drainage. The nail is fully intact with no sign of subungual hematoma. Patient has full active extension and slightly limited flexion of the DIP joint.  Neurological: She is alert and oriented to person, place, and time. She has normal reflexes.  Skin: Skin is warm and dry.  Psychiatric: She has a normal mood and affect. Her behavior is normal. Thought content normal.  ED Course  Procedures (including critical care time) LACERATION REPAIR Performed by: Feliberto Gottron Authorized by: Feliberto Gottron Consent: Verbal consent obtained. Risks and benefits: risks, benefits and alternatives were discussed Consent given by: patient Patient identity confirmed: provided  demographic data Prepped and Draped in normal sterile fashion Wound explored  Laceration Location: Right index finger  Laceration Length: 0.5 cm, 0.5 cm  No Foreign Bodies seen or palpated  Anesthesia: local infiltration  Local anesthetic: None Irrigation method: syringe Amount of cleaning: standard  Skin closure: Dermabond   Number of sutures: 0   Technique: Dermabond   Patient tolerance: Patient tolerated the procedure well with no immediate complications.  SPLINT APPLICATION Date/Time: [id number] PM Authorized by: Feliberto Gottron Consent: Verbal consent obtained. Risks and benefits: risks, benefits and alternatives were discussed Consent given by: patient Splint applied by: ED tech Location details: Right index finger  Splint type: Aluminum/foam  Supplies used: Aluminum/foam splint with buddy tape  Post-procedure: The splinted body part was neurovascularly unchanged following the procedure. Patient tolerance: Patient tolerated the procedure well with no immediate complications.     Labs Review Labs Reviewed - No data to display  Imaging Review Dg Finger Index Right  10/25/2015  CLINICAL DATA:  Right index finger slammed in car door. Distal finger pain bruising. Crush injury. EXAM: RIGHT INDEX FINGER 2+V COMPARISON:  None. FINDINGS: Extensive soft tissue swelling is present in the distal aspect of the digit. There is a minimally displaced fracture along the ulnar aspect of the base of the distal phalanx. No additional fractures are present. There is no radiopaque foreign body. IMPRESSION: 1. Minimally displaced fracture along the ulnar aspect of the base of the distal phalanx. 2. Extensive soft tissue swelling is present. Electronically Signed   By: San Morelle M.D.   On: 10/25/2015 21:47   I have personally reviewed and evaluated these images and lab results as part of my medical decision-making.   EKG Interpretation None      MDM   Final  diagnoses:  Laceration of right index finger w/o foreign body w/o damage to nail, initial encounter  Closed displaced fracture of distal phalanx of right index finger, initial encounter  Crushing injury of right index finger, initial encounter    60 year old female with crush injury to the right index finger. She suffered 2 small laceration 0.5 cm in length. Laceration repaired with Dermabond. She is placed into a finger splint due to distal phalanx fracture. And pain. She'll follow-up with orthopedics in 5-7 days. LS needed for pain.    Duanne Guess, PA-C 10/25/15 2231  Lavonia Drafts, MD 10/25/15 [phone]

## 2015-10-25 NOTE — ED Notes (Signed)
Patient states that she slammed her right index finger in the car door. Patient with laceration to finger. Bleeding controlled at this time.

## 2015-11-06 ENCOUNTER — Encounter: Payer: Self-pay | Admitting: Emergency Medicine

## 2016-01-11 ENCOUNTER — Emergency Department
Admission: EM | Admit: 2016-01-11 | Discharge: 2016-01-11 | Disposition: A | Discharge status: Home / Self Care | Payer: Medicaid Other | Attending: Emergency Medicine | Admitting: Emergency Medicine

## 2016-01-11 ENCOUNTER — Encounter: Payer: Self-pay | Admitting: Emergency Medicine

## 2016-01-11 DIAGNOSIS — R1084 Generalized abdominal pain: Secondary | ICD-10-CM | POA: Diagnosis present

## 2016-01-11 DIAGNOSIS — R197 Diarrhea, unspecified: Secondary | ICD-10-CM | POA: Diagnosis not present

## 2016-01-11 DIAGNOSIS — Z79899 Other long term (current) drug therapy: Secondary | ICD-10-CM | POA: Insufficient documentation

## 2016-01-11 LAB — URINALYSIS COMPLETE WITH MICROSCOPIC (ARMC ONLY)
Bilirubin Urine: NEGATIVE
Glucose, UA: NEGATIVE mg/dL
HGB URINE DIPSTICK: NEGATIVE
Ketones, ur: NEGATIVE mg/dL
NITRITE: NEGATIVE
PROTEIN: NEGATIVE mg/dL
SPECIFIC GRAVITY, URINE: 1.008 (ref 1.005–1.030)
pH: 5 (ref 5.0–8.0)

## 2016-01-11 LAB — GASTROINTESTINAL PANEL BY PCR, STOOL (REPLACES STOOL CULTURE)
ADENOVIRUS F40/41: NOT DETECTED
ASTROVIRUS: NOT DETECTED
CAMPYLOBACTER SPECIES: NOT DETECTED
Cryptosporidium: NOT DETECTED
Cyclospora cayetanensis: NOT DETECTED
E. coli O157: NOT DETECTED
ENTEROAGGREGATIVE E COLI (EAEC): NOT DETECTED
ENTEROPATHOGENIC E COLI (EPEC): NOT DETECTED
Entamoeba histolytica: NOT DETECTED
Enterotoxigenic E coli (ETEC): NOT DETECTED
GIARDIA LAMBLIA: NOT DETECTED
NOROVIRUS GI/GII: NOT DETECTED
PLESIMONAS SHIGELLOIDES: NOT DETECTED
Rotavirus A: NOT DETECTED
SHIGA LIKE TOXIN PRODUCING E COLI (STEC): NOT DETECTED
Salmonella species: NOT DETECTED
Sapovirus (I, II, IV, and V): NOT DETECTED
Shigella/Enteroinvasive E coli (EIEC): NOT DETECTED
Vibrio cholerae: NOT DETECTED
Vibrio species: NOT DETECTED
Yersinia enterocolitica: NOT DETECTED

## 2016-01-11 LAB — CBC
HEMATOCRIT: 38.8 % (ref 35.0–47.0)
HEMOGLOBIN: 13.5 g/dL (ref 12.0–16.0)
MCH: 28.7 pg (ref 26.0–34.0)
MCHC: 34.8 g/dL (ref 32.0–36.0)
MCV: 82.5 fL (ref 80.0–100.0)
Platelets: 222 10*3/uL (ref 150–440)
RBC: 4.71 MIL/uL (ref 3.80–5.20)
RDW: 15.3 % — AB (ref 11.5–14.5)
WBC: 6.6 10*3/uL (ref 3.6–11.0)

## 2016-01-11 LAB — COMPREHENSIVE METABOLIC PANEL
ALBUMIN: 3.4 g/dL — AB (ref 3.5–5.0)
ALT: 13 U/L — ABNORMAL LOW (ref 14–54)
AST: 24 U/L (ref 15–41)
Alkaline Phosphatase: 62 U/L (ref 38–126)
Anion gap: 10 (ref 5–15)
BILIRUBIN TOTAL: 0.7 mg/dL (ref 0.3–1.2)
BUN: 8 mg/dL (ref 6–20)
CHLORIDE: 108 mmol/L (ref 101–111)
CO2: 22 mmol/L (ref 22–32)
Calcium: 8.7 mg/dL — ABNORMAL LOW (ref 8.9–10.3)
Creatinine, Ser: 0.88 mg/dL (ref 0.44–1.00)
GFR calc Af Amer: >60 mL/min (ref >60)
GFR calc non Af Amer: >60 mL/min (ref >60)
GLUCOSE: 155 mg/dL — AB (ref 65–99)
POTASSIUM: 3.7 mmol/L (ref 3.5–5.1)
Sodium: 140 mmol/L (ref 135–145)
TOTAL PROTEIN: 6 g/dL — AB (ref 6.5–8.1)

## 2016-01-11 LAB — LIPASE, BLOOD: LIPASE: 32 U/L (ref 11–51)

## 2016-01-11 MED ORDER — DICYCLOMINE HCL 20 MG PO TABS: 20.0000 mg | ORAL_TABLET | Freq: Three times a day (TID) | ORAL | 0 refills | Status: DC | PRN

## 2016-01-11 NOTE — ED Notes (Signed)
Pt left prior to discharge, left note asking for discharge and prescription to be mailed; this RN called and left voicemail to return to ED for discharge paper work and presctiption.

## 2016-01-11 NOTE — Discharge Instructions (Signed)
Please seek medical attention for any high fevers, chest pain, shortness of breath, change in behavior, persistent vomiting, bloody stool or any other new or concerning symptoms.  

## 2016-01-11 NOTE — ED Triage Notes (Signed)
Pt presents with diarrhea, abd pain and bloating for two mths on and off. Pt had her water tested and came back poistive for ecoli.

## 2016-01-11 NOTE — ED Provider Notes (Signed)
[facility] Emergency Department Provider Note  ____________________________________________   I have reviewed the triage vital signs and the nursing notes.   HISTORY  Chief Complaint Abdominal Pain and Diarrhea   History limited by: Not Limited   HPI Ann Bowen is a 60 y.o. female who presents to the emergency department today because of concerns for abdominal pain and bloating and on and off diarrhea. The symptoms have been going on for the past 2-3 months. She describes a sense of generalized bloating and cramping. She has also had times of diarrhea interspersed with times of constipation. Initially she feels like she has had some low-grade fevers and at different times during this time.. The water at her house was checked today and was positive for Escherichia coli. This caused her concern.    Past Medical History:  Diagnosis Date  . Arthritis   . Borderline personality disorder   . Depression   . Fibromyalgia   . Shingles     Patient Active Problem List   Diagnosis Date Noted  . Borderline personality disorder 08/10/2015  . MDD (major depressive disorder), recurrent episode ([place]) 08/10/2015  . Fibromyalgia 08/10/2015  . Osteoarthritis 08/10/2015  . Tobacco use disorder 08/10/2015    Past Surgical History:  Procedure Laterality Date  . CHOLECYSTECTOMY    . GASTRIC BYPASS    . SHOULDER SURGERY      Prior to Admission medications   Medication Sig Start Date End Date Taking? Authorizing Provider  DULoxetine (CYMBALTA) 60 MG capsule Take 2 capsules (120 mg total) by mouth daily. 08/12/15   Hildred Priest, MD  HYDROcodone-acetaminophen (NORCO) 5-325 MG tablet Take 1 tablet by mouth every 6 (six) hours as needed for moderate pain. 10/25/15   Duanne Guess, PA-C  oxyCODONE-acetaminophen (PERCOCET) 10-325 MG tablet  09/20/15   Historical Provider, MD  QUEtiapine (SEROQUEL) 300 MG tablet Take by mouth. 03/16/15   Historical Provider, MD   rOPINIRole (REQUIP) 4 MG tablet Take 1 tablet (4 mg total) by mouth daily after supper. 08/12/15   Hildred Priest, MD    Allergies Wellbutrin [bupropion]  No family history on file.  Social History Social History  Substance Use Topics  . Smoking status: Never Smoker  . Smokeless tobacco: Never Used  . Alcohol use Yes     Comment: occasionaly    Review of Systems  Constitutional: Negative for fever. Cardiovascular: Negative for chest pain. Respiratory: Negative for shortness of breath. Gastrointestinal: Positive for abdominal cramping. Neurological: Negative for headaches, focal weakness or numbness.  10-point ROS otherwise negative.  ____________________________________________   PHYSICAL EXAM:  VITAL SIGNS: ED Triage Vitals  Enc Vitals Group     BP 01/11/16 1409 (!) 158/81     Pulse Rate 01/11/16 1408 84     Resp 01/11/16 1408 20     Temp 01/11/16 1408 98.2 F (36.8 C)     Temp Source 01/11/16 1408 Oral     SpO2 01/11/16 1618 94 %     Weight 01/11/16 1408 195 lb (88.5 kg)     Height 01/11/16 1408 5\' 3"  (1.6 m)     Head Circumference --      Peak Flow --      Pain Score 01/11/16 1414 4   Constitutional: Alert and oriented. Well appearing and in no distress. Eyes: Conjunctivae are normal. PERRL. Normal extraocular movements. ENT   Head: Normocephalic and atraumatic.   Nose: No congestion/rhinnorhea.   Mouth/Throat: Mucous membranes are moist.   Neck: No  stridor. Hematological/Lymphatic/Immunilogical: No cervical lymphadenopathy. Cardiovascular: Normal rate, regular rhythm.  No murmurs, rubs, or gallops. Respiratory: Normal respiratory effort without tachypnea nor retractions. Breath sounds are clear and equal bilaterally. No wheezes/rales/rhonchi. Gastrointestinal: Soft and Very minimally diffusely tender to palpation. No distention appreciated. No rebound. No guarding. Genitourinary: Deferred Musculoskeletal: Normal range of motion  in all extremities. No joint effusions.  No lower extremity tenderness nor edema. Neurologic:  Normal speech and language. No gross focal neurologic deficits are appreciated.  Skin:  Skin is warm, dry and intact. No rash noted. Psychiatric: Mood and affect are normal. Speech and behavior are normal. Patient exhibits appropriate insight and judgment.  ____________________________________________    LABS (pertinent positives/negatives)  Labs Reviewed  COMPREHENSIVE METABOLIC PANEL - Abnormal; Notable for the following:       Result Value   Glucose, Bld 155 (*)    Calcium 8.7 (*)    Total Protein 6.0 (*)    Albumin 3.4 (*)    ALT 13 (*)    All other components within normal limits  CBC - Abnormal; Notable for the following:    RDW 15.3 (*)    All other components within normal limits  GASTROINTESTINAL PANEL BY PCR, STOOL (REPLACES STOOL CULTURE)  LIPASE, BLOOD  URINALYSIS COMPLETEWITH MICROSCOPIC ([facility] ONLY)     ____________________________________________   EKG  None  ____________________________________________    RADIOLOGY  None  ____________________________________________   PROCEDURES  Procedures  ____________________________________________   INITIAL IMPRESSION / ASSESSMENT AND PLAN / ED COURSE  Pertinent labs & imaging results that were available during my care of the patient were reviewed by me and considered in my medical decision making (see chart for details).  Patient presents to the emergency department today after 2-3 months of abdominal symptoms. Patient stated that her water source was positive for Escherichia coli. Patient stool sample here negative for Escherichia coli. Will discharge home on [place] and have patient follow-up GI.  ____________________________________________   FINAL CLINICAL IMPRESSION(S) / ED DIAGNOSES  Final diagnoses:  Generalized abdominal pain     Note: This dictation was prepared with Dragon dictation. Any  transcriptional errors that result from this process are unintentional    Nance Pear, MD 01/11/16 1725

## 2016-05-22 ENCOUNTER — Emergency Department: Payer: Medicaid Other

## 2016-05-22 ENCOUNTER — Emergency Department
Admission: EM | Admit: 2016-05-22 | Discharge: 2016-05-22 | Disposition: A | Discharge status: Home / Self Care | Payer: Medicaid Other | Attending: Emergency Medicine | Admitting: Emergency Medicine

## 2016-05-22 ENCOUNTER — Encounter: Payer: Self-pay | Admitting: Emergency Medicine

## 2016-05-22 DIAGNOSIS — J069 Acute upper respiratory infection, unspecified: Secondary | ICD-10-CM | POA: Insufficient documentation

## 2016-05-22 DIAGNOSIS — J40 Bronchitis, not specified as acute or chronic: Secondary | ICD-10-CM | POA: Insufficient documentation

## 2016-05-22 DIAGNOSIS — Z79899 Other long term (current) drug therapy: Secondary | ICD-10-CM | POA: Diagnosis not present

## 2016-05-22 DIAGNOSIS — R05 Cough: Secondary | ICD-10-CM | POA: Diagnosis present

## 2016-05-22 LAB — RAPID INFLUENZA A&B ANTIGENS
Influenza A (ARMC): NEGATIVE
Influenza B (ARMC): NEGATIVE

## 2016-05-22 IMAGING — CR DG CHEST 2V
1 series · 2 of 2 positions shown · non-contrast
Comparison: No priors.

CLINICAL DATA: 60-year-old female with history of cold like
symptoms for 1 week. Fatigue.

EXAM:
CHEST  2 VIEW

[Series 1: dg chest 2 view · 0.14mm/px · 2 of 2 slices shown]
[im 1/2]
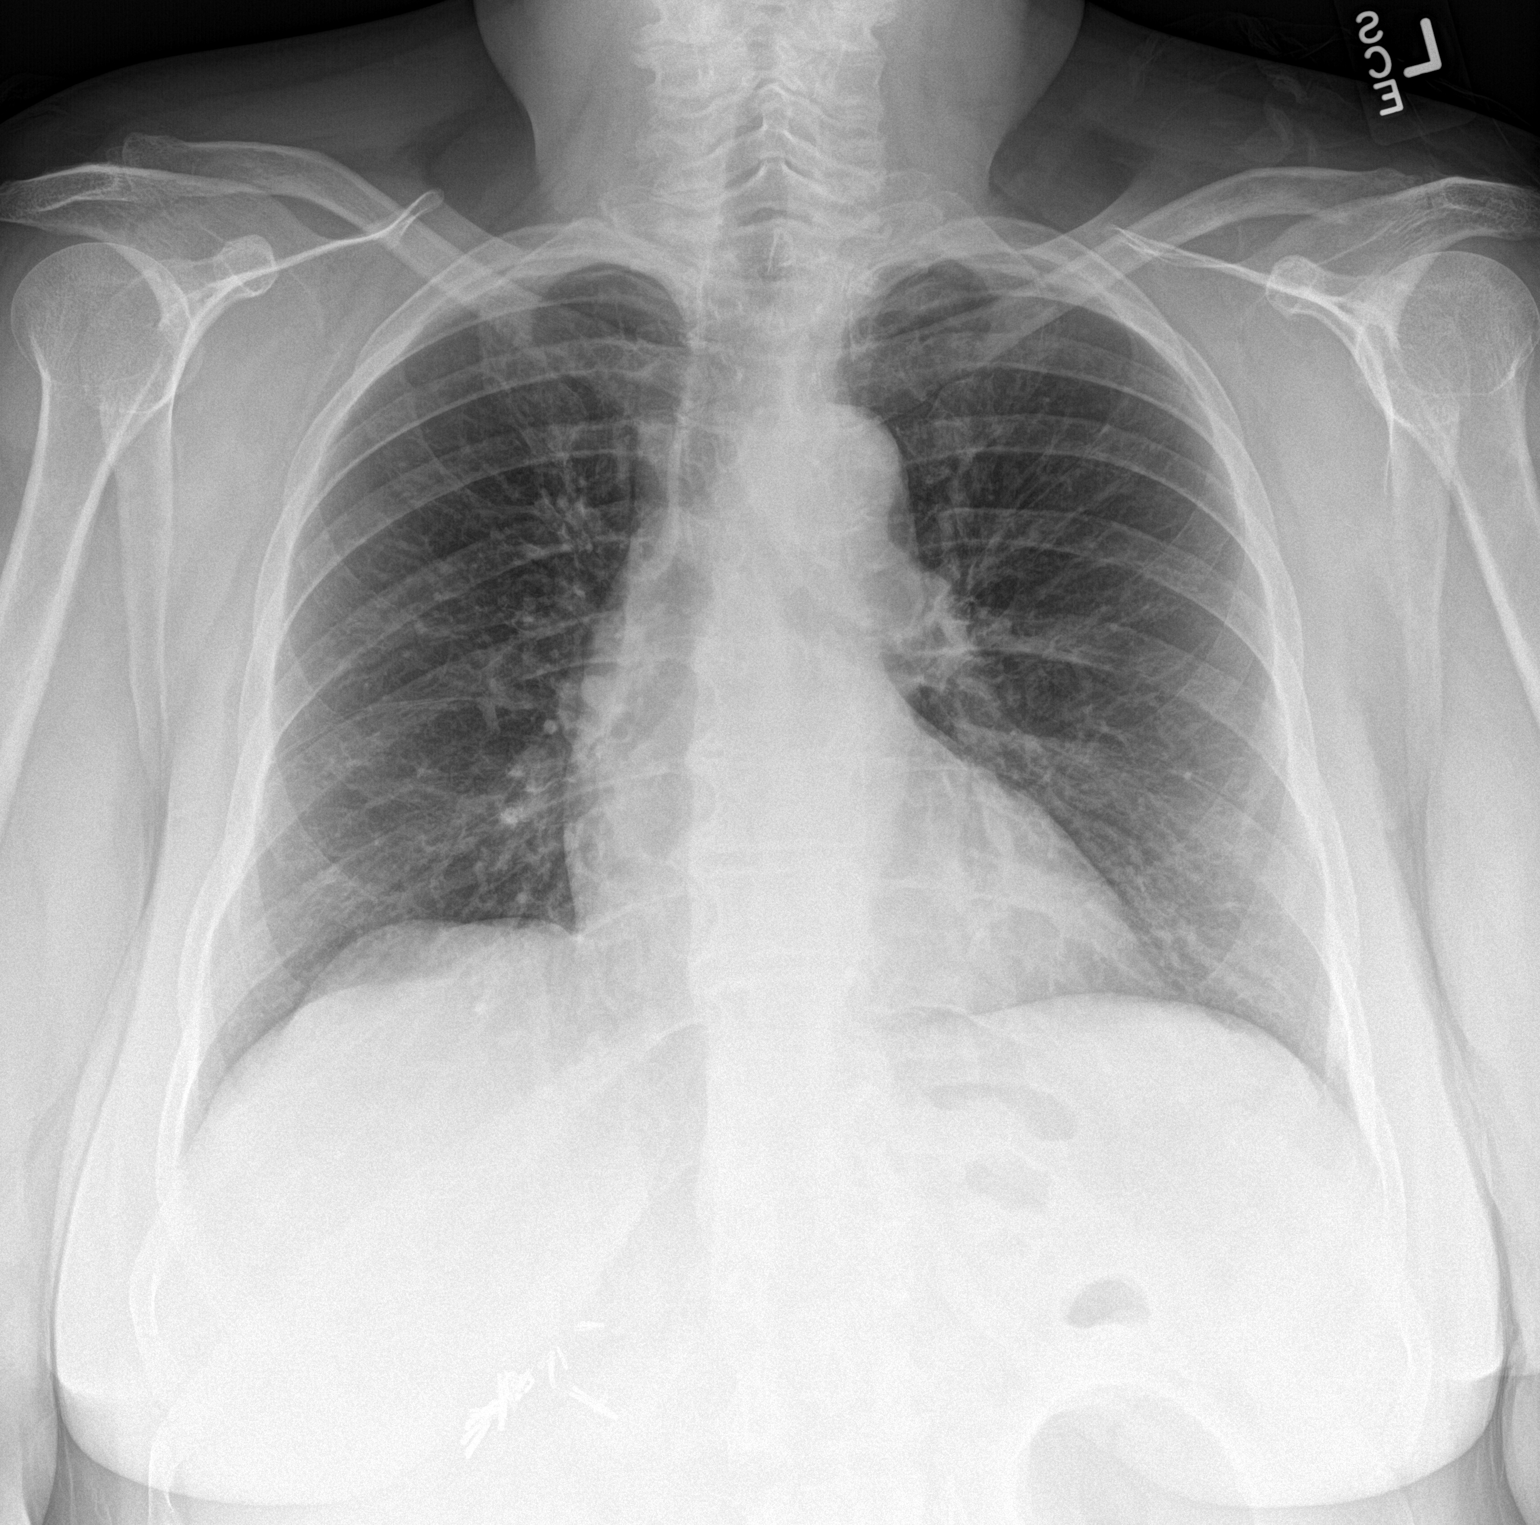
[im 2/2]
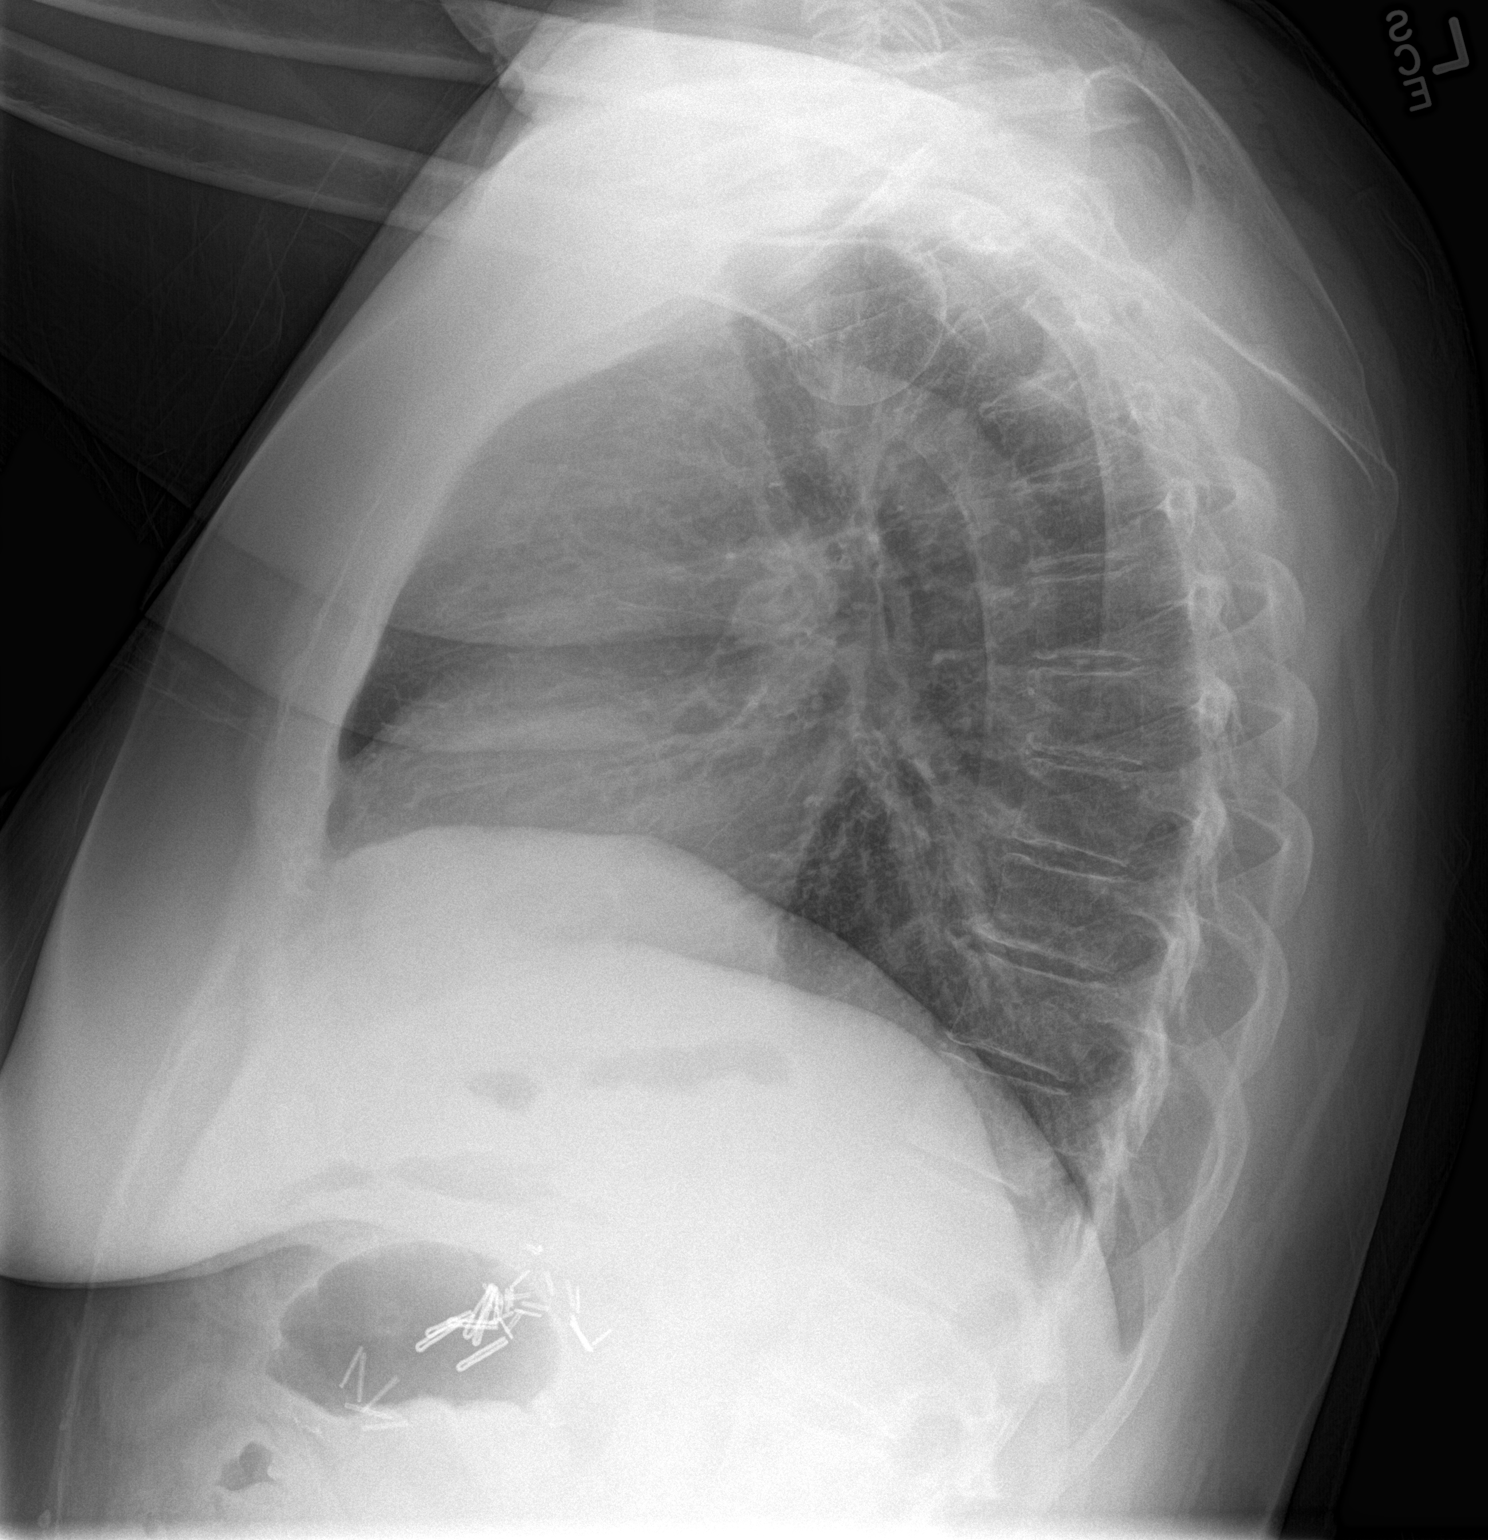

[2 of 2 positions shown; findings below may reference images not displayed]

FINDINGS: Lung volumes are normal. No consolidative airspace disease. No
pleural effusions. No pneumothorax. No pulmonary nodule or mass
noted. Pulmonary vasculature and the cardiomediastinal silhouette
are within normal limits. Atherosclerosis in the thoracic aorta.
Numerous surgical clips are noted projecting over the right upper
quadrant of the abdomen, likely from prior cholecystectomy.
IMPRESSION: 1.  No radiographic evidence of acute cardiopulmonary disease.
2. Aortic atherosclerosis.

## 2016-05-22 MED ORDER — IPRATROPIUM-ALBUTEROL 0.5-2.5 (3) MG/3ML IN SOLN
3.0000 mL | Freq: Once | RESPIRATORY_TRACT | Status: AC
  Administered 2016-05-22: 3 mL via RESPIRATORY_TRACT
  Filled 2016-05-22: qty 3

## 2016-05-22 MED ORDER — BENZONATATE 100 MG PO CAPS: 100.0000 mg | ORAL_CAPSULE | Freq: Three times a day (TID) | ORAL | 0 refills | Status: DC | PRN

## 2016-05-22 MED ORDER — ALBUTEROL SULFATE HFA 108 (90 BASE) MCG/ACT IN AERS: 2.0000 | INHALATION_SPRAY | Freq: Four times a day (QID) | RESPIRATORY_TRACT | 0 refills | Status: DC | PRN

## 2016-05-22 MED ORDER — DOXYCYCLINE HYCLATE 100 MG PO TABS: 100.0000 mg | ORAL_TABLET | Freq: Two times a day (BID) | ORAL | 0 refills | Status: DC

## 2016-05-22 NOTE — ED Notes (Addendum)
FIRST NURSE NOTE: Pt reports cough sxs for the past week, reports fevers and worsening sxs. Cough worse at night, worried about PNA.  Also c/o sore throat and ear pain.

## 2016-05-22 NOTE — ED Triage Notes (Signed)
States has had cough and fever for about one week.  Patient had felt that she was getting better, but last night symptoms worsened, coughing up blood tinged sputum.  Patient has had history of pneumonia.

## 2016-05-22 NOTE — ED Notes (Signed)
See triage note  States she had cold sx's for about 1 week   Thought she was getting better but sx's returned last pm having prod cough,body aches bilateral ear pain and sinus pressure .Marland Kitchen

## 2016-05-22 NOTE — Discharge Instructions (Signed)
Take the prescription meds as directed. Follow-up with your provide rfor continued symptoms. Increase fluid intake and use a room humidifier for cough relief.

## 2016-05-22 NOTE — ED Provider Notes (Signed)
[facility] Emergency Department Provider Note ____________________________________________  Time seen: 1427  I have reviewed the triage vital signs and the nursing notes.  HISTORY  Chief Complaint  Cough and Fever  HPI Ann Bowen is a 61 y.o. female presents to the ED with complaints of cough and fever for about a week. The patient thought she was getting better but last night symptoms worsen sharply. She describes coughing up blood-tinged sputum today. She has had body aches, ear pain, and sinus pressure. She has a history of pneumonia.She takes Percocet daily for chronic pain.  Past Medical History:  Diagnosis Date  . Arthritis   . Borderline personality disorder   . Depression   . Fibromyalgia   . Shingles     Patient Active Problem List   Diagnosis Date Noted  . Borderline personality disorder 08/10/2015  . MDD (major depressive disorder), recurrent episode ([place]) 08/10/2015  . Fibromyalgia 08/10/2015  . Osteoarthritis 08/10/2015  . Tobacco use disorder 08/10/2015    Past Surgical History:  Procedure Laterality Date  . CHOLECYSTECTOMY    . GASTRIC BYPASS    . SHOULDER SURGERY      Prior to Admission medications   Medication Sig Start Date End Date Taking? Authorizing Provider  clonazePAM (KLONOPIN) 1 MG tablet Take 1 mg by mouth 2 (two) times daily.   Yes Historical Provider, MD  albuterol (PROVENTIL HFA;VENTOLIN HFA) 108 (90 Base) MCG/ACT inhaler Inhale 2 puffs into the lungs every 6 (six) hours as needed for wheezing or shortness of breath. 05/22/16   Michaeleen Down V Bacon Nickol Collister, PA-C  benzonatate (TESSALON PERLES) 100 MG capsule Take 1 capsule (100 mg total) by mouth 3 (three) times daily as needed for cough (Take 1-2 per dose). 05/22/16   Laycee Fitzsimmons V Bacon Traeson Dusza, PA-C  dicyclomine (BENTYL) 20 MG tablet Take 1 tablet (20 mg total) by mouth 3 (three) times daily as needed (abdominal pain). 01/11/16   Nance Pear, MD  doxycycline (VIBRA-TABS) 100  MG tablet Take 1 tablet (100 mg total) by mouth 2 (two) times daily. 05/22/16   Zuleika Gallus V Bacon Corrin Sieling, PA-C  DULoxetine (CYMBALTA) 60 MG capsule Take 2 capsules (120 mg total) by mouth daily. 08/12/15   Hildred Priest, MD  HYDROcodone-acetaminophen (NORCO) 5-325 MG tablet Take 1 tablet by mouth every 6 (six) hours as needed for moderate pain. 10/25/15   Duanne Guess, PA-C  oxyCODONE-acetaminophen (PERCOCET) 10-325 MG tablet  09/20/15   Historical Provider, MD  QUEtiapine (SEROQUEL) 300 MG tablet Take by mouth. 03/16/15   Historical Provider, MD  rOPINIRole (REQUIP) 4 MG tablet Take 1 tablet (4 mg total) by mouth daily after supper. 08/12/15   Hildred Priest, MD    Allergies Wellbutrin [bupropion]  No family history on file.  Social History Social History  Substance Use Topics  . Smoking status: Never Smoker  . Smokeless tobacco: Never Used  . Alcohol use Yes     Comment: occasionaly    Review of Systems  Constitutional: Positive for subjective fever. Eyes: Negative for visual changes. ENT: Negative for sore throat. Reports blood-tinged nasal mucus. Cardiovascular: Negative for chest pain. Respiratory: Positive for shortness of breath. Reports blood-tinged sputum. Gastrointestinal: Negative for abdominal pain, vomiting and diarrhea. Genitourinary: Negative for dysuria. Musculoskeletal: Negative for back pain. Skin: Negative for rash. Neurological: Negative for headaches, focal weakness or numbness. ____________________________________________  PHYSICAL EXAM:  VITAL SIGNS: ED Triage Vitals  Enc Vitals Group     BP 05/22/16 1350 (!) 146/76  Pulse Rate 05/22/16 1350 75     Resp 05/22/16 1350 16     Temp 05/22/16 1350 98.1 F (36.7 C)     Temp Source 05/22/16 1350 Oral     SpO2 05/22/16 1350 97 %     Weight 05/22/16 1349 205 lb (93 kg)     Height 05/22/16 1349 5\' 3"  (1.6 m)     Head Circumference --      Peak Flow --      Pain Score 05/22/16 1349 0      Pain Loc --      Pain Edu? --      Excl. in [place]? --     Constitutional: Alert and oriented. Well appearing and in no distress. Head: Normocephalic and atraumatic. Eyes: Conjunctivae are normal. PERRL. Normal extraocular movements Ears: Canals clear. TMs intact bilaterally. Nose: No congestion/rhinorrhea/epistaxis. Nasal turbinates are erythematous, flat and dry. Mouth/Throat: Mucous membranes are moist. Uvula is midline and tonsils are flat. No oropharyngeal erythema or tonsillar exudate appreciated. Neck: Supple. No thyromegaly. Hematological/Lymphatic/Immunological: No cervical lymphadenopathy. Cardiovascular: Normal rate, regular rhythm. Normal distal pulses. Respiratory: Normal respiratory effort. No wheezes/rales/rhonchi. Gastrointestinal: Soft and nontender. No distention. Musculoskeletal: Nontender with normal range of motion in all extremities.  ____________________________________________   LABS (pertinent positives/negatives) Labs Reviewed  RAPID INFLUENZA A&B ANTIGENS ([facility] ONLY)  ____________________________________________   RADIOLOGY  CXR IMPRESSION: 1.  No radiographic evidence of acute cardiopulmonary disease. 2. Aortic atherosclerosis. ____________________________________________  PROCEDURES  DuoNeb x 1 ____________________________________________  INITIAL IMPRESSION / ASSESSMENT AND PLAN / ED COURSE  Patient reassured by her negative chest x-ray at this time. She is also please with a negative flu swab. She is discharged with a diagnosis of likely viral URI. Given her history of pneumonia and a change in her symptomology, she'll be discharged with a script for doxycycline. She is advised that she may hold the prescription for several days pending improvement of her symptoms. She should however complete the regimen if she chooses to start it. She is also given prescriptions for albuterol inhaler as well as Tessalon Perles. She is advised to use  over-the-counter Delsym or Mucinex for symptom relief. She'll follow with primary care provider or return to the ED for acutely worsening symptoms.  Clinical Course    ____________________________________________  FINAL CLINICAL IMPRESSION(S) / ED DIAGNOSES  Final diagnoses:  Viral upper respiratory tract infection  Bronchitis      Melvenia Needles, PA-C 05/22/16 1815    Lisa Roca, MD 05/22/16 2140

## 2016-06-21 ENCOUNTER — Ambulatory Visit: Payer: Self-pay

## 2016-08-04 ENCOUNTER — Emergency Department
Admission: EM | Admit: 2016-08-04 | Discharge: 2016-08-04 | Disposition: A | Discharge status: Home / Self Care | Payer: Medicaid Other | Attending: Emergency Medicine | Admitting: Emergency Medicine

## 2016-08-04 ENCOUNTER — Encounter: Payer: Self-pay | Admitting: Emergency Medicine

## 2016-08-04 DIAGNOSIS — L03313 Cellulitis of chest wall: Secondary | ICD-10-CM

## 2016-08-04 DIAGNOSIS — R21 Rash and other nonspecific skin eruption: Secondary | ICD-10-CM

## 2016-08-04 MED ORDER — HYDROXYZINE HCL 10 MG PO TABS: 10.0000 mg | ORAL_TABLET | Freq: Three times a day (TID) | ORAL | 0 refills | Status: DC | PRN

## 2016-08-04 MED ORDER — CEPHALEXIN 500 MG PO CAPS: 500.0000 mg | ORAL_CAPSULE | Freq: Three times a day (TID) | ORAL | 0 refills | Status: DC

## 2016-08-04 MED ORDER — FLUCONAZOLE 150 MG PO TABS: 150.0000 mg | ORAL_TABLET | Freq: Once | ORAL | 0 refills | Status: AC

## 2016-08-04 MED ORDER — PREDNISONE 20 MG PO TABS: 60.0000 mg | ORAL_TABLET | Freq: Every day | ORAL | 0 refills | Status: DC

## 2016-08-04 MED ORDER — FLUCONAZOLE 50 MG PO TABS
150.0000 mg | ORAL_TABLET | Freq: Once | ORAL | Status: AC
  Administered 2016-08-04: 150 mg via ORAL
  Filled 2016-08-04: qty 1

## 2016-08-04 MED ORDER — PREDNISONE 20 MG PO TABS
60.0000 mg | ORAL_TABLET | Freq: Once | ORAL | Status: AC
  Administered 2016-08-04: 60 mg via ORAL
  Filled 2016-08-04: qty 3

## 2016-08-04 MED ORDER — CEPHALEXIN 500 MG PO CAPS
500.0000 mg | ORAL_CAPSULE | Freq: Once | ORAL | Status: AC
  Administered 2016-08-04: 500 mg via ORAL
  Filled 2016-08-04: qty 1

## 2016-08-04 NOTE — ED Notes (Signed)

## 2016-08-04 NOTE — ED Triage Notes (Addendum)
Patient ambulatory to triage with steady gait, without difficulty or distress noted; pt reports generalized itchy rash since Sunday with no known cause; benadryl 2 tabs taken 2hrs PTA without relief

## 2016-08-04 NOTE — Discharge Instructions (Signed)
Your rash appears to be a combination of several different factors.  We have treated you for possible infection of her skin below the right breast (possible cellulitis), possible candidal (yeast) infection, and allergic reaction.  Please take the prescribed medications as indicated on the labels.  Please follow up at the [place] at the next available opportunity.    Return to the emergency department if you develop new or worsening symptoms that concern you.

## 2016-08-04 NOTE — ED Provider Notes (Signed)
[facility] Emergency Department Provider Note  ____________________________________________   First MD Initiated Contact with Patient 08/04/16 [phone]     (approximate)  I have reviewed the triage vital signs and the nursing notes.   HISTORY  Chief Complaint Rash    HPI Ann Bowen is a 61 y.o. female with medical history as listed below who presents for evaluation of gradually worsening generalized itching red rash that has been worsening over the last 4-5 days.  She does not know why it started; she has not changed any cleaning products, started any new medications, acquired any new pets, or moved to a different location.  She did notice it after she worked outside on the day prior to the onset of the rash and she developed some raised bumps initially to her arms, face, abdomen, and legs initially which have spread further over most of her body.  Additionally she has an extremely red and warm to the touch rash under her right breast which she did not notice previously that is somewhat painful to the touch and looks different than the rest of them.  The itching is severe and she has opened several wounds due to the scratching.  She denies fever/chills, chest pain, shortness of breath, nausea, vomiting, abdominal pain, dysuria.  Benadryl was not making it better and nothing makes it worse.  She denies any involvement of her mouth or throat.  She has never had an allergic reaction like this in the past.  She has no involvement on her palms nor soles.   Past Medical History:  Diagnosis Date  . Arthritis   . Borderline personality disorder   . Depression   . Fibromyalgia   . Shingles     Patient Active Problem List   Diagnosis Date Noted  . Borderline personality disorder 08/10/2015  . MDD (major depressive disorder), recurrent episode ([place]) 08/10/2015  . Fibromyalgia 08/10/2015  . Osteoarthritis 08/10/2015  . Tobacco use disorder 08/10/2015    Past  Surgical History:  Procedure Laterality Date  . CHOLECYSTECTOMY    . GASTRIC BYPASS    . SHOULDER SURGERY      Prior to Admission medications   Medication Sig Start Date End Date Taking? Authorizing Provider  albuterol (PROVENTIL HFA;VENTOLIN HFA) 108 (90 Base) MCG/ACT inhaler Inhale 2 puffs into the lungs every 6 (six) hours as needed for wheezing or shortness of breath. 05/22/16   Jenise V Bacon Menshew, PA-C  benzonatate (TESSALON PERLES) 100 MG capsule Take 1 capsule (100 mg total) by mouth 3 (three) times daily as needed for cough (Take 1-2 per dose). 05/22/16   Jenise V Bacon Menshew, PA-C  cephALEXin (KEFLEX) 500 MG capsule Take 1 capsule (500 mg total) by mouth 3 (three) times daily. 08/04/16   Hinda Kehr, MD  clonazePAM (KLONOPIN) 1 MG tablet Take 1 mg by mouth 2 (two) times daily.    Historical Provider, MD  dicyclomine (BENTYL) 20 MG tablet Take 1 tablet (20 mg total) by mouth 3 (three) times daily as needed (abdominal pain). 01/11/16   Nance Pear, MD  doxycycline (VIBRA-TABS) 100 MG tablet Take 1 tablet (100 mg total) by mouth 2 (two) times daily. 05/22/16   Jenise V Bacon Menshew, PA-C  DULoxetine (CYMBALTA) 60 MG capsule Take 2 capsules (120 mg total) by mouth daily. 08/12/15   Hildred Priest, MD  fluconazole (DIFLUCAN) 150 MG tablet Take 1 tablet (150 mg total) by mouth once. Take one tablet (150 mg) by mouth once on 08/06/2016  if your rash has not improved by that date. 08/06/16 08/06/16  Hinda Kehr, MD  HYDROcodone-acetaminophen (NORCO) 5-325 MG tablet Take 1 tablet by mouth every 6 (six) hours as needed for moderate pain. 10/25/15   Duanne Guess, PA-C  hydrOXYzine (ATARAX/VISTARIL) 10 MG tablet Take 1 tablet (10 mg total) by mouth 3 (three) times daily as needed. 08/04/16   Hinda Kehr, MD  oxyCODONE-acetaminophen (PERCOCET) 10-325 MG tablet  09/20/15   Historical Provider, MD  predniSONE (DELTASONE) 20 MG tablet Take 3 tablets (60 mg total) by mouth daily. 08/04/16    Hinda Kehr, MD  QUEtiapine (SEROQUEL) 300 MG tablet Take by mouth. 03/16/15   Historical Provider, MD  rOPINIRole (REQUIP) 4 MG tablet Take 1 tablet (4 mg total) by mouth daily after supper. 08/12/15   Hildred Priest, MD    Allergies Wellbutrin [bupropion]  No family history on file.  Social History Social History  Substance Use Topics  . Smoking status: Never Smoker  . Smokeless tobacco: Never Used  . Alcohol use Yes     Comment: occasionaly    Review of Systems Constitutional: No fever/chills Eyes: No visual changes. ENT: No sore throat. Cardiovascular: Denies chest pain. Respiratory: Denies shortness of breath. Gastrointestinal: No abdominal pain.  No nausea, no vomiting.  No diarrhea.  No constipation. Genitourinary: Negative for dysuria. Musculoskeletal: Negative for back pain. Skin: Itching red rash over most of her body that is raised and bumpy.  Also has a large red area of warmth and redness that is painful below her right breast. Neurological: Negative for headaches, focal weakness or numbness.  10-point ROS otherwise negative.  ____________________________________________   PHYSICAL EXAM:  VITAL SIGNS: ED Triage Vitals  Enc Vitals Group     BP 08/04/16 0428 (!) 148/78     Pulse Rate 08/04/16 0428 84     Resp 08/04/16 0428 18     Temp 08/04/16 0428 97.9 F (36.6 C)     Temp src --      SpO2 08/04/16 0428 98 %     Weight 08/04/16 0427 205 lb (93 kg)     Height 08/04/16 0427 5\' 3"  (1.6 m)     Head Circumference --      Peak Flow --      Pain Score --      Pain Loc --      Pain Edu? --      Excl. in [place]? --     Constitutional: Alert and oriented. Well appearing and in no acute distress. Eyes: Conjunctivae are normal. PERRL. EOMI. Head: Atraumatic. Nose: No congestion/rhinnorhea. Mouth/Throat: Mucous membranes are moist. Neck: No stridor.  No meningeal signs.   Cardiovascular: Normal rate, regular rhythm. Good peripheral circulation.  Grossly normal heart sounds. Respiratory: Normal respiratory effort.  No retractions. Lungs CTAB. Gastrointestinal: Soft and nontender. No distention.  Musculoskeletal: No lower extremity tenderness nor edema. No gross deformities of extremities. Neurologic:  Normal speech and language. No gross focal neurologic deficits are appreciated.  Skin:  Skin is warm And dry.  She has extensive areas of maculopapular erythematous lesions over her arms, legs, anterior and posterior torso, and face, all of which appear most consistent with a contact dermatitis.  She has excoriated a number of areas to the point of drawing blood on some of them.  There does not appear to be any active cellulitis.  However there is a very large area below her right breast which is red and inflamed and appears more consistent  with a combination of cellulitis and probable candidal infection given what appeared to be satellite lesions at the periphery of the probable cellulitis.  The central area that I suspect a cellulitis is not raised and is mildly tender to palpation and warm to the touch.  There is no induration or fluctuance. Psychiatric: Mood and affect are normal. Speech and behavior are normal.  ____________________________________________   LABS (all labs ordered are listed, but only abnormal results are displayed)  Labs Reviewed - No data to display ____________________________________________  EKG  None - EKG not ordered by ED physician ____________________________________________  RADIOLOGY   No results found.  ____________________________________________   PROCEDURES  Critical Care performed: No   Procedure(s) performed:   Procedures   ____________________________________________   INITIAL IMPRESSION / ASSESSMENT AND PLAN / ED COURSE  Pertinent labs & imaging results that were available during my care of the patient were reviewed by me and considered in my medical decision making (see chart  for details).  The patient has normal vital signs and is afebrile.  She has no mucosal lesions, no bullae, no concerns for Stevens-Johnson's/TEN.  There is no evidence of erythema nodosum nor erythema multiforme and she has not recently started any new medications.  Initially I advised the patient to continue taking medication such as Benadryl or Atarax for the pruritus and to follow-up later today with dermatology.  However, ask her if she intends to do so and she said no because of financial reasons.  As result I will treat her empirically for a combination of possibilities: I believe she does have contact dermatitis that is so severely pruritic that she is excoriating the rash in several places.  Additionally think there is an element of superimposed cellulitis in the intertriginous region below her right breast.  Lastly, again in the intertriginous region, believed that she has a candidal infection as well.  We will treat her with a dose of fluconazole here in the emergency department as well as Keflex for cellulitis and prednisone for the generalized severe pruritic dermatitis.  I would not be comfortable prescribing the prednisone without giving her the fluconazole and Keflex as well.  I also wrote a prescription for an additional fluconazole tablet to take in 2 days if she still has evidence of infection at that time.  I encouraged her to please follow up with dermatology if at all possible.  I gave my usual and customary return precautions.        ____________________________________________  FINAL CLINICAL IMPRESSION(S) / ED DIAGNOSES  Final diagnoses:  Rash  Cellulitis of chest wall     MEDICATIONS GIVEN DURING THIS VISIT:  Medications  predniSONE (DELTASONE) tablet 60 mg (60 mg Oral Given 08/04/16 0458)  cephALEXin (KEFLEX) capsule 500 mg (500 mg Oral Given 08/04/16 0500)  fluconazole (DIFLUCAN) tablet 150 mg (150 mg Oral Given 08/04/16 0500)     NEW OUTPATIENT MEDICATIONS  STARTED DURING THIS VISIT:  New Prescriptions   CEPHALEXIN (KEFLEX) 500 MG CAPSULE    Take 1 capsule (500 mg total) by mouth 3 (three) times daily.   FLUCONAZOLE (DIFLUCAN) 150 MG TABLET    Take 1 tablet (150 mg total) by mouth once. Take one tablet (150 mg) by mouth once on 08/06/2016 if your rash has not improved by that date.   HYDROXYZINE (ATARAX/VISTARIL) 10 MG TABLET    Take 1 tablet (10 mg total) by mouth 3 (three) times daily as needed.   PREDNISONE (DELTASONE) 20 MG TABLET  Take 3 tablets (60 mg total) by mouth daily.    Modified Medications   No medications on file    Discontinued Medications   No medications on file     Note:  This document was prepared using Dragon voice recognition software and may include unintentional dictation errors.    Hinda Kehr, MD 08/04/16 [phone]

## 2016-08-04 NOTE — ED Notes (Addendum)
Pt reports "itchy rash all over me" since Sunday. Pt reports Saturday she worked in the yard and Sunday noticed raised bumps to arms, face, abd and legs. Pt reports "my face itches and arms however the area under my right breast does not itch as much as it is a bit painful." Large red area under right breast. Swelling and redness noted to pt's left orbital area. Pt states "in the morning my left eye is almost swollen shut, I put cold compress on it that helps." Red, raised areas noted to back of right knee, bilateral arms and anterior portion of legs, lower back, central abd and face. Pt denies difficulty breathing or tongue swelling. Pt with equal, unlabored resp. Pt states hx of shingles. Denies fever or new admin medication or antibiotics.

## 2016-09-04 ENCOUNTER — Emergency Department: Payer: Self-pay

## 2016-09-04 ENCOUNTER — Emergency Department
Admission: EM | Admit: 2016-09-04 | Discharge: 2016-09-05 | Disposition: A | Discharge status: Home / Self Care | Payer: Medicaid Other | Attending: Emergency Medicine | Admitting: Emergency Medicine

## 2016-09-04 ENCOUNTER — Inpatient Hospital Stay: Admission: AD | Admit: 2016-09-04 | Payer: Medicaid Other | Source: Ambulatory Visit | Admitting: Psychiatry

## 2016-09-04 DIAGNOSIS — Z79899 Other long term (current) drug therapy: Secondary | ICD-10-CM | POA: Insufficient documentation

## 2016-09-04 DIAGNOSIS — F603 Borderline personality disorder: Secondary | ICD-10-CM | POA: Diagnosis present

## 2016-09-04 DIAGNOSIS — F339 Major depressive disorder, recurrent, unspecified: Secondary | ICD-10-CM | POA: Diagnosis present

## 2016-09-04 DIAGNOSIS — R1111 Vomiting without nausea: Secondary | ICD-10-CM

## 2016-09-04 DIAGNOSIS — R1084 Generalized abdominal pain: Secondary | ICD-10-CM | POA: Insufficient documentation

## 2016-09-04 DIAGNOSIS — F329 Major depressive disorder, single episode, unspecified: Secondary | ICD-10-CM | POA: Insufficient documentation

## 2016-09-04 DIAGNOSIS — M797 Fibromyalgia: Secondary | ICD-10-CM | POA: Diagnosis present

## 2016-09-04 DIAGNOSIS — R531 Weakness: Secondary | ICD-10-CM

## 2016-09-04 LAB — URINALYSIS, COMPLETE (UACMP) WITH MICROSCOPIC
BACTERIA UA: NONE SEEN
BILIRUBIN URINE: NEGATIVE
Glucose, UA: NEGATIVE mg/dL
HGB URINE DIPSTICK: NEGATIVE
Ketones, ur: NEGATIVE mg/dL
Leukocytes, UA: NEGATIVE
NITRITE: NEGATIVE
PROTEIN: NEGATIVE mg/dL
Specific Gravity, Urine: 1.002 — ABNORMAL LOW (ref 1.005–1.030)
pH: 6 (ref 5.0–8.0)

## 2016-09-04 LAB — SALICYLATE LEVEL: Salicylate Lvl: <7 mg/dL (ref 2.8–30.0)

## 2016-09-04 LAB — LACTIC ACID, PLASMA
LACTIC ACID, VENOUS: 2.4 mmol/L — AB (ref 0.5–1.9)
Lactic Acid, Venous: 0.7 mmol/L (ref 0.5–1.9)

## 2016-09-04 LAB — COMPREHENSIVE METABOLIC PANEL
ALT: 15 U/L (ref 14–54)
ANION GAP: 9 (ref 5–15)
AST: 32 U/L (ref 15–41)
Albumin: 3.9 g/dL (ref 3.5–5.0)
Alkaline Phosphatase: 89 U/L (ref 38–126)
BUN: 8 mg/dL (ref 6–20)
CHLORIDE: 104 mmol/L (ref 101–111)
CO2: 26 mmol/L (ref 22–32)
Calcium: 8.8 mg/dL — ABNORMAL LOW (ref 8.9–10.3)
Creatinine, Ser: 0.81 mg/dL (ref 0.44–1.00)
GFR calc non Af Amer: >60 mL/min (ref >60)
Glucose, Bld: 144 mg/dL — ABNORMAL HIGH (ref 65–99)
Potassium: 3.2 mmol/L — ABNORMAL LOW (ref 3.5–5.1)
SODIUM: 139 mmol/L (ref 135–145)
Total Bilirubin: 1.1 mg/dL (ref 0.3–1.2)
Total Protein: 7.1 g/dL (ref 6.5–8.1)

## 2016-09-04 LAB — TSH: TSH: 3.065 u[IU]/mL (ref 0.350–4.500)

## 2016-09-04 LAB — CBC WITH DIFFERENTIAL/PLATELET
Basophils Absolute: 0 10*3/uL (ref 0–0.1)
Basophils Relative: 0 %
EOS ABS: 0 10*3/uL (ref 0–0.7)
Eosinophils Relative: 1 %
HCT: 40.8 % (ref 35.0–47.0)
Hemoglobin: 13.5 g/dL (ref 12.0–16.0)
LYMPHS ABS: 1.8 10*3/uL (ref 1.0–3.6)
Lymphocytes Relative: 24 %
MCH: 28.8 pg (ref 26.0–34.0)
MCHC: 33.2 g/dL (ref 32.0–36.0)
MCV: 86.9 fL (ref 80.0–100.0)
MONOS PCT: 8 %
Monocytes Absolute: 0.6 10*3/uL (ref 0.2–0.9)
Neutro Abs: 4.9 10*3/uL (ref 1.4–6.5)
Neutrophils Relative %: 67 %
PLATELETS: 340 10*3/uL (ref 150–440)
RBC: 4.69 MIL/uL (ref 3.80–5.20)
RDW: 14.3 % (ref 11.5–14.5)
WBC: 7.3 10*3/uL (ref 3.6–11.0)

## 2016-09-04 LAB — ETHANOL: Alcohol, Ethyl (B): <5 mg/dL (ref <5)

## 2016-09-04 LAB — INFLUENZA PANEL BY PCR (TYPE A & B)
INFLAPCR: NEGATIVE
Influenza B By PCR: NEGATIVE

## 2016-09-04 LAB — ACETAMINOPHEN LEVEL: Acetaminophen (Tylenol), Serum: <10 ug/mL — ABNORMAL LOW (ref 10–30)

## 2016-09-04 LAB — TROPONIN I: Troponin I: <0.03 ng/mL (ref <0.03)

## 2016-09-04 LAB — LIPASE, BLOOD: LIPASE: 15 U/L (ref 11–51)

## 2016-09-04 IMAGING — CT CT ABD-PELV W/ CM
2 of 5 series · 16 of 46 positions shown, 18 images · IV contrast (iopamidol)
Comparison: None.

CLINICAL DATA: Diffuse abdominal pain, nausea and vomiting for the
past 3 days.

EXAM:
CT ABDOMEN AND PELVIS WITH CONTRAST
TECHNIQUE: Multidetector CT imaging of the abdomen and pelvis was performed
using the standard protocol following bolus administration of
intravenous contrast.
CONTRAST:  100mL [JF] IOPAMIDOL ([JF]) INJECTION 61%,
30mL [JF] IOPAMIDOL ([JF]) INJECTION 61%

[Series 2: routine abd/pel with · axial · 0.84mm/px · z∈[-938,-498]mm · 13 of 100 slices shown, 15 images]
[im 6/100  soft-tissue]
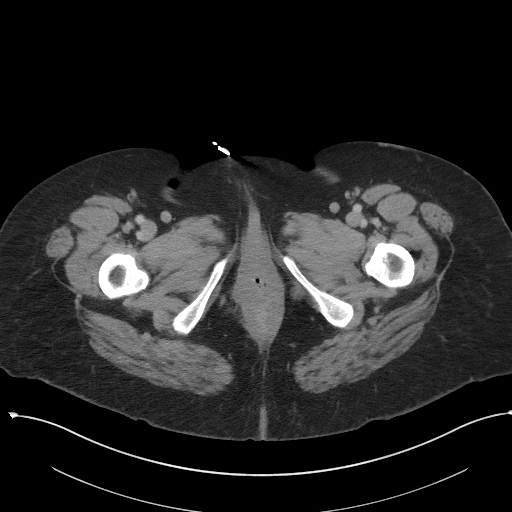
[im 6/100  bone]
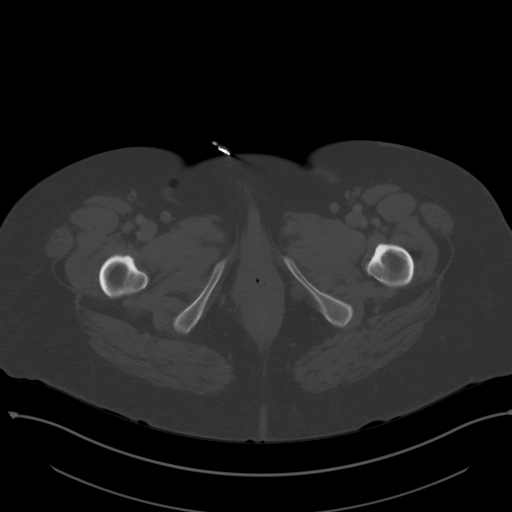
[im 12/100  soft-tissue]
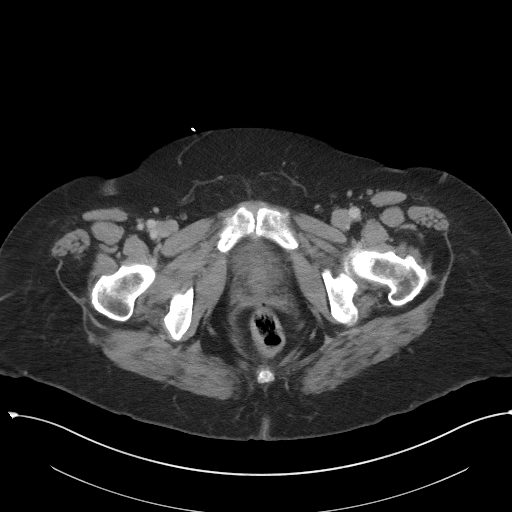
[im 23/100  soft-tissue]
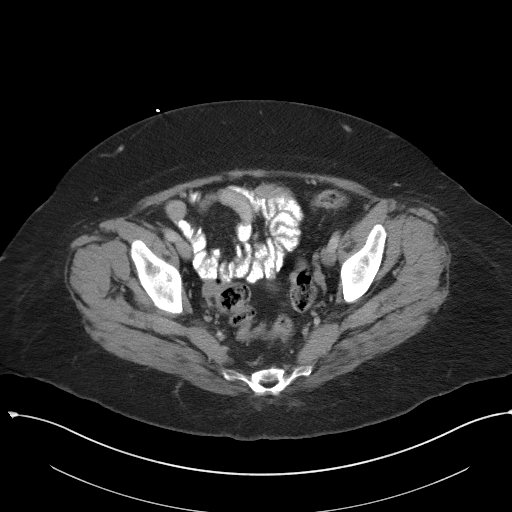
[im 28/100  soft-tissue]
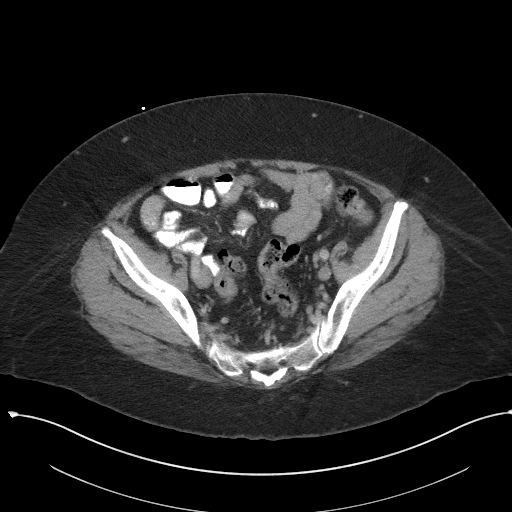
[im 34/100  soft-tissue]
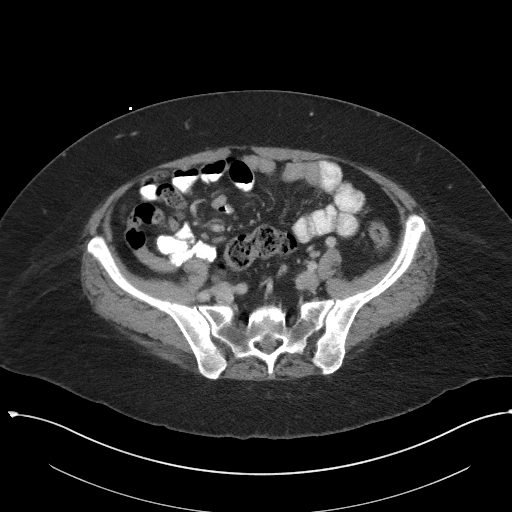
[im 45/100  soft-tissue]
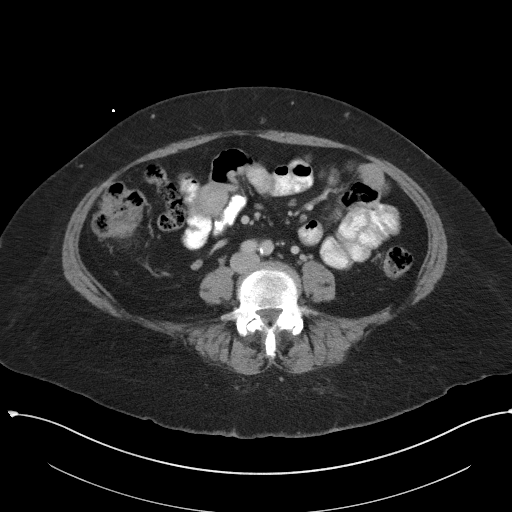
[im 50/100  soft-tissue]
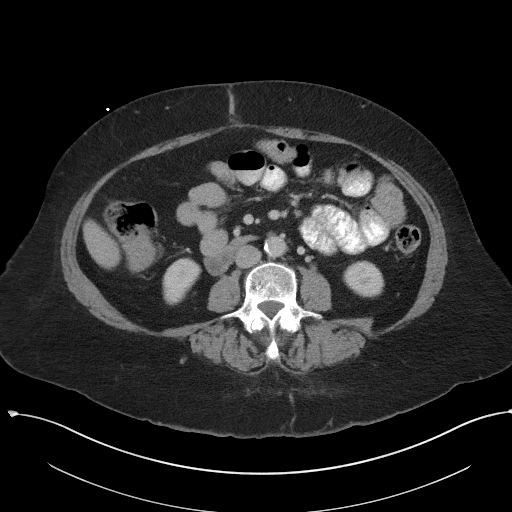
[im 56/100  soft-tissue]
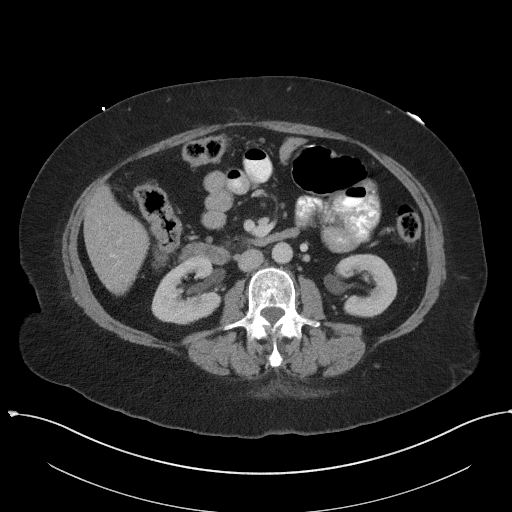
[im 67/100  soft-tissue]
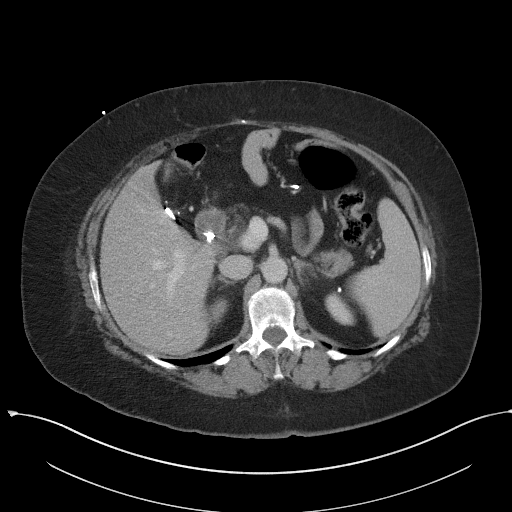
[im 67/100  bone]
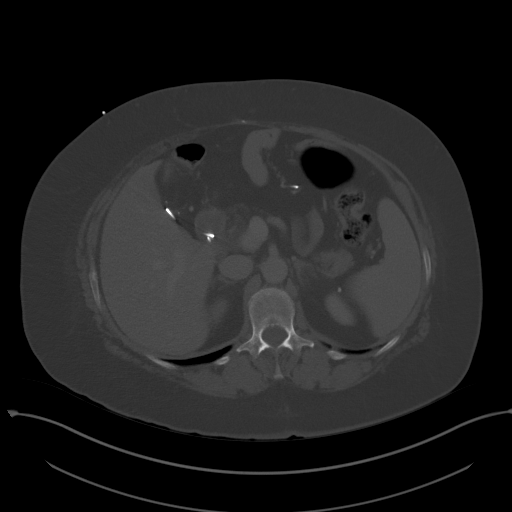
[im 72/100  soft-tissue]
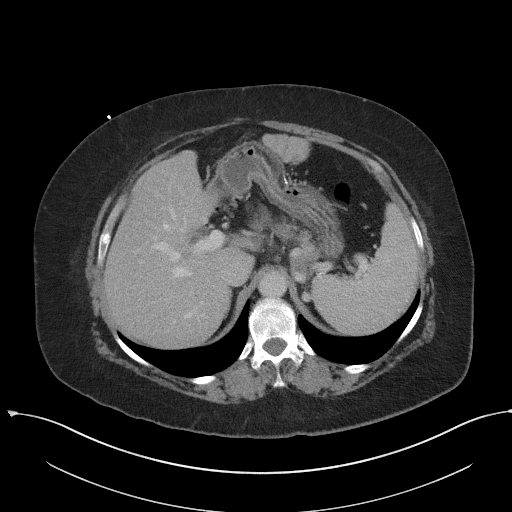
[im 78/100  soft-tissue]
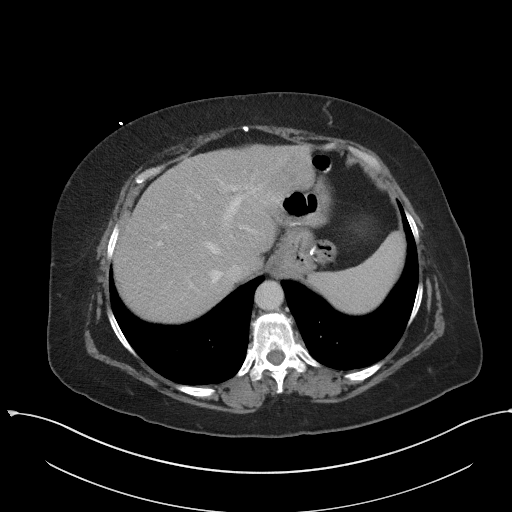
[im 89/100  soft-tissue]
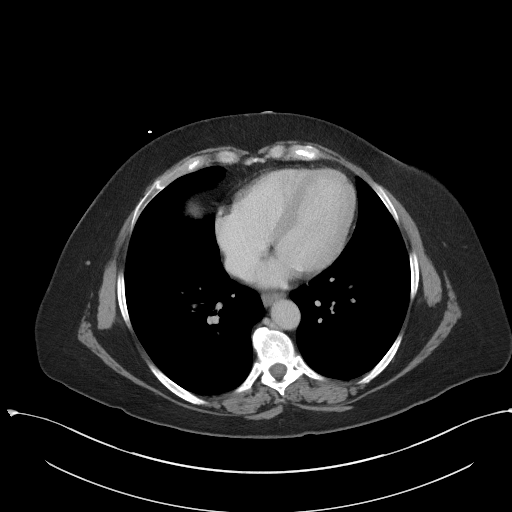
[im 94/100  soft-tissue]
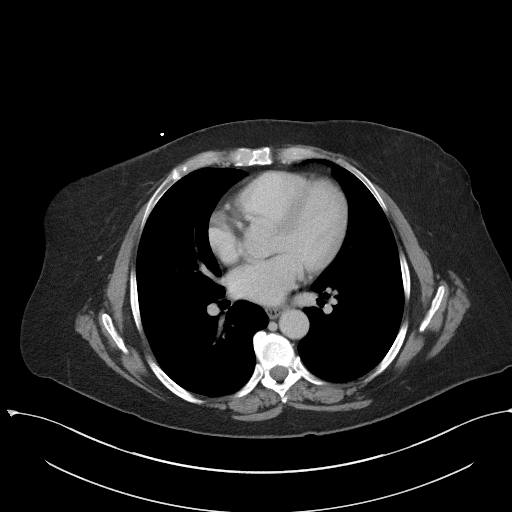

[Series 5: coronal st · coronal · 0.81mm/px · 3 of 101 slices shown]
[im 34/101  soft-tissue]
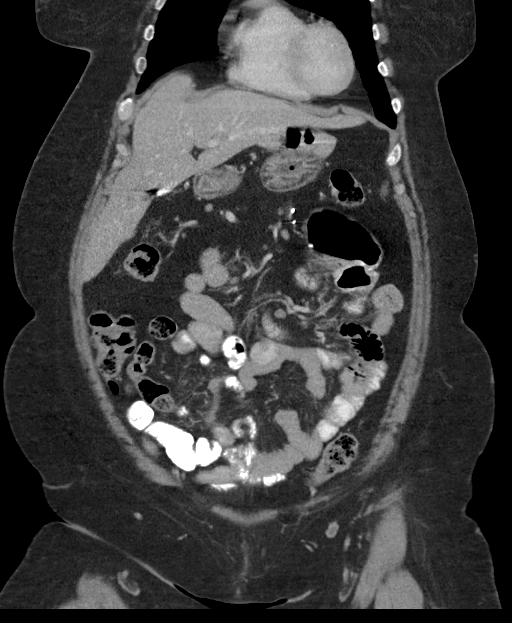
[im 45/101  soft-tissue]
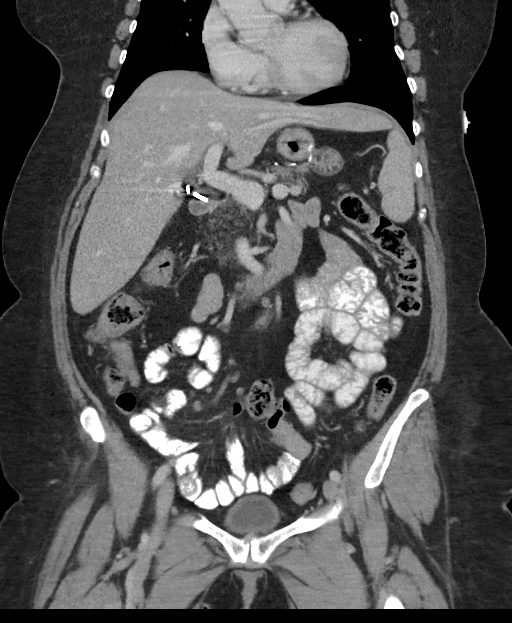
[im 56/101  soft-tissue]
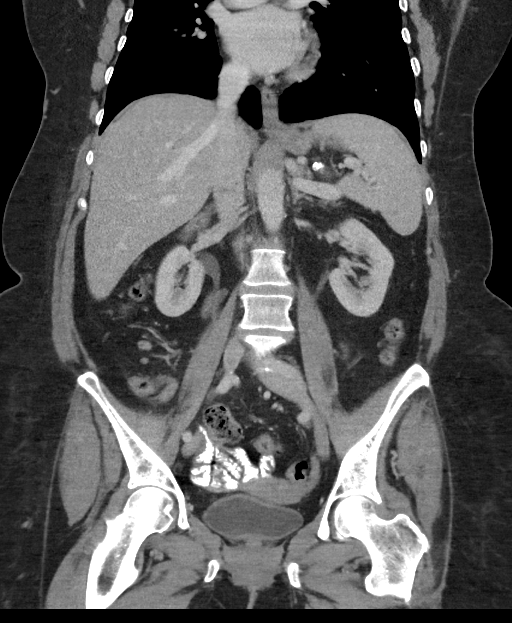

[16 of 46 positions shown; findings below may reference images not displayed]

FINDINGS: Lower chest: Unremarkable.

Hepatobiliary: Mild diffuse low density of the liver relative to the
spleen. Cholecystectomy clips.

Pancreas: Diffuse pancreatic atrophy with partial fatty replacement.

Spleen: Normal in size without focal abnormality.

Adrenals/Urinary Tract: Adrenal glands are unremarkable. Kidneys are
normal, without renal calculi, focal lesion, or hydronephrosis.
Bladder is unremarkable.

Stomach/Bowel: Post gastric bypass changes. Unremarkable small bowel
and colon. No evidence of appendicitis.

Vascular/Lymphatic: Mildly prominent right lower quadrant mesenteric
lymph nodes. The largest has a short axis diameter of 9 mm on image
number 58 of series 2. Mild atheromatous arterial calcifications
without aneurysm.

Reproductive: Uterus and bilateral adnexa are unremarkable.

Other: No abdominal wall hernia or abnormality. No abdominopelvic
ascites.

Musculoskeletal: Lower thoracic and lower lumbar spine degenerative
changes. L2 vertebral hemangioma.
IMPRESSION: 1. No acute abnormality.
2. Borderline right lower quadrant mesenteric adenopathy, most
likely reactive.
3. Mild diffuse hepatic steatosis.
4. Diffuse pancreatic atrophy with partial fatty replacement.

## 2016-09-04 MED ORDER — HYDROXYZINE HCL 10 MG PO TABS
10.0000 mg | ORAL_TABLET | Freq: Three times a day (TID) | ORAL | Status: DC | PRN
  Filled 2016-09-04: qty 1

## 2016-09-04 MED ORDER — ROPINIROLE HCL 1 MG PO TABS
4.0000 mg | ORAL_TABLET | Freq: Every day | ORAL | Status: DC
  Administered 2016-09-04: 4 mg via ORAL
  Filled 2016-09-04: qty 4

## 2016-09-04 MED ORDER — QUETIAPINE FUMARATE 300 MG PO TABS
300.0000 mg | ORAL_TABLET | Freq: Every day | ORAL | Status: DC
  Filled 2016-09-04: qty 1

## 2016-09-04 MED ORDER — CLONAZEPAM 1 MG PO TABS
1.0000 mg | ORAL_TABLET | Freq: Two times a day (BID) | ORAL | Status: DC
  Administered 2016-09-04 – 2016-09-05 (×2): 1 mg via ORAL
  Filled 2016-09-04 (×2): qty 1

## 2016-09-04 MED ORDER — SODIUM CHLORIDE 0.9 % IV SOLN
Freq: Once | INTRAVENOUS | Status: AC
  Administered 2016-09-04: 11:00:00 via INTRAVENOUS

## 2016-09-04 MED ORDER — BENZONATATE 100 MG PO CAPS
100.0000 mg | ORAL_CAPSULE | Freq: Three times a day (TID) | ORAL | Status: DC | PRN
  Filled 2016-09-04: qty 1

## 2016-09-04 MED ORDER — IOPAMIDOL (ISOVUE-300) INJECTION 61%
30.0000 mL | Freq: Once | INTRAVENOUS | Status: AC | PRN
  Administered 2016-09-04: 30 mL via ORAL

## 2016-09-04 MED ORDER — IOPAMIDOL (ISOVUE-300) INJECTION 61%
100.0000 mL | Freq: Once | INTRAVENOUS | Status: AC | PRN
  Administered 2016-09-04: 100 mL via INTRAVENOUS

## 2016-09-04 MED ORDER — DICYCLOMINE HCL 10 MG PO CAPS: 20.0000 mg | ORAL_CAPSULE | Freq: Three times a day (TID) | ORAL | Status: DC | PRN

## 2016-09-04 MED ORDER — CLONAZEPAM 0.5 MG PO TBDP
1.0000 mg | ORAL_TABLET | Freq: Two times a day (BID) | ORAL | Status: DC
  Administered 2016-09-04: 1 mg via ORAL
  Filled 2016-09-04: qty 2

## 2016-09-04 MED ORDER — ALBUTEROL SULFATE (2.5 MG/3ML) 0.083% IN NEBU
3.0000 mL | INHALATION_SOLUTION | Freq: Four times a day (QID) | RESPIRATORY_TRACT | Status: DC | PRN
  Filled 2016-09-04: qty 3

## 2016-09-04 NOTE — ED Notes (Signed)

## 2016-09-04 NOTE — ED Notes (Signed)
Pt. Alert and oriented, warm and dry, in no distress. Pt. Denies SI, HI, and AVH. Pt states she has been really depressed and stressed because she is only one working currently because husband is in rehab. Pt. Encouraged to let nursing staff know of any concerns or needs.

## 2016-09-04 NOTE — BH Assessment (Signed)
Patient was seen by  [facility] and recommended Psychiatric Hospitalization. Patient is to be admitted to [facility] by Dr. Alonna Minium.  Attending Physician will be Dr. Bary Leriche.   Patient has been assigned to room 323, by [place].   Intake Paper Work has been signed and placed on patient chart.  ER staff is aware of the admission (Dr. Cinda Quest, ER MD; Erasmo Downer, Patient's Nurse & Modena Nunnery, Patient Access).   Patient admission was placed on hold due to staff calling out on the [facility] BMU. Patient will be referred to other faiclities.

## 2016-09-04 NOTE — ED Notes (Signed)
ED BHU [place] Is the patient under IVC or is there intent for IVC: No. Is the patient medically cleared: Yes.   Is there vacancy in the ED BHU: Yes.   Is the population mix appropriate for patient: Yes.   Is the patient awaiting placement in inpatient or outpatient setting: Yes.   Has the patient had a psychiatric consult: Yes.   Survey of unit performed for contraband, proper placement and condition of furniture, tampering with fixtures in bathroom, shower, and each patient room: Yes.  ; Findings: NA APPEARANCE/BEHAVIOR calm, cooperative and adequate rapport can be established NEURO ASSESSMENT Orientation: time, place and person Hallucinations: No.None noted (Hallucinations) Speech: Normal Gait: normal RESPIRATORY ASSESSMENT Normal expansion.  Clear to auscultation.  No rales, rhonchi, or wheezing. CARDIOVASCULAR ASSESSMENT regular rate and rhythm, S1, S2 normal, no murmur, click, rub or gallop GASTROINTESTINAL ASSESSMENT soft, nontender, BS WNL, no r/g EXTREMITIES normal strength, tone, and muscle mass PLAN OF CARE Provide calm/safe environment. Vital signs assessed twice daily. ED BHU Assessment once each 12-hour shift. Collaborate with intake RN daily or as condition indicates. Assure the ED provider has rounded once each shift. Provide and encourage hygiene. Provide redirection as needed. Assess for escalating behavior; address immediately and inform ED provider.  Assess family dynamic and appropriateness for visitation as needed: Yes.  ; If necessary, describe findings: NA Educate the patient/family about BHU procedures/visitation: Yes.  ; If necessary, describe findings: NA

## 2016-09-04 NOTE — ED Notes (Addendum)
2 patient belonging bags containing pts clothing and purse, cell phone. No dollars in wallet, change only. Offered to lock up items in safe, pt denies, reports "it is just change."

## 2016-09-04 NOTE — ED Notes (Signed)
FIRST NURSE NOTE: C/o weakness and nausea. States "I think I have the flu"  Pt placed in wheelchair.

## 2016-09-04 NOTE — BH Assessment (Signed)
Referral information for Psychiatric Hospitalization faxed to;    [facility] [phone]),   Rosana Hoes [phone]),    Mikel Cella [phone]),    [address] [phone]),    [facility] [phone])   [facility] [phone]),    [place] [phone] or [phone]),    [facility] [phone]),    Mayer Camel [phone]).   [employer]

## 2016-09-04 NOTE — ED Notes (Addendum)
Pt transferred from ED to [facility] bed 1. Pt wanded and oriented to unit. Pt reports increasing depression. Pt states," I haven't been taking care of myself.".. " dishes have been piling up and I haven't been eating." Pt denies SI/HI and A/V hallucinations.Patient given ginger ale per pt's request. Patient remains safe with 15 minute checks.

## 2016-09-04 NOTE — ED Notes (Signed)
Pt dressed out in burgandy scrubs.

## 2016-09-04 NOTE — ED Notes (Signed)
Report given to RN for room 323
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
## 2016-09-04 NOTE — ED Triage Notes (Signed)
Pt reports N/V x3 days with increasing weakness and fatigued.
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
## 2016-09-04 NOTE — ED Notes (Signed)
Snack given.

## 2016-09-04 NOTE — ED Notes (Signed)
Patient brought dinner 

## 2016-09-05 DIAGNOSIS — F331 Major depressive disorder, recurrent, moderate: Secondary | ICD-10-CM

## 2016-09-05 MED ORDER — OXYCODONE-ACETAMINOPHEN 5-325 MG PO TABS
1.0000 | ORAL_TABLET | Freq: Four times a day (QID) | ORAL | Status: DC | PRN
  Administered 2016-09-05: 1 via ORAL
  Filled 2016-09-05: qty 1

## 2016-09-05 MED ORDER — BUPROPION HCL ER (XL) 150 MG PO TB24: 150.0000 mg | ORAL_TABLET | Freq: Every day | ORAL | 0 refills | Status: DC

## 2016-09-05 MED ORDER — ONDANSETRON 4 MG PO TBDP
4.0000 mg | ORAL_TABLET | Freq: Once | ORAL | Status: AC
  Administered 2016-09-05: 4 mg via ORAL
  Filled 2016-09-05: qty 1

## 2016-09-05 NOTE — ED Notes (Signed)
Pt in bathroom actively vomiting.

## 2016-09-05 NOTE — ED Notes (Signed)
Pt discharged to sister in lobbyPt was stable and appreciative at that time. All papers and prescriptions were given and valuables returned. Verbal understanding expressed. Denies SI/HI and A/VH. Pt given opportunity to express concerns and ask questions.

## 2016-09-05 NOTE — ED Notes (Signed)
Pt awake and alert-easily aroused from sleep for breakfast and meds. Pt states that she had 'dry heaves' in the night but did not throw up. Pt still c/o slight nausea

## 2016-09-05 NOTE — ED Notes (Addendum)
Patient c/o nerve pain from fibromyalgia- rates her pain a 7/10. Pt reports that she has been taking percocet every day for over 10 years.

## 2016-09-05 NOTE — ED Provider Notes (Signed)
-----------------------------------------   6:43 AM on 09/05/2016 -----------------------------------------   Blood pressure (!) 152/98, pulse 87, temperature 98.1 F (36.7 C), temperature source Oral, resp. rate 16, height 5\' 3"  (1.6 m), weight 205 lb (93 kg), SpO2 96 %.  Informed overnight of patient's vomiting in the restroom. She was given ODT Zofran with good relief of symptoms. In reviewing the chart, I see that patient had a presenting chief complaint of nausea. Had CT abdomen/pelvis which was unremarkable. Patient had no further vomiting after Zofran administration and is currently sleeping in no acute distress.  Disposition is pending Psychiatry/Behavioral Medicine team recommendations.     Paulette Blanch, MD 09/05/16 [phone]

## 2016-09-05 NOTE — ED Notes (Signed)
Lunch brought to patient 

## 2016-09-05 NOTE — ED Notes (Signed)
Pt requesting shower. Pt given new scrubs, socks, underwear, toiletries and towels and is taking a shower with security sitting outside of shower door in dayroom

## 2016-09-05 NOTE — Consult Note (Signed)
[facility] Face-to-Face Psychiatry Consult   Reason for Consult:  Consult for 61 year old woman with a history of depression who came into the hospital complaining of depressed mood and poor functioning Referring Physician:  Corky Downs Patient Identification: Ann Bowen MRN:  [id number] Principal Diagnosis: MDD (major depressive disorder), recurrent episode ([place]) Diagnosis:   Patient Active Problem List   Diagnosis Date Noted  . Borderline personality disorder [F60.3] 08/10/2015  . MDD (major depressive disorder), recurrent episode ([place]) [F33.9] 08/10/2015  . Fibromyalgia [M79.7] 08/10/2015  . Osteoarthritis [M19.90] 08/10/2015  . Tobacco use disorder [F17.200] 08/10/2015    Total Time spent with patient: 1 hour  Subjective:   Ann Bowen is a 61 y.o. female patient admitted with "I had a dark depression" patient reports that she has been stressed out recently because she is staying at her house alone. Her husband is in a long-term substance abuse treatment program and so [place]. Her mood has been down anxious and depressed. She says her energy level has been poor and she lacks motivation to get things done. Sleeps poorly at night. Doesn't eat well. She says she has not been taking care of herself well although she says she has been taking her prescription medicine. Patient reported that her current medicines are when necessary Klonopin when necessary oxycodone when necessary Seroquel and Requip and Cymbalta. Drinking alcohol and denies that she's been abusing other drugs. Patient denies having any suicidal or homicidal thoughts. Denies any hallucinations. She says she does have an outpatient appointment in mid May. No evidence currently of psychosis or dangerousness.Marland Kitchen  HPI:  See information above about current history of present illness. Patient reports depressed mood fatigue and lack of motivation but no suicidal homicidal ideation and no psychosis.  Social  history: Living by herself. Has a part-time job at [employer]. She is married but her husband is not at home currently and won't be back for probably another month. She has a sister that she stays in close contact with.  Medical history: History of fibromyalgia for which she is apparently prescribed chronic narcotic pain medicine.  Substance abuse history: Patient denies any alcohol or drug abuse. Doesn't have insight into how her use of sedatives is probably excessive and making her feel more run down and tired and affecting her judgment.  Past Psychiatric History: Patient has had a previous admission to the hospital here. She denies any actual attempts at suicide denies any history of violence. She is currently taking Cymbalta and said she has had several medicines in the past. She says her primary psychiatrist had wanted to start her on Wellbutrin recently.  Risk to Self: Suicidal Ideation: No-Not Currently/Within Last 6 Months Suicidal Intent: No-Not Currently/Within Last 6 Months Is patient at risk for suicide?: No Suicidal Plan?: No-Not Currently/Within Last 6 Months Access to Means: No What has been your use of drugs/alcohol within the last 12 months?: Reports of none  How many times?: 3 Other Self Harm Risks: Reports of none Triggers for Past Attempts: Spouse contact, Family contact Intentional Self Injurious Behavior: None Risk to Others: Homicidal Ideation: No Thoughts of Harm to Others: No Current Homicidal Intent: No Current Homicidal Plan: No Access to Homicidal Means: No Identified Victim: Reports of none History of harm to others?: No Assessment of Violence: None Noted Violent Behavior Description: Reports of none Does patient have access to weapons?: No Criminal Charges Pending?: No Does patient have a court date: No Prior Inpatient Therapy: Prior  Inpatient Therapy: Yes Prior Therapy Dates: 07/2015, 07/2014 & 06/2014 Prior Therapy Facilty/Provider(s): [facility] Reason for Treatment: Depression Prior Outpatient Therapy: Prior Outpatient Therapy: Yes Prior Therapy Dates: Currently Prior Therapy Facilty/Provider(s): [facility] Outpatient Psychiatry Reason for Treatment: Depression Does patient have an ACCT team?: No Does patient have Intensive In-House Services?  : No Does patient have [facility] services? : No Does patient have P4CC services?: No  Past Medical History:  Past Medical History:  Diagnosis Date  . Arthritis   . Borderline personality disorder   . Depression   . Fibromyalgia   . Shingles     Past Surgical History:  Procedure Laterality Date  . CHOLECYSTECTOMY    . GASTRIC BYPASS    . SHOULDER SURGERY     Family History: History reviewed. No pertinent family history. Family Psychiatric  History: Lots of people in the family with depression no close relatives any suicidal behavior Social History:  History  Alcohol Use  . Yes    Comment: occasionaly     History  Drug Use No    Social History   Social History  . Marital status: Married    Spouse name: N/A  . Number of children: N/A  . Years of education: N/A   Social History Main Topics  . Smoking status: Never Smoker  . Smokeless tobacco: Never Used  . Alcohol use Yes     Comment: occasionaly  . Drug use: No  . Sexual activity: Not Asked   Other Topics Concern  . None   Social History Narrative   ** Merged History Encounter **       Additional Social History:    Allergies:   Allergies  Allergen Reactions  . Wellbutrin [Bupropion] Hives and Rash    Labs:  Results for orders placed or performed during the hospital encounter of 09/04/16 (from the past 48 hour(s))  Urinalysis, Complete w Microscopic     Status: Abnormal   Collection Time: 09/04/16  8:08 AM  Result Value Ref Range   Color, Urine STRAW (A) YELLOW   APPearance CLEAR (A) CLEAR   Specific Gravity, Urine 1.002 (L) 1.005 - 1.030   pH 6.0 5.0 - 8.0   Glucose, UA NEGATIVE NEGATIVE mg/dL    Hgb urine dipstick NEGATIVE NEGATIVE   Bilirubin Urine NEGATIVE NEGATIVE   Ketones, ur NEGATIVE NEGATIVE mg/dL   Protein, ur NEGATIVE NEGATIVE mg/dL   Nitrite NEGATIVE NEGATIVE   Leukocytes, UA NEGATIVE NEGATIVE   RBC / HPF 0-5 0 - 5 RBC/hpf   WBC, UA 0-5 0 - 5 WBC/hpf   Bacteria, UA NONE SEEN NONE SEEN   Squamous Epithelial / LPF 0-5 (A) NONE SEEN  Lipase, blood     Status: None   Collection Time: 09/04/16  8:14 AM  Result Value Ref Range   Lipase 15 11 - 51 U/L  Comprehensive metabolic panel     Status: Abnormal   Collection Time: 09/04/16  8:14 AM  Result Value Ref Range   Sodium 139 135 - 145 mmol/L   Potassium 3.2 (L) 3.5 - 5.1 mmol/L   Chloride 104 101 - 111 mmol/L   CO2 26 22 - 32 mmol/L   Glucose, Bld 144 (H) 65 - 99 mg/dL   BUN 8 6 - 20 mg/dL   Creatinine, Ser 0.81 0.44 - 1.00 mg/dL   Calcium 8.8 (L) 8.9 - 10.3 mg/dL   Total Protein 7.1 6.5 - 8.1 g/dL   Albumin 3.9 3.5 - 5.0 g/dL  AST 32 15 - 41 U/L   ALT 15 14 - 54 U/L   Alkaline Phosphatase 89 38 - 126 U/L   Total Bilirubin 1.1 0.3 - 1.2 mg/dL   GFR calc non Af Amer >60 >60 mL/min   GFR calc Af Amer >60 >60 mL/min    Comment: (NOTE) The eGFR has been calculated using the CKD EPI equation. This calculation has not been validated in all clinical situations. eGFR's persistently <60 mL/min signify possible Chronic Kidney Disease.    Anion gap 9 5 - 15  Lactic acid, plasma     Status: Abnormal   Collection Time: 09/04/16  8:14 AM  Result Value Ref Range   Lactic Acid, Venous 2.4 (HH) 0.5 - 1.9 mmol/L    Comment: CRITICAL RESULT CALLED TO, READ BACK BY AND VERIFIED WITH FELICIA STAROPOLI @ 3785 09/04/16 BY [facility]   Troponin I     Status: None   Collection Time: 09/04/16  8:14 AM  Result Value Ref Range   Troponin I <0.03 <0.03 ng/mL  CBC with Differential     Status: None   Collection Time: 09/04/16  8:14 AM  Result Value Ref Range   WBC 7.3 3.6 - 11.0 K/uL   RBC 4.69 3.80 - 5.20 MIL/uL   Hemoglobin 13.5 12.0  - 16.0 g/dL   HCT 40.8 35.0 - 47.0 %   MCV 86.9 80.0 - 100.0 fL   MCH 28.8 26.0 - 34.0 pg   MCHC 33.2 32.0 - 36.0 g/dL   RDW 14.3 11.5 - 14.5 %   Platelets 340 150 - 440 K/uL   Neutrophils Relative % 67 %   Neutro Abs 4.9 1.4 - 6.5 K/uL   Lymphocytes Relative 24 %   Lymphs Abs 1.8 1.0 - 3.6 K/uL   Monocytes Relative 8 %   Monocytes Absolute 0.6 0.2 - 0.9 K/uL   Eosinophils Relative 1 %   Eosinophils Absolute 0.0 0 - 0.7 K/uL   Basophils Relative 0 %   Basophils Absolute 0.0 0 - 0.1 K/uL  Influenza panel by PCR (type A & B)     Status: None   Collection Time: 09/04/16  8:14 AM  Result Value Ref Range   Influenza A By PCR NEGATIVE NEGATIVE   Influenza B By PCR NEGATIVE NEGATIVE    Comment: (NOTE) The Xpert Xpress Flu assay is intended as an aid in the diagnosis of  influenza and should not be used as a sole basis for treatment.  This  assay is FDA approved for nasopharyngeal swab specimens only. Nasal  washings and aspirates are unacceptable for Xpert Xpress Flu testing.   TSH     Status: None   Collection Time: 09/04/16  8:14 AM  Result Value Ref Range   TSH 3.065 0.350 - 4.500 uIU/mL    Comment: Performed by a 3rd Generation assay with a functional sensitivity of <=0.01 uIU/mL.  Lactic acid, plasma     Status: None   Collection Time: 09/04/16 10:33 AM  Result Value Ref Range   Lactic Acid, Venous 0.7 0.5 - 1.9 mmol/L  Acetaminophen level     Status: Abnormal   Collection Time: 09/04/16 10:33 AM  Result Value Ref Range   Acetaminophen (Tylenol), Serum <10 (L) 10 - 30 ug/mL    Comment:        THERAPEUTIC CONCENTRATIONS VARY SIGNIFICANTLY. A RANGE OF 10-30 ug/mL MAY BE AN EFFECTIVE CONCENTRATION FOR MANY PATIENTS. HOWEVER, SOME ARE BEST TREATED AT CONCENTRATIONS OUTSIDE THIS RANGE.  ACETAMINOPHEN CONCENTRATIONS >150 ug/mL AT 4 HOURS AFTER INGESTION AND >50 ug/mL AT 12 HOURS AFTER INGESTION ARE OFTEN ASSOCIATED WITH TOXIC REACTIONS.   Ethanol     Status: None    Collection Time: 09/04/16 10:33 AM  Result Value Ref Range   Alcohol, Ethyl (B) <5 <5 mg/dL    Comment:        LOWEST DETECTABLE LIMIT FOR SERUM ALCOHOL IS 5 mg/dL FOR MEDICAL PURPOSES ONLY   Salicylate level     Status: None   Collection Time: 09/04/16 10:33 AM  Result Value Ref Range   Salicylate Lvl <5.4 2.8 - 30.0 mg/dL    Current Facility-Administered Medications  Medication Dose Route Frequency Provider Last Rate Last Dose  . albuterol (PROVENTIL) (2.5 MG/3ML) 0.083% nebulizer solution 3 mL  3 mL Inhalation Q6H PRN Carrie Mew, MD      . benzonatate (TESSALON) capsule 100 mg  100 mg Oral TID PRN Carrie Mew, MD      . clonazePAM Bobbye [place]) tablet 1 mg  1 mg Oral BID Carrie Mew, MD   1 mg at 09/05/16 0903  . dicyclomine (BENTYL) capsule 20 mg  20 mg Oral TID PRN Carrie Mew, MD      . hydrOXYzine (ATARAX/VISTARIL) tablet 10 mg  10 mg Oral TID PRN Carrie Mew, MD      . oxyCODONE-acetaminophen (PERCOCET/ROXICET) 5-325 MG per tablet 1 tablet  1 tablet Oral Q6H PRN Lavonia Drafts, MD   1 tablet at 09/05/16 1111  . QUEtiapine (SEROQUEL) tablet 300 mg  300 mg Oral QHS Carrie Mew, MD      . rOPINIRole (REQUIP) tablet 4 mg  4 mg Oral q1800 Carrie Mew, MD   4 mg at 09/04/16 2141   Current Outpatient Prescriptions  Medication Sig Dispense Refill  . clonazePAM (KLONOPIN) 1 MG tablet Take 1 mg by mouth 2 (two) times daily.    . DULoxetine (CYMBALTA) 60 MG capsule Take 2 capsules (120 mg total) by mouth daily. (Patient taking differently: Take 60 mg by mouth daily. )  3  . oxyCODONE-acetaminophen (PERCOCET) 10-325 MG tablet Take 1 tablet by mouth every 4 (four) hours as needed.     Marland Kitchen QUEtiapine (SEROQUEL) 300 MG tablet Take by mouth.    Marland Kitchen rOPINIRole (REQUIP) 4 MG tablet Take 1 tablet (4 mg total) by mouth daily after supper. (Patient taking differently: Take 2 mg by mouth at bedtime. )    . albuterol (PROVENTIL HFA;VENTOLIN HFA) 108 (90 Base) MCG/ACT  inhaler Inhale 2 puffs into the lungs every 6 (six) hours as needed for wheezing or shortness of breath. (Patient not taking: Reported on 09/04/2016) 1 Inhaler 0  . benzonatate (TESSALON PERLES) 100 MG capsule Take 1 capsule (100 mg total) by mouth 3 (three) times daily as needed for cough (Take 1-2 per dose). (Patient not taking: Reported on 09/04/2016) 30 capsule 0  . buPROPion (WELLBUTRIN XL) 150 MG 24 hr tablet Take 1 tablet (150 mg total) by mouth daily. 30 tablet 0  . cephALEXin (KEFLEX) 500 MG capsule Take 1 capsule (500 mg total) by mouth 3 (three) times daily. (Patient not taking: Reported on 09/04/2016) 30 capsule 0  . dicyclomine (BENTYL) 20 MG tablet Take 1 tablet (20 mg total) by mouth 3 (three) times daily as needed (abdominal pain). (Patient not taking: Reported on 09/04/2016) 30 tablet 0  . doxycycline (VIBRA-TABS) 100 MG tablet Take 1 tablet (100 mg total) by mouth 2 (two) times daily. (Patient not taking: Reported on 09/04/2016)  10 tablet 0  . HYDROcodone-acetaminophen (NORCO) 5-325 MG tablet Take 1 tablet by mouth every 6 (six) hours as needed for moderate pain. (Patient not taking: Reported on 09/04/2016) 15 tablet 0  . hydrOXYzine (ATARAX/VISTARIL) 10 MG tablet Take 1 tablet (10 mg total) by mouth 3 (three) times daily as needed. (Patient not taking: Reported on 09/04/2016) 30 tablet 0  . predniSONE (DELTASONE) 20 MG tablet Take 3 tablets (60 mg total) by mouth daily. (Patient not taking: Reported on 09/04/2016) 15 tablet 0    Musculoskeletal: Strength & Muscle Tone: within normal limits Gait & Station: normal Patient leans: N/A  Psychiatric Specialty Exam: Physical Exam  Nursing note and vitals reviewed. Constitutional: She appears well-developed and well-nourished.  HENT:  Head: Normocephalic and atraumatic.  Eyes: Conjunctivae are normal. Pupils are equal, round, and reactive to light.  Neck: Normal range of motion.  Cardiovascular: Regular rhythm and normal heart sounds.    Respiratory: Effort normal. No respiratory distress.  GI: Soft.  Musculoskeletal: Normal range of motion.  Neurological: She is alert.  Skin: Skin is warm and dry.  Psychiatric: She has a normal mood and affect. Her speech is normal and behavior is normal. Thought content normal. Cognition and memory are normal. She expresses impulsivity. She expresses no suicidal ideation.    Review of Systems  Constitutional: Negative.   HENT: Negative.   Eyes: Negative.   Respiratory: Negative.   Cardiovascular: Negative.   Gastrointestinal: Negative.   Musculoskeletal: Positive for back pain, joint pain and myalgias.  Skin: Negative.   Neurological: Negative.   Psychiatric/Behavioral: Positive for depression. Negative for hallucinations, memory loss, substance abuse and suicidal ideas. The patient is nervous/anxious and has insomnia.     Blood pressure (!) 176/91, pulse 85, temperature 97.6 F (36.4 C), temperature source Oral, resp. rate 18, height 5' 3" (1.6 m), weight 93 kg (205 lb), SpO2 99 %.Body mass index is 36.31 kg/m.  General Appearance: Fairly Groomed  Eye Contact:  Fair  Speech:  Normal Rate  Volume:  Normal  Mood:  Euthymic  Affect:  Congruent  Thought Process:  Goal Directed  Orientation:  Full (Time, Place, and Person)  Thought Content:  Logical  Suicidal Thoughts:  No  Homicidal Thoughts:  No  Memory:  Immediate;   Good Recent;   Fair Remote;   Fair  Judgement:  Fair  Insight:  Fair  Psychomotor Activity:  Normal  Concentration:  Concentration: Fair  Recall:  [employer] of Knowledge:  Fair  Language:  Fair  Akathisia:  No  Handed:  Right  AIMS (if indicated):     Assets:  Desire for Improvement Housing Physical Health  ADL's:  Intact  Cognition:  WNL  Sleep:        Treatment Plan Summary: Medication management and Plan This is a 61 year old woman with a history of depression. Currently denies suicidal or homicidal ideation. Affect is euthymic. Reports that  she was going to be prescribed Wellbutrin. I reviewed with her that Wellbutrin is actually listed as an allergy for her. Patient says that she does not think that is actually correct. She says she has discussed with her outpatient psychiatrist who also did not think she really had no allergic reaction. Patient would like to start bupropion 150 mg. Continue other medication. I did some psychoeducation with her about how she may be having some effects of her prescribed medication on making her more tired and decreasing her motivation. Case reviewed with the ER physician. Patient  can be discharged home and will follow-up with her outpatient psychiatrist.  Disposition: Patient does not meet criteria for psychiatric inpatient admission. Supportive therapy provided about ongoing stressors.  Alethia Berthold, MD 09/05/2016 12:20 PM

## 2017-02-20 ENCOUNTER — Other Ambulatory Visit: Payer: Self-pay | Admitting: Nurse Practitioner

## 2017-02-20 DIAGNOSIS — Z1289 Encounter for screening for malignant neoplasm of other sites: Secondary | ICD-10-CM

## 2017-09-26 ENCOUNTER — Telehealth: Payer: Self-pay | Admitting: Gastroenterology

## 2017-09-26 NOTE — Telephone Encounter (Signed)
Pt left vm to set up colonoscopy

## 2017-10-04 ENCOUNTER — Encounter: Payer: Self-pay | Admitting: *Deleted

## 2017-10-18 ENCOUNTER — Other Ambulatory Visit: Payer: Self-pay

## 2017-10-18 ENCOUNTER — Telehealth: Payer: Self-pay | Admitting: Gastroenterology

## 2017-10-18 DIAGNOSIS — Z860100 Personal history of colon polyps, unspecified: Secondary | ICD-10-CM

## 2017-10-18 NOTE — Telephone Encounter (Signed)
Spoke with patient for her correct insurance information. She said she would call the office later with that information.

## 2017-10-30 ENCOUNTER — Telehealth: Payer: Self-pay | Admitting: Gastroenterology

## 2017-10-30 NOTE — Telephone Encounter (Signed)
Patients colonoscopy has been rescheduled from tomorrow to July 1st with Dr. Bonna Gains at [facility].  Kieth Brightly in Endo has been informed.  Thanks [employer]

## 2017-10-30 NOTE — Telephone Encounter (Signed)
Pt left vm to reschedule her procedure for 10/31/17

## 2017-10-31 ENCOUNTER — Encounter: Admission: RE | Payer: Self-pay | Source: Ambulatory Visit

## 2017-10-31 ENCOUNTER — Ambulatory Visit: Admission: RE | Admit: 2017-10-31 | Payer: Medicare Other | Source: Ambulatory Visit | Admitting: Gastroenterology

## 2017-10-31 SURGERY — COLONOSCOPY WITH PROPOFOL
Anesthesia: General

## 2017-11-13 ENCOUNTER — Encounter: Admission: RE | Payer: Self-pay | Source: Ambulatory Visit

## 2017-11-13 ENCOUNTER — Ambulatory Visit: Admission: RE | Admit: 2017-11-13 | Payer: Medicare Other | Source: Ambulatory Visit | Admitting: Gastroenterology

## 2017-11-13 SURGERY — COLONOSCOPY WITH PROPOFOL
Anesthesia: General

## 2017-12-26 ENCOUNTER — Other Ambulatory Visit: Payer: Self-pay

## 2017-12-26 DIAGNOSIS — Z86010 Personal history of colonic polyps: Secondary | ICD-10-CM

## 2017-12-26 MED ORDER — PEG 3350-KCL-NABCB-NACL-NASULF 236 G PO SOLR: 4000.0000 mL | Freq: Once | ORAL | 0 refills | Status: AC

## 2017-12-29 ENCOUNTER — Encounter: Payer: Self-pay | Admitting: Emergency Medicine

## 2018-01-01 ENCOUNTER — Ambulatory Visit: Payer: Medicare Other | Admitting: Registered Nurse

## 2018-01-01 ENCOUNTER — Encounter
Admission: RE | Disposition: A | Discharge status: Home / Self Care | Payer: Self-pay | Source: Ambulatory Visit | Attending: Gastroenterology

## 2018-01-01 ENCOUNTER — Ambulatory Visit
Admission: RE | Admit: 2018-01-01 | Discharge: 2018-01-01 | Disposition: A | Discharge status: Home / Self Care | Payer: Medicare Other | Source: Ambulatory Visit | Attending: Gastroenterology | Admitting: Gastroenterology

## 2018-01-01 ENCOUNTER — Encounter: Payer: Self-pay | Admitting: *Deleted

## 2018-01-01 DIAGNOSIS — D124 Benign neoplasm of descending colon: Secondary | ICD-10-CM | POA: Insufficient documentation

## 2018-01-01 DIAGNOSIS — Z800 Family history of malignant neoplasm of digestive organs: Secondary | ICD-10-CM | POA: Insufficient documentation

## 2018-01-01 DIAGNOSIS — Z79899 Other long term (current) drug therapy: Secondary | ICD-10-CM | POA: Insufficient documentation

## 2018-01-01 DIAGNOSIS — Z860100 Personal history of colon polyps, unspecified: Secondary | ICD-10-CM

## 2018-01-01 DIAGNOSIS — F603 Borderline personality disorder: Secondary | ICD-10-CM | POA: Insufficient documentation

## 2018-01-01 DIAGNOSIS — M797 Fibromyalgia: Secondary | ICD-10-CM | POA: Diagnosis not present

## 2018-01-01 DIAGNOSIS — Z9884 Bariatric surgery status: Secondary | ICD-10-CM | POA: Insufficient documentation

## 2018-01-01 DIAGNOSIS — Z888 Allergy status to other drugs, medicaments and biological substances status: Secondary | ICD-10-CM | POA: Insufficient documentation

## 2018-01-01 DIAGNOSIS — Z1211 Encounter for screening for malignant neoplasm of colon: Secondary | ICD-10-CM | POA: Diagnosis not present

## 2018-01-01 DIAGNOSIS — D123 Benign neoplasm of transverse colon: Secondary | ICD-10-CM | POA: Diagnosis not present

## 2018-01-01 DIAGNOSIS — F329 Major depressive disorder, single episode, unspecified: Secondary | ICD-10-CM | POA: Diagnosis not present

## 2018-01-01 HISTORY — PX: COLONOSCOPY WITH PROPOFOL: SHX5780

## 2018-01-01 SURGERY — COLONOSCOPY WITH PROPOFOL
Anesthesia: General

## 2018-01-01 MED ORDER — MIDAZOLAM HCL 5 MG/5ML IJ SOLN
INTRAMUSCULAR | Status: DC | PRN
  Administered 2018-01-01: 1 mg via INTRAVENOUS

## 2018-01-01 MED ORDER — SODIUM CHLORIDE 0.9 % IV SOLN
INTRAVENOUS | Status: DC
  Administered 2018-01-01: 1000 mL via INTRAVENOUS

## 2018-01-01 MED ORDER — MIDAZOLAM HCL 2 MG/2ML IJ SOLN
INTRAMUSCULAR | Status: AC
  Filled 2018-01-01: qty 2

## 2018-01-01 MED ORDER — LIDOCAINE HCL (CARDIAC) PF 100 MG/5ML IV SOSY
PREFILLED_SYRINGE | INTRAVENOUS | Status: DC | PRN
  Administered 2018-01-01: 60 mg via INTRAVENOUS

## 2018-01-01 MED ORDER — PROPOFOL 10 MG/ML IV BOLUS
INTRAVENOUS | Status: DC | PRN
  Administered 2018-01-01: 70 mg via INTRAVENOUS

## 2018-01-01 MED ORDER — PROPOFOL 500 MG/50ML IV EMUL
INTRAVENOUS | Status: DC | PRN
  Administered 2018-01-01: 140 ug/kg/min via INTRAVENOUS

## 2018-01-01 NOTE — Anesthesia Postprocedure Evaluation (Signed)
Anesthesia Post Note  Patient: Ann Bowen  Procedure(s) Performed: COLONOSCOPY WITH PROPOFOL (N/A )  Patient location during evaluation: PACU Anesthesia Type: General Level of consciousness: awake and alert Pain management: pain level controlled Vital Signs Assessment: post-procedure vital signs reviewed and stable Respiratory status: spontaneous breathing, nonlabored ventilation and respiratory function stable Cardiovascular status: blood pressure returned to baseline and stable Postop Assessment: no apparent nausea or vomiting Anesthetic complications: no     Last Vitals:  Vitals:   01/01/18 1040 01/01/18 1050  BP: (!) 98/57   Pulse: 67 65  Resp: 18 18  Temp:    SpO2: 99% 99%    Last Pain:  Vitals:   01/01/18 1030  TempSrc: Tympanic  PainSc:                  Durenda Hurt

## 2018-01-01 NOTE — Anesthesia Post-op Follow-up Note (Signed)
Anesthesia QCDR form completed.        

## 2018-01-01 NOTE — Anesthesia Preprocedure Evaluation (Addendum)
Anesthesia Evaluation  Patient identified by MRN, date of birth, ID band Patient awake    Reviewed: Allergy & Precautions, H&P , NPO status   Airway Mallampati: III  TM Distance: >3 FB Neck ROM: full    Dental  (+) Chipped, Missing   Pulmonary neg pulmonary ROS, neg shortness of breath,    breath sounds clear to auscultation       Cardiovascular (-) angina(-) Past MI, (-) Cardiac Stents and (-) CHF negative cardio ROS   Rhythm:regular Rate:Normal     Neuro/Psych PSYCHIATRIC DISORDERS Depression  Neuromuscular disease negative neurological ROS  negative psych ROS   GI/Hepatic negative GI ROS, Neg liver ROS,   Endo/Other  negative endocrine ROS  Renal/GU negative Renal ROS  negative genitourinary   Musculoskeletal  (+) Arthritis , Fibromyalgia -  Abdominal   Peds  Hematology negative hematology ROS (+)   Anesthesia Other Findings Pt concerned that she "woke up" during her last two colonoscopies.    Past Medical History: No date: Arthritis No date: Borderline personality disorder ([place]) No date: Depression No date: Fibromyalgia No date: Shingles  Past Surgical History: No date: CHOLECYSTECTOMY No date: GASTRIC BYPASS No date: SHOULDER SURGERY  BMI    Body Mass Index:  41.63 kg/m      Reproductive/Obstetrics negative OB ROS                            Anesthesia Physical Anesthesia Plan  ASA: II  Anesthesia Plan: General   Post-op Pain Management:    Induction:   PONV Risk Score and Plan: TIVA and Midazolam  Airway Management Planned:   Additional Equipment:   Intra-op Plan:   Post-operative Plan:   Informed Consent: I have reviewed the patients History and Physical, chart, labs and discussed the procedure including the risks, benefits and alternatives for the proposed anesthesia with the patient or authorized representative who has indicated his/her understanding  and acceptance.   Dental Advisory Given  Plan Discussed with: Anesthesiologist, CRNA and Surgeon  Anesthesia Plan Comments:        Anesthesia Quick Evaluation

## 2018-01-01 NOTE — H&P (Signed)
Ann Darby, MD [address]  [place]  [place], [place] [address]  Main: [phone]  Fax: [phone] Pager: [phone]  Primary Care Physician:  [facility] Primary Gastroenterologist:  Dr. Cephas Bowen  Pre-Procedure History & Physical: HPI:  Ann Bowen is a 62 y.o. female is here for an colonoscopy.   Past Medical History:  Diagnosis Date  . Arthritis   . Borderline personality disorder ([place])   . Depression   . Fibromyalgia   . Shingles     Past Surgical History:  Procedure Laterality Date  . CHOLECYSTECTOMY    . GASTRIC BYPASS    . SHOULDER SURGERY      Prior to Admission medications   Medication Sig Start Date End Date Taking? Authorizing Provider  clonazePAM (KLONOPIN) 1 MG tablet Take 1 mg by mouth 2 (two) times daily.   Yes [provider]  rOPINIRole (REQUIP) 4 MG tablet Take 1 tablet (4 mg total) by mouth daily after supper. Patient taking differently: Take 2 mg by mouth at bedtime.  08/12/15  Yes Hildred Priest, MD  DULoxetine (CYMBALTA) 60 MG capsule Take 2 capsules (120 mg total) by mouth daily. Patient not taking: Reported on 01/01/2018 08/12/15   Hildred Priest, MD  QUEtiapine (SEROQUEL) 300 MG tablet Take by mouth. 03/16/15   [provider]    Allergies as of 12/26/2017 - Review Complete 09/04/2016  Allergen Reaction Noted  . Wellbutrin [bupropion] Hives and Rash 12/06/2014    History reviewed. No pertinent family history.  Social History   Socioeconomic History  . Marital status: Married    Spouse name: Not on file  . Number of children: Not on file  . Years of education: Not on file  . Highest education level: Not on file  Occupational History  . Not on file  Social Needs  . Financial resource strain: Not on file  . Food insecurity:    Worry: Not on file    Inability: Not on file  . Transportation needs:    Medical: Not on file    Non-medical: Not on file    Tobacco Use  . Smoking status: Never Smoker  . Smokeless tobacco: Never Used  Substance and Sexual Activity  . Alcohol use: Yes    Comment: occasionaly  . Drug use: Never  . Sexual activity: Not on file  Lifestyle  . Physical activity:    Days per week: Not on file    Minutes per session: Not on file  . Stress: Not on file  Relationships  . Social connections:    Talks on phone: Not on file    Gets together: Not on file    Attends religious service: Not on file    Active member of club or organization: Not on file    Attends meetings of clubs or organizations: Not on file    Relationship status: Not on file  . Intimate partner violence:    Fear of current or ex partner: Not on file    Emotionally abused: Not on file    Physically abused: Not on file    Forced sexual activity: Not on file  Other Topics Concern  . Not on file  Social History Narrative   ** Merged History Encounter **        Review of Systems: See HPI, otherwise negative ROS  Physical Exam: BP (!) 144/78   Pulse 65   Temp (!) 96.1 F (35.6 C) (Tympanic)   Resp 18  Ht 5\' 3"  (1.6 m)   Wt 106.6 kg   SpO2 100%   BMI 41.63 kg/m  General:   Alert,  pleasant and cooperative in NAD Head:  Normocephalic and atraumatic. Neck:  Supple; no masses or thyromegaly. Lungs:  Clear throughout to auscultation.    Heart:  Regular rate and rhythm. Abdomen:  Soft, nontender and nondistended. Normal bowel sounds, without guarding, and without rebound.   Neurologic:  Alert and  oriented x4;  grossly normal neurologically.  Impression/Plan: Ann Bowen is here for an colonoscopy to be performed for h/o colon polyps  Risks, benefits, limitations, and alternatives regarding  colonoscopy have been reviewed with the patient.  Questions have been answered.  All parties agreeable.   Sherri Sear, MD  01/01/2018, 9:43 AM

## 2018-01-01 NOTE — Op Note (Signed)
[facility] Gastroenterology Patient Name: Ann Bowen Procedure Date: 01/01/2018 9:32 AM MRN: [id number] Account #: [id number] Date of Birth: June 20, 1955 Admit Type: Outpatient Age: 62 Room: [facility] ENDO ROOM 3 Gender: Female Note Status: Finalized Procedure:            Colonoscopy Indications:          High risk colon cancer surveillance: Personal history                        of colonic polyps, Family history of colon cancer in a                        first-degree relative Providers:            Lisvet Rasheed Raeanne Gathers MD, MD Referring MD:         Stronach, MD (Referring MD) Medicines:            Monitored Anesthesia Care Complications:        No immediate complications. Estimated blood loss: None. Procedure:            Pre-Anesthesia Assessment:                       - Prior to the procedure, a History and Physical was                        performed, and patient medications and allergies were                        reviewed. The patient is competent. The risks and                        benefits of the procedure and the sedation options and                        risks were discussed with the patient. All questions                        were answered and informed consent was obtained.                        Patient identification and proposed procedure were                        verified by the physician, the nurse, the                        anesthesiologist, the anesthetist and the technician in                        the pre-procedure area in the procedure room in the                        endoscopy suite. Mental Status Examination: alert and                        oriented. Airway Examination: normal oropharyngeal                        airway and neck mobility. Respiratory Examination:  clear to auscultation. CV Examination: normal.                        Prophylactic Antibiotics: The patient does not require                         prophylactic antibiotics. Prior Anticoagulants: The                        patient has taken no previous anticoagulant or                        antiplatelet agents. ASA Grade Assessment: II - A                        patient with mild systemic disease. After reviewing the                        risks and benefits, the patient was deemed in                        satisfactory condition to undergo the procedure. The                        anesthesia plan was to use monitored anesthesia care                        (MAC). Immediately prior to administration of                        medications, the patient was re-assessed for adequacy                        to receive sedatives. The heart rate, respiratory rate,                        oxygen saturations, blood pressure, adequacy of                        pulmonary ventilation, and response to care were                        monitored throughout the procedure. The physical status                        of the patient was re-assessed after the procedure.                       After obtaining informed consent, the colonoscope was                        passed under direct vision. Throughout the procedure,                        the patient's blood pressure, pulse, and oxygen                        saturations were monitored continuously. The                        Colonoscope  was introduced through the anus and                        advanced to the the cecum, identified by appendiceal                        orifice and ileocecal valve. The colonoscopy was                        performed without difficulty. The patient tolerated the                        procedure well. The quality of the bowel preparation                        was evaluated using the BBPS [facility] Bowel Preparation                        Scale) with scores of: Right Colon = 3, Transverse                        Colon = 3 and Left Colon = 3 (entire  mucosa seen well                        with no residual staining, small fragments of stool or                        opaque liquid). The total BBPS score equals 9. Findings:      The perianal and digital rectal examinations were normal. Pertinent       negatives include normal sphincter tone and no palpable rectal lesions.      A 5 mm polyp was found in the transverse colon. The polyp was sessile.       The polyp was removed with a cold snare. Resection and retrieval were       complete.      A 8 mm polyp was found in the descending colon. The polyp was flat. The       polyp was removed with a hot snare. Resection and retrieval were       complete.      The retroflexed view of the distal rectum and anal verge was normal and       showed no anal or rectal abnormalities. Impression:           - One 5 mm polyp in the transverse colon, removed with                        a cold snare. Resected and retrieved.                       - One 8 mm polyp in the descending colon, removed with                        a hot snare. Resected and retrieved.                       - The distal rectum and anal verge are normal on  retroflexion view. Recommendation:       - Discharge patient to home (with escort).                       - Resume previous diet today.                       - Continue present medications.                       - Await pathology results.                       - Repeat colonoscopy in 5 years for surveillance. Procedure Code(s):    --- Professional ---                       [phone], Colonoscopy, flexible; with removal of tumor(s),                        polyp(s), or other lesion(s) by snare technique Diagnosis Code(s):    --- Professional ---                       Z86.010, Personal history of colonic polyps                       D12.3, Benign neoplasm of transverse colon (hepatic                        flexure or splenic flexure)                       D12.4,  Benign neoplasm of descending colon                       Z80.0, Family history of malignant neoplasm of                        digestive organs CPT copyright 2017 American Medical Association. All rights reserved. The codes documented in this report are preliminary and upon coder review may  be revised to meet current compliance requirements. Dr. Ulyess Mort Lin Landsman MD, MD 01/01/2018 10:34:34 AM This report has been signed electronically. Number of Addenda: 0 Note Initiated On: 01/01/2018 9:32 AM Scope Withdrawal Time: 0 hours 24 minutes 48 seconds  Total Procedure Duration: 0 hours 32 minutes 56 seconds       [facility]

## 2018-01-01 NOTE — Transfer of Care (Signed)
Immediate Anesthesia Transfer of Care Note  Patient: Ann Bowen  Procedure(s) Performed: COLONOSCOPY WITH PROPOFOL (N/A )  Patient Location: PACU  Anesthesia Type:General  Level of Consciousness: sedated  Airway & Oxygen Therapy: Patient Spontanous Breathing and Patient connected to nasal cannula oxygen  Post-op Assessment: Report given to RN and Post -op Vital signs reviewed and stable  Post vital signs: Reviewed and stable  Last Vitals:  Vitals Value Taken Time  BP 110/63 01/01/2018 10:30 AM  Temp 36.1 C 01/01/2018 10:30 AM  Pulse 67 01/01/2018 10:30 AM  Resp 18 01/01/2018 10:30 AM  SpO2 98 % 01/01/2018 10:30 AM    Last Pain:  Vitals:   01/01/18 1030  TempSrc: Tympanic  PainSc:          Complications: No apparent anesthesia complications

## 2018-01-02 LAB — SURGICAL PATHOLOGY

## 2018-01-03 ENCOUNTER — Encounter: Payer: Self-pay | Admitting: Gastroenterology

## 2018-01-08 ENCOUNTER — Telehealth: Payer: Self-pay | Admitting: Gastroenterology

## 2018-01-08 NOTE — Telephone Encounter (Signed)
Patient has been notified of results and verbalized understanding.

## 2018-01-08 NOTE — Telephone Encounter (Signed)
Patient hasn't heard back from her procedure.

## 2018-02-20 ENCOUNTER — Other Ambulatory Visit: Payer: Self-pay | Admitting: Nurse Practitioner

## 2018-02-20 DIAGNOSIS — Z1231 Encounter for screening mammogram for malignant neoplasm of breast: Secondary | ICD-10-CM

## 2018-05-06 ENCOUNTER — Other Ambulatory Visit: Payer: Self-pay

## 2018-05-06 ENCOUNTER — Emergency Department
Admission: EM | Admit: 2018-05-06 | Discharge: 2018-05-06 | Disposition: A | Discharge status: Home / Self Care | Payer: Medicare Other | Attending: Emergency Medicine | Admitting: Emergency Medicine

## 2018-05-06 DIAGNOSIS — R112 Nausea with vomiting, unspecified: Secondary | ICD-10-CM | POA: Diagnosis not present

## 2018-05-06 DIAGNOSIS — L299 Pruritus, unspecified: Secondary | ICD-10-CM | POA: Diagnosis not present

## 2018-05-06 DIAGNOSIS — Z79899 Other long term (current) drug therapy: Secondary | ICD-10-CM | POA: Insufficient documentation

## 2018-05-06 DIAGNOSIS — G4700 Insomnia, unspecified: Secondary | ICD-10-CM | POA: Diagnosis present

## 2018-05-06 MED ORDER — HYDROXYZINE HCL 25 MG PO TABS
25.0000 mg | ORAL_TABLET | ORAL | Status: AC
  Administered 2018-05-06: 25 mg via ORAL
  Filled 2018-05-06: qty 1

## 2018-05-06 MED ORDER — HYDROXYZINE HCL 25 MG PO TABS: 25.0000 mg | ORAL_TABLET | Freq: Three times a day (TID) | ORAL | 0 refills | Status: DC | PRN

## 2018-05-06 NOTE — ED Triage Notes (Signed)
Pt states "when is start to fall asleep I get frantic". Pt states she has not been able to sleep in 3-4 days. Pt denies other symptoms. Pt states has had intermittent nausea at night over last few weeks with intermittent night vomiting.

## 2018-05-06 NOTE — ED Provider Notes (Signed)
[facility] Emergency Department Provider Note  ____________________________________________   First MD Initiated Contact with Patient 05/06/18 [phone]     (approximate)  I have reviewed the triage vital signs and the nursing notes.   HISTORY  Chief Complaint Insomnia    HPI Ann Bowen is a 62 y.o. female with medical and psychiatric history as listed below who presents for evaluation of insomnia.  She states that for the last 3 to 4 days every time she tries to go to sleep she gets increasingly agitated and "irritable".  She has been taking her regular medications including clonazepam 3 times daily but it does not seem to help.  Sometimes she has nausea at night and says that she has been vomiting at night fairly consistently for an extended period of time.  She describes the symptoms as severe and nothing in particular makes it better or worse.  However by the time I saw her she says that she thinks that she figured out what is going on.  She was recently started on gabapentin and she believes that it is the medication side effect.  She states that she is also feeling itchy all over without any specific rash and she seems more irritable after taking the gabapentin.  She plans to stop taking it.  Benadryl does not seem to have made her symptoms any better.  Past Medical History:  Diagnosis Date  . Arthritis   . Borderline personality disorder ([place])   . Depression   . Fibromyalgia   . Shingles     Patient Active Problem List   Diagnosis Date Noted  . History of colonic polyps   . Borderline personality disorder ([place]) 08/10/2015  . MDD (major depressive disorder), recurrent episode ([place]) 08/10/2015  . Fibromyalgia 08/10/2015  . Osteoarthritis 08/10/2015  . Tobacco use disorder 08/10/2015    Past Surgical History:  Procedure Laterality Date  . CHOLECYSTECTOMY    . COLONOSCOPY WITH PROPOFOL N/A 01/01/2018   Procedure: COLONOSCOPY WITH PROPOFOL;   Surgeon: Lin Landsman, MD;  Location: [facility] ENDOSCOPY;  Service: Gastroenterology;  Laterality: N/A;  . GASTRIC BYPASS    . SHOULDER SURGERY      Prior to Admission medications   Medication Sig Start Date End Date Taking? Authorizing Provider  clonazePAM (KLONOPIN) 1 MG tablet Take 1 mg by mouth 2 (two) times daily.    [provider]  DULoxetine (CYMBALTA) 60 MG capsule Take 2 capsules (120 mg total) by mouth daily. Patient not taking: Reported on 01/01/2018 08/12/15   Hildred Priest, MD  hydrOXYzine (ATARAX/VISTARIL) 25 MG tablet Take 1 tablet (25 mg total) by mouth 3 (three) times daily as needed for itching. 05/06/18   Hinda Kehr, MD  QUEtiapine (SEROQUEL) 300 MG tablet Take by mouth. 03/16/15   [provider]  rOPINIRole (REQUIP) 4 MG tablet Take 1 tablet (4 mg total) by mouth daily after supper. Patient taking differently: Take 2 mg by mouth at bedtime.  08/12/15   Hildred Priest, MD    Allergies Wellbutrin [bupropion]  No family history on file.  Social History Social History   Tobacco Use  . Smoking status: Never Smoker  . Smokeless tobacco: Never Used  Substance Use Topics  . Alcohol use: Yes    Comment: occasionaly  . Drug use: Never    Review of Systems Constitutional: No fever/chills Cardiovascular: Denies chest pain. Respiratory: Denies shortness of breath. Gastrointestinal: No abdominal pain.  Vomiting at night for an extended period of time.  Musculoskeletal: Negative for neck pain.  Negative for back pain. Integumentary: Negative for rash but itchy over her body over the last 3 to 4 days along with the insomnia Neurological: Negative for headaches, focal weakness or numbness. Psych: Irritable, insomnia, agitated  ____________________________________________   PHYSICAL EXAM:  VITAL SIGNS: ED Triage Vitals [05/06/18 0107]  Enc Vitals Group     BP (!) 158/70     Pulse Rate 70     Resp 16     Temp 97.8  F (36.6 C)     Temp Source Oral     SpO2 100 %     Weight 97.5 kg (215 lb)     Height 1.6 m (5\' 3" )     Head Circumference      Peak Flow      Pain Score 0     Pain Loc      Pain Edu?      Excl. in [place]?     Constitutional: Alert and oriented. Well appearing and in no acute distress. Eyes: Conjunctivae are normal.  Head: Atraumatic. Cardiovascular: Normal rate, regular rhythm. Good peripheral circulation. Respiratory: Normal respiratory effort.  No retractions. Lungs CTAB. Musculoskeletal: No lower extremity tenderness nor edema. No gross deformities of extremities. Neurologic:  Normal speech and language. No gross focal neurologic deficits are appreciated.  Skin:  Skin is warm, dry and intact. No rash noted although the patient indicates pruritus on her arms in particular. Psychiatric: Mood and affect are slightly agitated but generally appropriate under the circumstances.  ____________________________________________   LABS (all labs ordered are listed, but only abnormal results are displayed)  Labs Reviewed - No data to display ____________________________________________  EKG  None - EKG not ordered by ED physician ____________________________________________  RADIOLOGY   ED MD interpretation: No indication for imaging  Official radiology report(s): No results found.  ____________________________________________   PROCEDURES  Critical Care performed: No   Procedure(s) performed:   Procedures   ____________________________________________   INITIAL IMPRESSION / ASSESSMENT AND PLAN / ED COURSE  As part of my medical decision making, I reviewed the following data within the [place] notes reviewed and incorporated and [place] chart reviewed    The patient is agitated and having insomnia and she believes that is the result of her gabapentin.  This may very well be the case.  She could also be having issues with her ongoing  psychiatric diagnoses, but she is not representing a danger to herself or others, has the capacity to make her own decisions, and has outpatient follow-up available to her.  She does not meet criteria for involuntary commitment and does not urgent psychiatric evaluation tonight.  Her vital signs are stable and she is in no acute distress at this time.  There is no indication for further medical work-up.  I am giving her a dose of Atarax 25 mg by mouth and will write her prescription for the same.  I gave my usual customary return precautions.     ____________________________________________  FINAL CLINICAL IMPRESSION(S) / ED DIAGNOSES  Final diagnoses:  Insomnia, unspecified type  Pruritus     MEDICATIONS GIVEN DURING THIS VISIT:  Medications  hydrOXYzine (ATARAX/VISTARIL) tablet 25 mg (25 mg Oral Given 05/06/18 0405)     ED Discharge Orders         Ordered    hydrOXYzine (ATARAX/VISTARIL) 25 MG tablet  3 times daily PRN     05/06/18 0417  Note:  This document was prepared using Dragon voice recognition software and may include unintentional dictation errors.    Hinda Kehr, MD 05/06/18 [phone]

## 2018-05-06 NOTE — ED Notes (Signed)
Pt says "I haven't been able to sleep in I don't know how long and I feel like I'm losing my grip"; pt was short in speaking with staff when she arrived and then smiling and laughing at other times

## 2018-05-06 NOTE — Discharge Instructions (Addendum)
As we discussed, we are not certain exactly what is causing your insomnia and itching, but we agree with your plan to not take anymore gabapentin and try taking the Atarax (hydroxyzine) I have prescribed.  Please follow-up with your regular doctor at the next available opportunity.

## 2018-05-29 ENCOUNTER — Other Ambulatory Visit: Payer: Self-pay

## 2018-05-29 ENCOUNTER — Emergency Department: Payer: Medicare Other

## 2018-05-29 ENCOUNTER — Encounter: Payer: Self-pay | Admitting: Emergency Medicine

## 2018-05-29 ENCOUNTER — Emergency Department
Admission: EM | Admit: 2018-05-29 | Discharge: 2018-05-29 | Disposition: A | Discharge status: Home / Self Care | Payer: Medicare Other | Attending: Emergency Medicine | Admitting: Emergency Medicine

## 2018-05-29 DIAGNOSIS — Y999 Unspecified external cause status: Secondary | ICD-10-CM | POA: Diagnosis not present

## 2018-05-29 DIAGNOSIS — W010XXA Fall on same level from slipping, tripping and stumbling without subsequent striking against object, initial encounter: Secondary | ICD-10-CM | POA: Diagnosis not present

## 2018-05-29 DIAGNOSIS — Z79899 Other long term (current) drug therapy: Secondary | ICD-10-CM | POA: Insufficient documentation

## 2018-05-29 DIAGNOSIS — Y939 Activity, unspecified: Secondary | ICD-10-CM | POA: Insufficient documentation

## 2018-05-29 DIAGNOSIS — S9031XA Contusion of right foot, initial encounter: Secondary | ICD-10-CM

## 2018-05-29 DIAGNOSIS — S99921A Unspecified injury of right foot, initial encounter: Secondary | ICD-10-CM | POA: Diagnosis present

## 2018-05-29 DIAGNOSIS — M79671 Pain in right foot: Secondary | ICD-10-CM

## 2018-05-29 DIAGNOSIS — Y929 Unspecified place or not applicable: Secondary | ICD-10-CM | POA: Diagnosis not present

## 2018-05-29 IMAGING — CR DG FOOT COMPLETE 3+V*R*
1 series · 3 of 3 positions shown · non-contrast
Comparison: None.

CLINICAL DATA: Right foot pain since [REDACTED]

EXAM:
RIGHT FOOT COMPLETE - 3+ VIEW

[Series 1: dg foot complete right · 0.14mm/px · 3 of 3 slices shown]
[im 1/3]
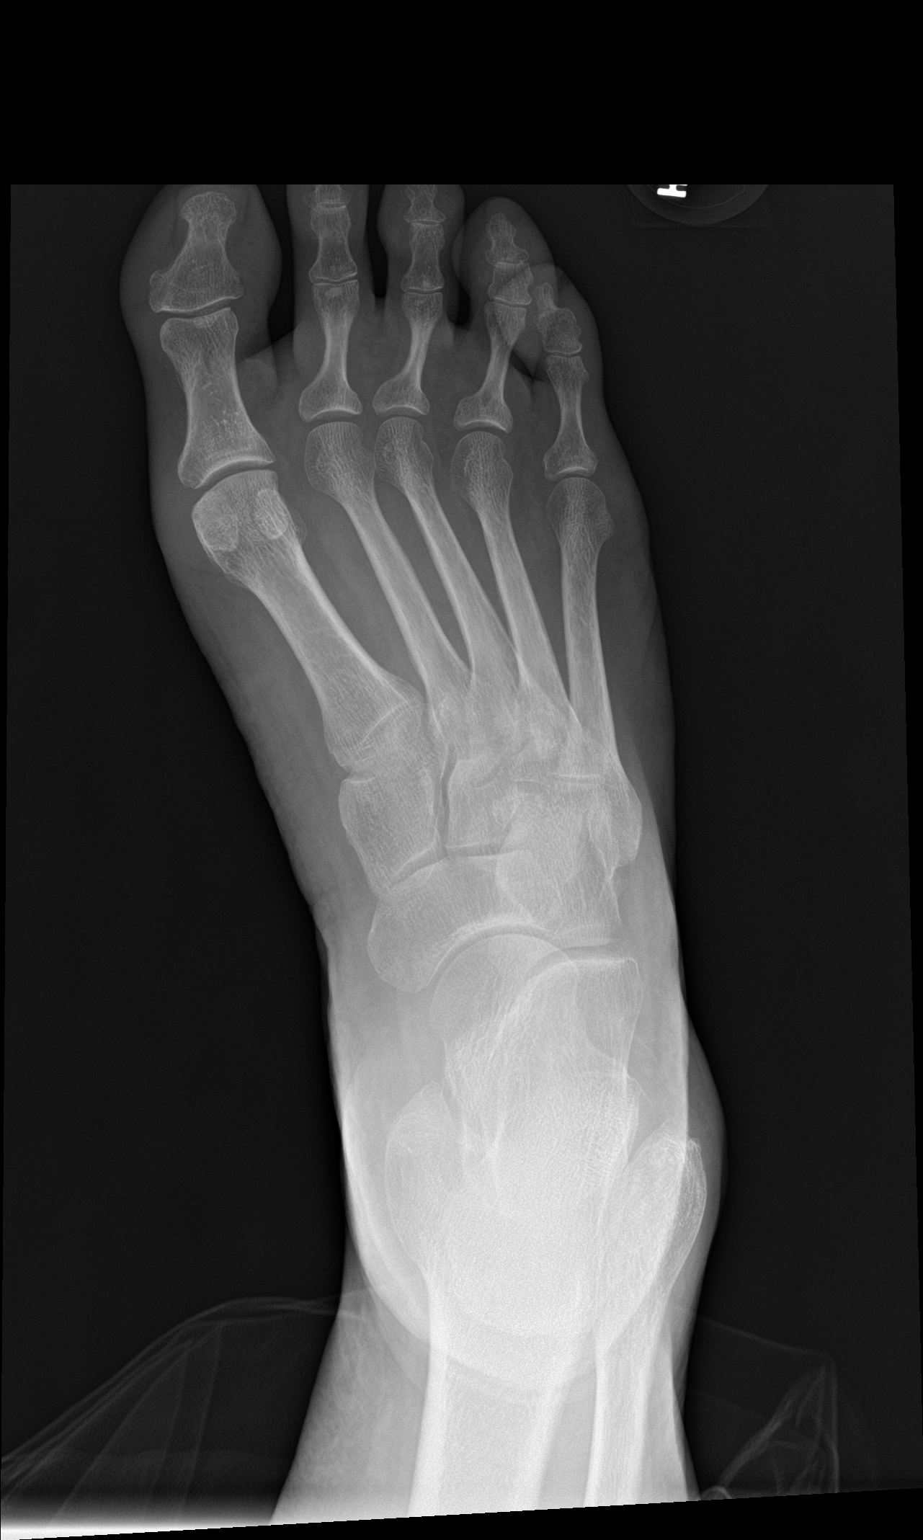
[im 2/3]
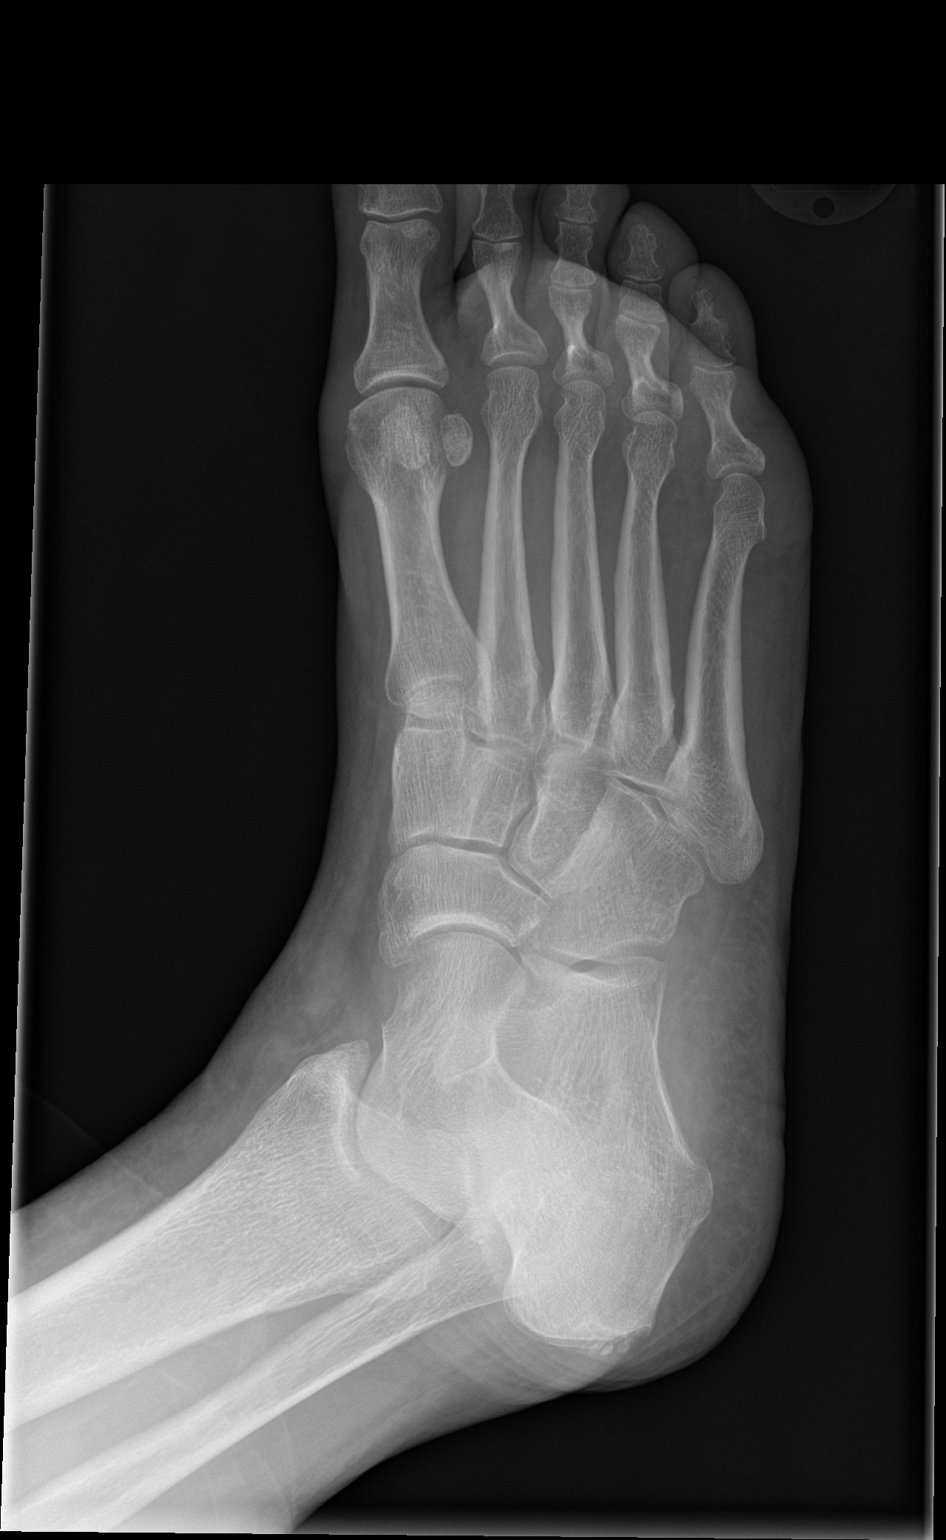
[im 3/3]
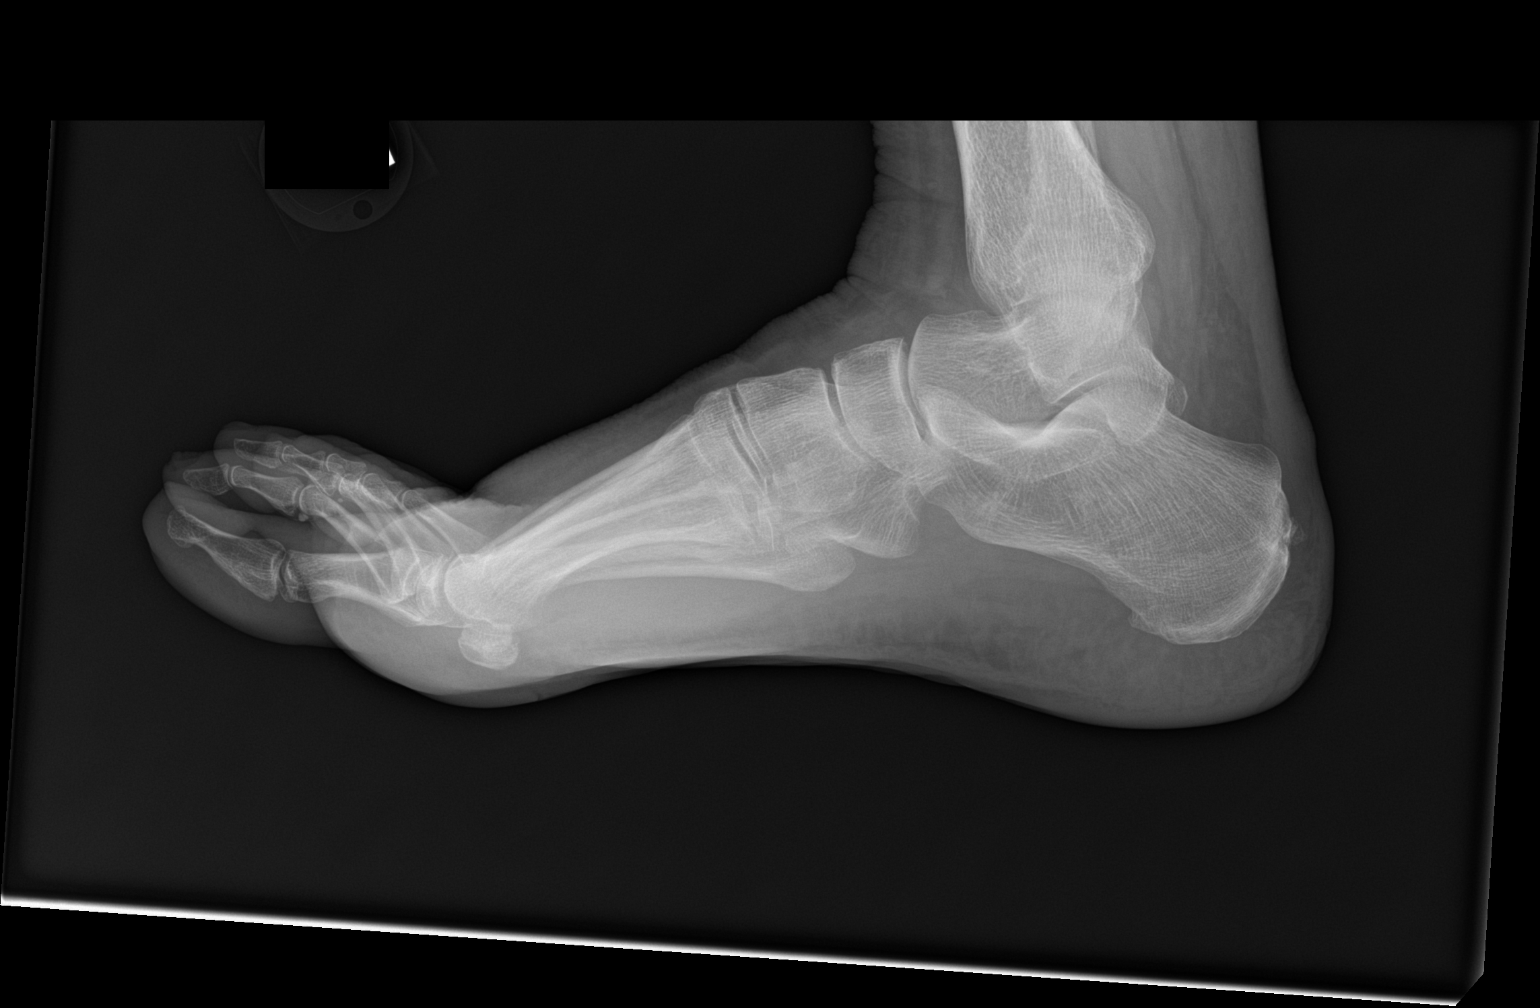

[3 of 3 positions shown; findings below may reference images not displayed]

FINDINGS: No fracture or dislocation is seen.

The joint spaces are preserved.

Visualized soft tissues are within normal limits.
IMPRESSION: Negative.

## 2018-05-29 MED ORDER — IBUPROFEN 800 MG PO TABS: 800.0000 mg | ORAL_TABLET | Freq: Three times a day (TID) | ORAL | 0 refills | Status: DC | PRN

## 2018-05-29 MED ORDER — IBUPROFEN 800 MG PO TABS
800.0000 mg | ORAL_TABLET | Freq: Once | ORAL | Status: AC
  Administered 2018-05-29: 800 mg via ORAL
  Filled 2018-05-29: qty 1

## 2018-05-29 MED ORDER — HYDROCODONE-ACETAMINOPHEN 5-325 MG PO TABS: 1.0000 | ORAL_TABLET | Freq: Four times a day (QID) | ORAL | 0 refills | Status: DC | PRN

## 2018-05-29 MED ORDER — HYDROCODONE-ACETAMINOPHEN 5-325 MG PO TABS
1.0000 | ORAL_TABLET | Freq: Once | ORAL | Status: AC
  Administered 2018-05-29: 1 via ORAL
  Filled 2018-05-29: qty 1

## 2018-05-29 NOTE — ED Provider Notes (Signed)
[facility] Emergency Department Provider Note   ____________________________________________   First MD Initiated Contact with Patient 05/29/18 [phone]     (approximate)  I have reviewed the triage vital signs and the nursing notes.   HISTORY  Chief Complaint Foot Pain    HPI Ann Bowen is a 62 y.o. female who presents to the ED from home with a chief complaint of right foot pain.  Patient states she tripped over her dog 3 nights ago.  Reports pain to her right medial foot associated with mild swelling.  Hurts to ambulate.  Denies striking head or LOC.  Voices no other medical complaints.  Denies use of anticoagulants.   Past Medical History:  Diagnosis Date  . Arthritis   . Borderline personality disorder ([place])   . Depression   . Fibromyalgia   . Shingles     Patient Active Problem List   Diagnosis Date Noted  . History of colonic polyps   . Borderline personality disorder ([place]) 08/10/2015  . MDD (major depressive disorder), recurrent episode ([place]) 08/10/2015  . Fibromyalgia 08/10/2015  . Osteoarthritis 08/10/2015  . Tobacco use disorder 08/10/2015    Past Surgical History:  Procedure Laterality Date  . CHOLECYSTECTOMY    . COLONOSCOPY WITH PROPOFOL N/A 01/01/2018   Procedure: COLONOSCOPY WITH PROPOFOL;  Surgeon: Lin Landsman, MD;  Location: [facility] ENDOSCOPY;  Service: Gastroenterology;  Laterality: N/A;  . GASTRIC BYPASS    . SHOULDER SURGERY      Prior to Admission medications   Medication Sig Start Date End Date Taking? Authorizing Provider  clonazePAM (KLONOPIN) 1 MG tablet Take 1 mg by mouth 2 (two) times daily.    [provider]  DULoxetine (CYMBALTA) 60 MG capsule Take 2 capsules (120 mg total) by mouth daily. Patient not taking: Reported on 01/01/2018 08/12/15   Hildred Priest, MD  HYDROcodone-acetaminophen (NORCO) 5-325 MG tablet Take 1 tablet by mouth every 6 (six) hours as needed for moderate pain.  05/29/18   Paulette Blanch, MD  hydrOXYzine (ATARAX/VISTARIL) 25 MG tablet Take 1 tablet (25 mg total) by mouth 3 (three) times daily as needed for itching. 05/06/18   Hinda Kehr, MD  ibuprofen (ADVIL,MOTRIN) 800 MG tablet Take 1 tablet (800 mg total) by mouth every 8 (eight) hours as needed for moderate pain. 05/29/18   Paulette Blanch, MD  QUEtiapine (SEROQUEL) 300 MG tablet Take by mouth. 03/16/15   [provider]  rOPINIRole (REQUIP) 4 MG tablet Take 1 tablet (4 mg total) by mouth daily after supper. Patient taking differently: Take 2 mg by mouth at bedtime.  08/12/15   Hildred Priest, MD    Allergies Wellbutrin [bupropion]  No family history on file.  Social History Social History   Tobacco Use  . Smoking status: Never Smoker  . Smokeless tobacco: Never Used  Substance Use Topics  . Alcohol use: Yes    Comment: occasionaly  . Drug use: Never    Review of Systems  Constitutional: No fever/chills Eyes: No visual changes. ENT: No sore throat. Cardiovascular: Denies chest pain. Respiratory: Denies shortness of breath. Gastrointestinal: No abdominal pain.  No nausea, no vomiting.  No diarrhea.  No constipation. Genitourinary: Negative for dysuria. Musculoskeletal: Positive for right foot pain.  Negative for back pain. Skin: Negative for rash. Neurological: Negative for headaches, focal weakness or numbness.   ____________________________________________   PHYSICAL EXAM:  VITAL SIGNS: ED Triage Vitals  Enc Vitals Group     BP 05/29/18 0035 Marland Kitchen)  159/91     Pulse Rate 05/29/18 0035 71     Resp 05/29/18 0035 18     Temp 05/29/18 0035 98 F (36.7 C)     Temp Source 05/29/18 0035 Oral     SpO2 05/29/18 0035 98 %     Weight 05/29/18 0019 250 lb (113.4 kg)     Height 05/29/18 0019 5\' 3"  (1.6 m)     Head Circumference --      Peak Flow --      Pain Score 05/29/18 0019 8     Pain Loc --      Pain Edu? --      Excl. in [place]? --     Constitutional:  Alert and oriented. Well appearing and in no acute distress. Eyes: Conjunctivae are normal. PERRL. EOMI. Head: Atraumatic. Nose: No congestion/rhinnorhea. Mouth/Throat: Mucous membranes are moist.  Oropharynx non-erythematous. Neck: No stridor.  No cervical spine tenderness to palpation. Cardiovascular: Normal rate, regular rhythm. Grossly normal heart sounds.  Good peripheral circulation. Respiratory: Normal respiratory effort.  No retractions. Lungs CTAB. Gastrointestinal: Soft and nontender. No distention. No abdominal bruits. No CVA tenderness. Musculoskeletal:  Right foot: Mild swelling to medial aspect which is tender to palpation.  Full range of motion.  2+ distal pulses.  Brisk, less than 5-second capillary refill. Neurologic:  Normal speech and language. No gross focal neurologic deficits are appreciated.  Skin:  Skin is warm, dry and intact. No rash noted. Psychiatric: Mood and affect are normal. Speech and behavior are normal.  ____________________________________________   LABS (all labs ordered are listed, but only abnormal results are displayed)  Labs Reviewed - No data to display ____________________________________________  EKG  None ____________________________________________  RADIOLOGY  ED MD interpretation: No fracture or dislocation  Official radiology report(s): Dg Foot Complete Right  Result Date: 05/29/2018 CLINICAL DATA:  Right foot pain since Friday EXAM: RIGHT FOOT COMPLETE - 3+ VIEW COMPARISON:  None. FINDINGS: No fracture or dislocation is seen. The joint spaces are preserved. Visualized soft tissues are within normal limits. IMPRESSION: Negative. Electronically Signed   By: Julian Hy M.D.   On: 05/29/2018 00:50    ____________________________________________   PROCEDURES  Procedure(s) performed: None  Procedures  Critical Care performed: No  ____________________________________________   INITIAL IMPRESSION / ASSESSMENT AND PLAN  / ED COURSE  As part of my medical decision making, I reviewed the following data within the [place] History obtained from family, Nursing notes reviewed and incorporated, Radiograph reviewed and Notes from prior ED visits   63 year old female who presents with right foot contusion.  X-rays negative for fracture or dislocation.  Will place in podiatric shoe, walker.  Administer Motrin and Norco for pain.  Patient will follow-up with podiatry as needed.  Strict return precautions given.  Patient verbalizes understanding agrees with plan of care.      ____________________________________________   FINAL CLINICAL IMPRESSION(S) / ED DIAGNOSES  Final diagnoses:  Contusion of right foot, initial encounter  Foot pain, right     ED Discharge Orders         Ordered    ibuprofen (ADVIL,MOTRIN) 800 MG tablet  Every 8 hours PRN     05/29/18 0339    HYDROcodone-acetaminophen (NORCO) 5-325 MG tablet  Every 6 hours PRN     05/29/18 7371           Note:  This document was prepared using Dragon voice recognition software and may include unintentional dictation errors.  Paulette Blanch, MD 05/29/18 [phone]

## 2018-05-29 NOTE — ED Notes (Signed)
Pt states that she tripped over her dog and injured her right foot.

## 2018-05-29 NOTE — Discharge Instructions (Addendum)
1.  You may take pain medicines as needed (Motrin/Norco #15). 2.  Wear podiatric shoe and use walker to help you balance as you walk. 3.  Return to the ER for worsening symptoms, persistent vomiting, difficulty breathing or other concerns.

## 2018-05-29 NOTE — ED Triage Notes (Signed)
Patient ambulatory to triage with steady gait, without difficulty or distress noted; pt reports right foot pain since last Friday; st "I think I broke my foot, kicked a chair or something"

## 2018-07-11 ENCOUNTER — Emergency Department: Payer: Medicare Other

## 2018-07-11 ENCOUNTER — Other Ambulatory Visit: Payer: Self-pay

## 2018-07-11 ENCOUNTER — Encounter: Payer: Self-pay | Admitting: Emergency Medicine

## 2018-07-11 ENCOUNTER — Emergency Department
Admission: EM | Admit: 2018-07-11 | Discharge: 2018-07-11 | Disposition: A | Discharge status: Home / Self Care | Payer: Medicare Other | Attending: Emergency Medicine | Admitting: Emergency Medicine

## 2018-07-11 DIAGNOSIS — M25572 Pain in left ankle and joints of left foot: Secondary | ICD-10-CM | POA: Diagnosis present

## 2018-07-11 DIAGNOSIS — Y999 Unspecified external cause status: Secondary | ICD-10-CM | POA: Diagnosis not present

## 2018-07-11 DIAGNOSIS — M7662 Achilles tendinitis, left leg: Secondary | ICD-10-CM | POA: Insufficient documentation

## 2018-07-11 DIAGNOSIS — Z79899 Other long term (current) drug therapy: Secondary | ICD-10-CM | POA: Diagnosis not present

## 2018-07-11 DIAGNOSIS — Y9289 Other specified places as the place of occurrence of the external cause: Secondary | ICD-10-CM | POA: Diagnosis not present

## 2018-07-11 DIAGNOSIS — Y9389 Activity, other specified: Secondary | ICD-10-CM | POA: Insufficient documentation

## 2018-07-11 DIAGNOSIS — W000XXA Fall on same level due to ice and snow, initial encounter: Secondary | ICD-10-CM | POA: Diagnosis not present

## 2018-07-11 DIAGNOSIS — S93492A Sprain of other ligament of left ankle, initial encounter: Secondary | ICD-10-CM

## 2018-07-11 IMAGING — CR DG ANKLE COMPLETE 3+V*L*
1 series · 3 of 3 positions shown · non-contrast
Comparison: None.

CLINICAL DATA: Left ankle pain after slipping on ice

EXAM:
LEFT ANKLE COMPLETE - 3+ VIEW

[Series 1: dg ankle complete left · 0.14mm/px · 3 of 3 slices shown]
[im 1/3]
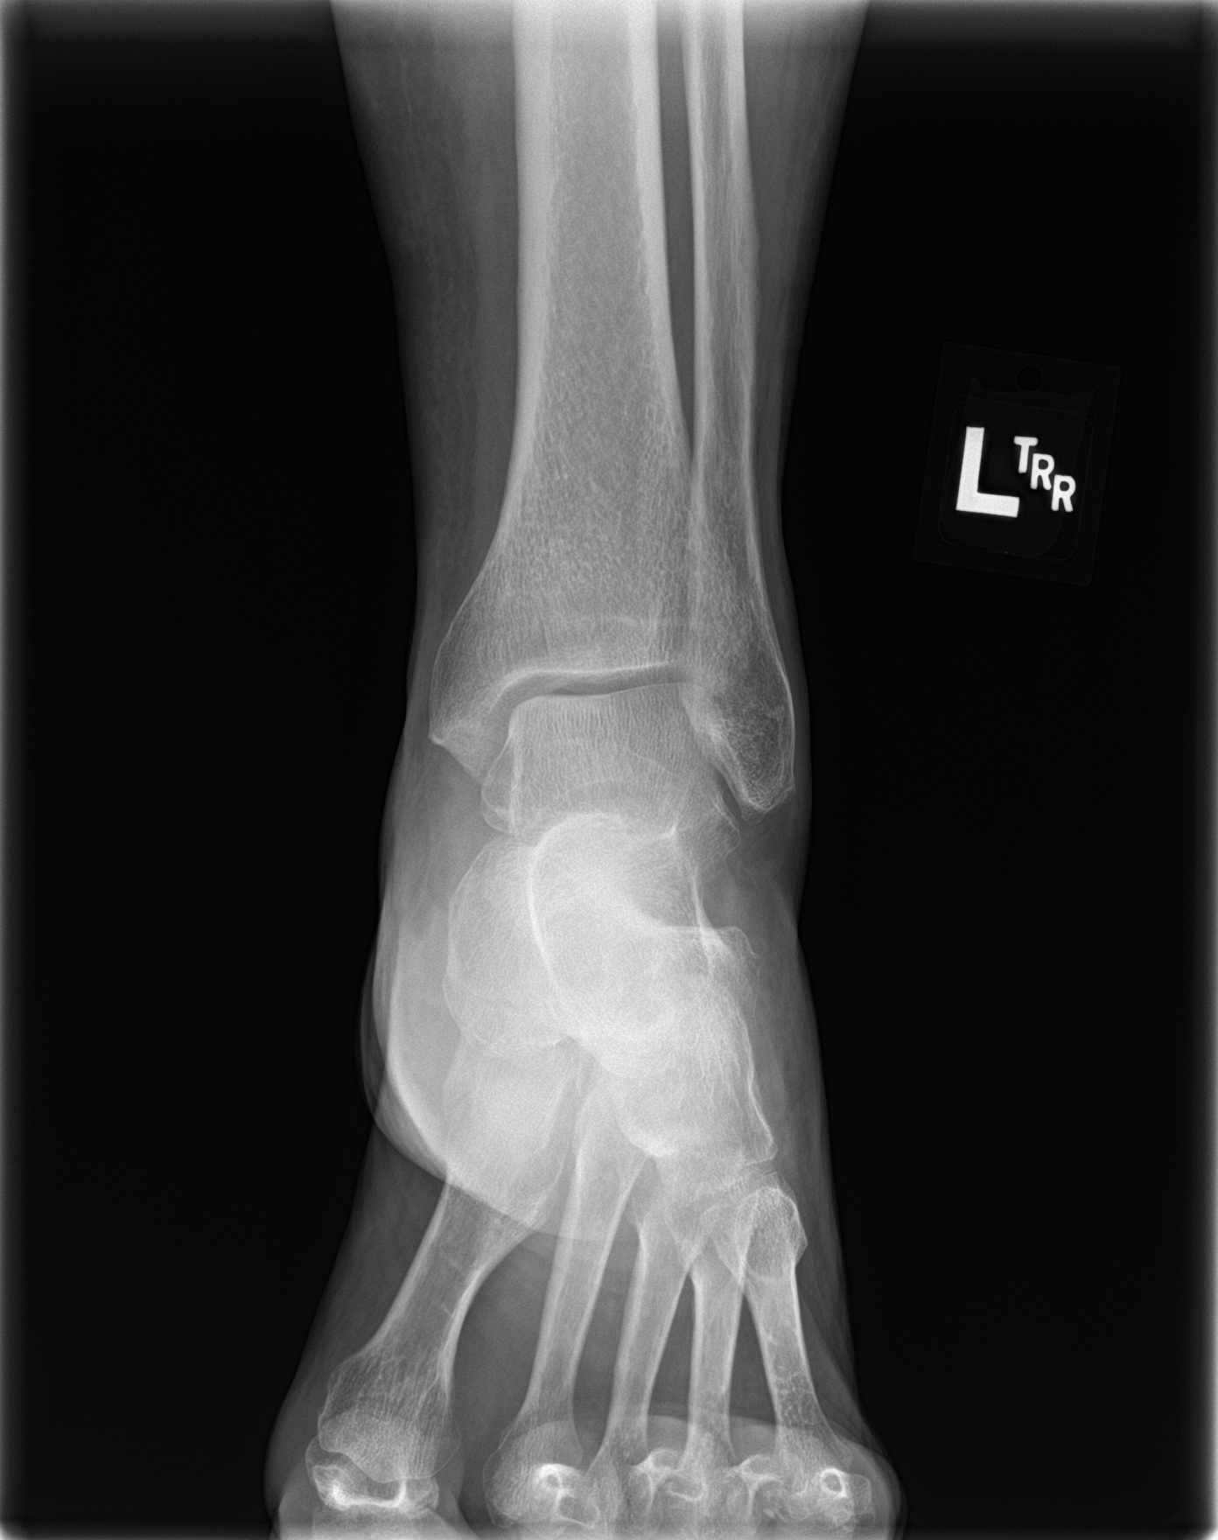
[im 2/3]
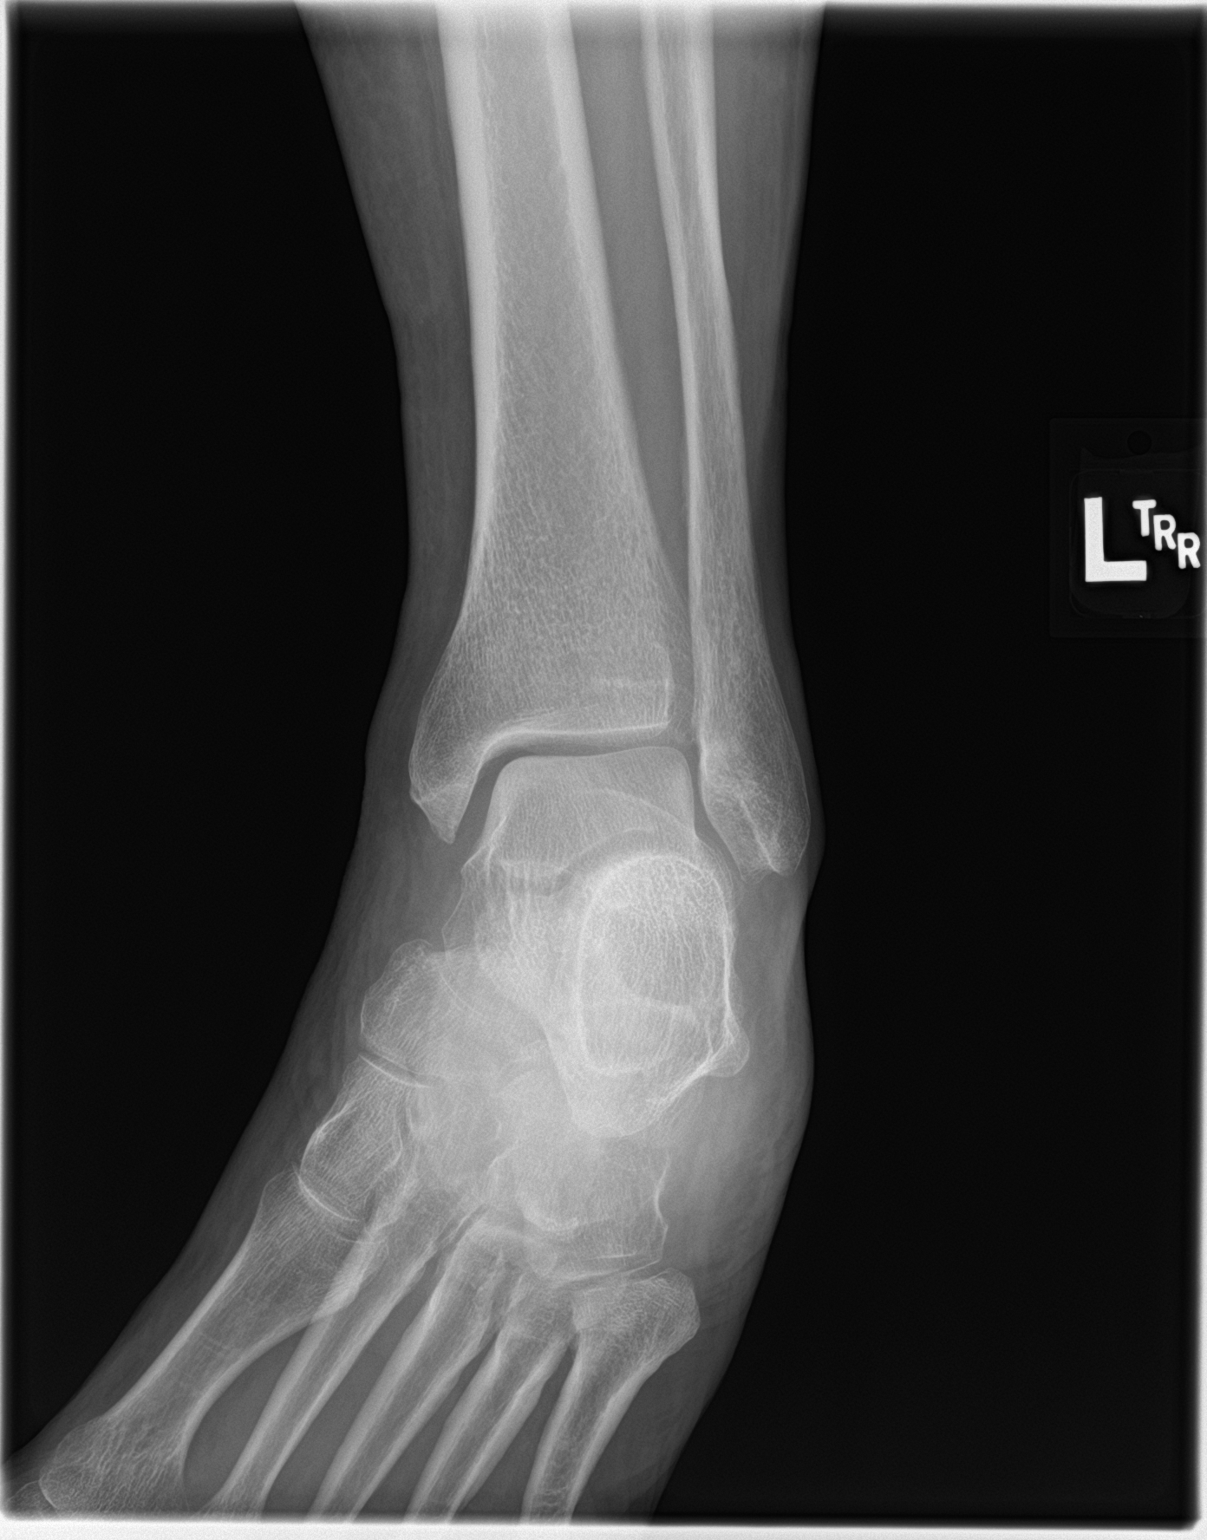
[im 3/3]
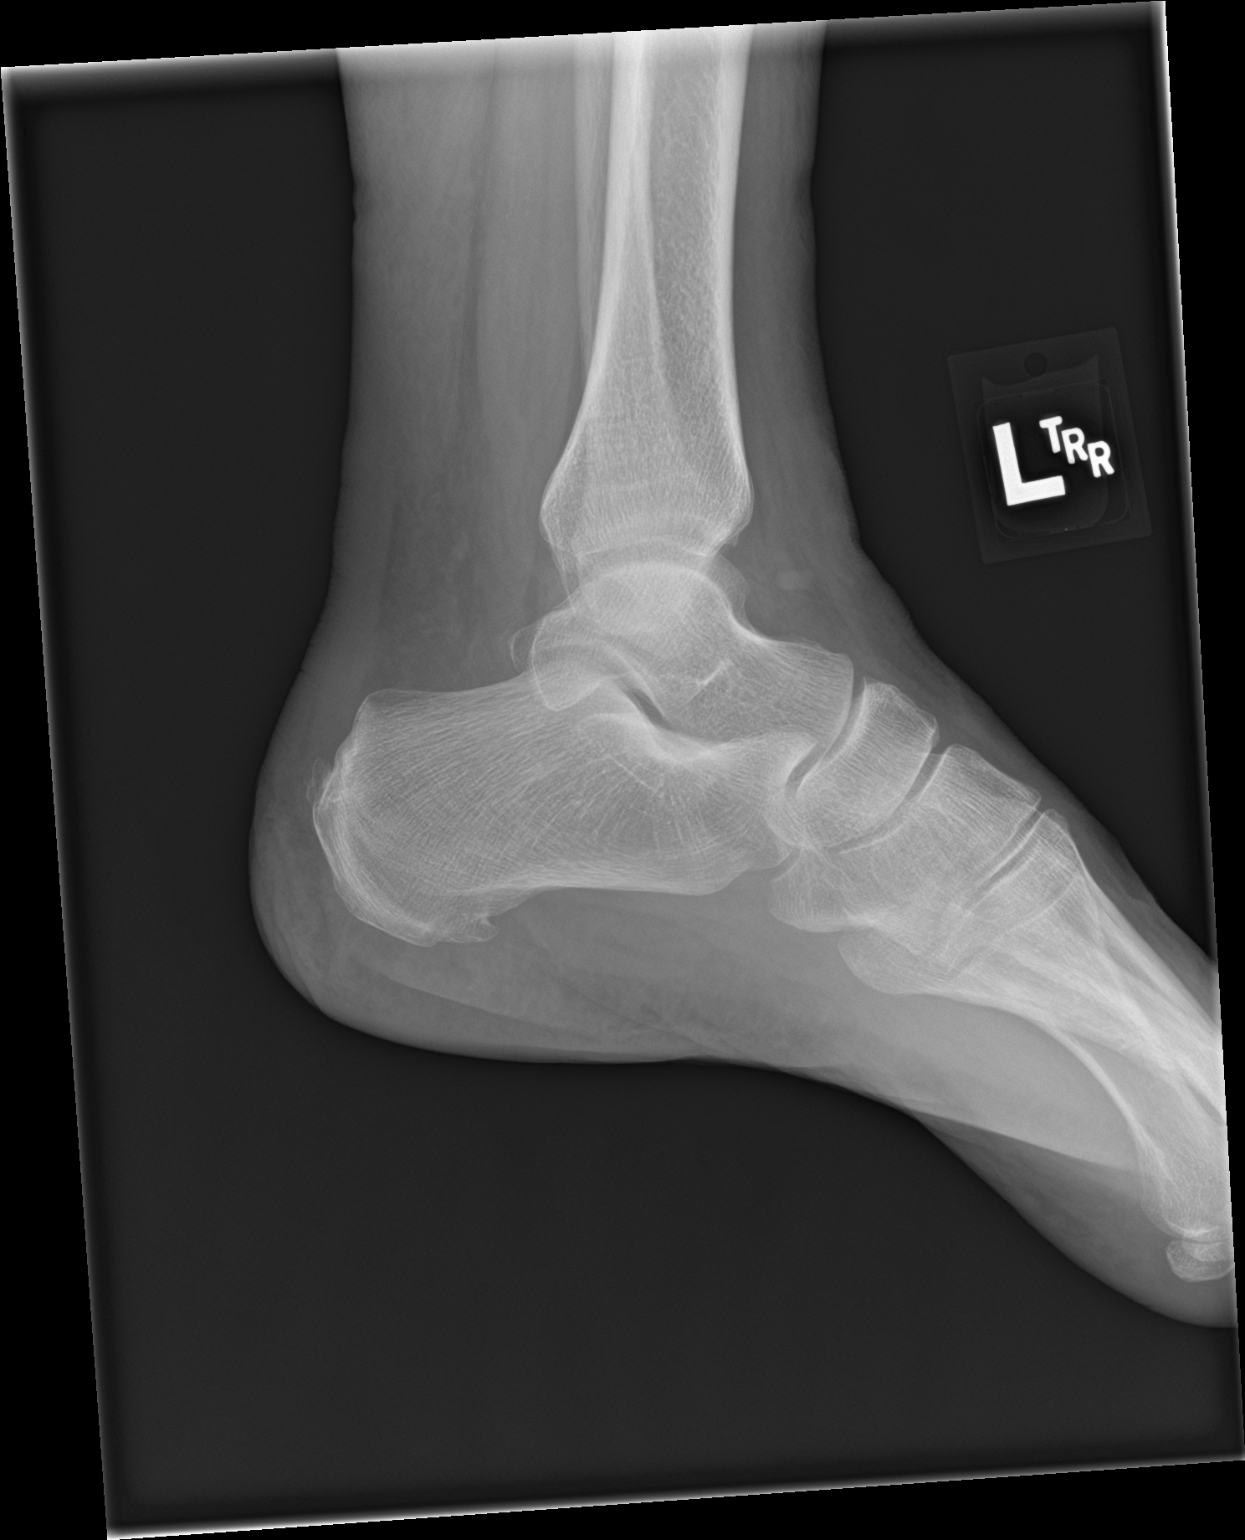

[3 of 3 positions shown; findings below may reference images not displayed]

FINDINGS: There is no evidence of fracture, dislocation, or joint effusion.
There is no evidence of arthropathy or other focal bone abnormality.
Soft tissues are unremarkable.
IMPRESSION: Negative.

## 2018-07-11 MED ORDER — HYDROCODONE-ACETAMINOPHEN 5-325 MG PO TABS
1.0000 | ORAL_TABLET | Freq: Once | ORAL | Status: AC
  Administered 2018-07-11: 1 via ORAL
  Filled 2018-07-11: qty 1

## 2018-07-11 MED ORDER — MELOXICAM 15 MG PO TABS: 15.0000 mg | ORAL_TABLET | Freq: Every day | ORAL | 0 refills | Status: DC

## 2018-07-11 NOTE — ED Provider Notes (Signed)
[facility] Emergency Department Provider Note  ____________________________________________  Time seen: Approximately 8:45 PM  I have reviewed the triage vital signs and the nursing notes.   HISTORY  Chief Complaint Ankle Pain    HPI Ann Bowen is a 63 y.o. female who presents the emergency department complaining of posterior left ankle pain.  Patient patient reports that a week ago she was out in the snow trying to take pictures when she slipped and fell.  Patient has had ongoing posterior ankle pain since injury.  She is able to ambulate without difficulty.  No other injury or complaint.  No medications prior to arrival.    Past Medical History:  Diagnosis Date  . Arthritis   . Borderline personality disorder ([place])   . Depression   . Fibromyalgia   . Shingles     Patient Active Problem List   Diagnosis Date Noted  . History of colonic polyps   . Borderline personality disorder ([place]) 08/10/2015  . MDD (major depressive disorder), recurrent episode ([place]) 08/10/2015  . Fibromyalgia 08/10/2015  . Osteoarthritis 08/10/2015  . Tobacco use disorder 08/10/2015    Past Surgical History:  Procedure Laterality Date  . CHOLECYSTECTOMY    . COLONOSCOPY WITH PROPOFOL N/A 01/01/2018   Procedure: COLONOSCOPY WITH PROPOFOL;  Surgeon: Lin Landsman, MD;  Location: [facility] ENDOSCOPY;  Service: Gastroenterology;  Laterality: N/A;  . GASTRIC BYPASS    . SHOULDER SURGERY      Prior to Admission medications   Medication Sig Start Date End Date Taking? Authorizing Provider  clonazePAM (KLONOPIN) 1 MG tablet Take 1 mg by mouth 2 (two) times daily.    [provider]  DULoxetine (CYMBALTA) 60 MG capsule Take 2 capsules (120 mg total) by mouth daily. Patient not taking: Reported on 01/01/2018 08/12/15   Hildred Priest, MD  HYDROcodone-acetaminophen (NORCO) 5-325 MG tablet Take 1 tablet by mouth every 6 (six) hours as needed for moderate pain.  05/29/18   Paulette Blanch, MD  hydrOXYzine (ATARAX/VISTARIL) 25 MG tablet Take 1 tablet (25 mg total) by mouth 3 (three) times daily as needed for itching. 05/06/18   Hinda Kehr, MD  ibuprofen (ADVIL,MOTRIN) 800 MG tablet Take 1 tablet (800 mg total) by mouth every 8 (eight) hours as needed for moderate pain. 05/29/18   Paulette Blanch, MD  meloxicam (MOBIC) 15 MG tablet Take 1 tablet (15 mg total) by mouth daily. 07/11/18   Bradrick Kamau, Charline Bills, PA-C  QUEtiapine (SEROQUEL) 300 MG tablet Take by mouth. 03/16/15   [provider]  rOPINIRole (REQUIP) 4 MG tablet Take 1 tablet (4 mg total) by mouth daily after supper. Patient taking differently: Take 2 mg by mouth at bedtime.  08/12/15   Hildred Priest, MD    Allergies Wellbutrin [bupropion]  No family history on file.  Social History Social History   Tobacco Use  . Smoking status: Never Smoker  . Smokeless tobacco: Never Used  Substance Use Topics  . Alcohol use: Yes    Comment: occasionaly  . Drug use: Never     Review of Systems  Constitutional: No fever/chills Eyes: No visual changes.  Cardiovascular: no chest pain. Respiratory: no cough. No SOB. Gastrointestinal: No abdominal pain.  No nausea, no vomiting.   Musculoskeletal: Positive for posterior left ankle pain Skin: Negative for rash, abrasions, lacerations, ecchymosis. Neurological: Negative for headaches, focal weakness or numbness. 10-point ROS otherwise negative.  ____________________________________________   PHYSICAL EXAM:  VITAL SIGNS: ED Triage Vitals  Enc  Vitals Group     BP 07/11/18 2002 135/72     Pulse Rate 07/11/18 2002 77     Resp 07/11/18 2002 18     Temp 07/11/18 2002 97.7 F (36.5 C)     Temp src --      SpO2 07/11/18 2002 97 %     Weight 07/11/18 2001 215 lb (97.5 kg)     Height 07/11/18 2001 5\' 3"  (1.6 m)     Head Circumference --      Peak Flow --      Pain Score 07/11/18 2001 9     Pain Loc --      Pain Edu? --       Excl. in [place]? --      Constitutional: Alert and oriented. Well appearing and in no acute distress. Eyes: Conjunctivae are normal. PERRL. EOMI. Head: Atraumatic. Neck: No stridor.    Cardiovascular: Normal rate, regular rhythm. Normal S1 and S2.  Good peripheral circulation. Respiratory: Normal respiratory effort without tachypnea or retractions. Lungs CTAB. Good air entry to the bases with no decreased or absent breath sounds. Musculoskeletal: Full range of motion to all extremities. No gross deformities appreciated.  Visualization of the left ankle reveals no gross signs of trauma.  No edema, ecchymosis, abrasions or lacerations.  Full range of motion to the ankle.  Patient is tender to palpation along the Achilles tendon with no palpable deficit.  Dorsalis pedis pulse intact.  Sensation intact all digits. Neurologic:  Normal speech and language. No gross focal neurologic deficits are appreciated.  Skin:  Skin is warm, dry and intact. No rash noted. Psychiatric: Mood and affect are normal. Speech and behavior are normal. Patient exhibits appropriate insight and judgement.   ____________________________________________   LABS (all labs ordered are listed, but only abnormal results are displayed)  Labs Reviewed - No data to display ____________________________________________  EKG   ____________________________________________  RADIOLOGY I personally viewed and evaluated these images as part of my medical decision making, as well as reviewing the written report by the radiologist.  I concur with radiologist finding of no acute osseous abnormality of left ankle.  Dg Ankle Complete Left  Result Date: 07/11/2018 CLINICAL DATA:  Left ankle pain after slipping on ice EXAM: LEFT ANKLE COMPLETE - 3+ VIEW COMPARISON:  None. FINDINGS: There is no evidence of fracture, dislocation, or joint effusion. There is no evidence of arthropathy or other focal bone abnormality. Soft tissues are  unremarkable. IMPRESSION: Negative. Electronically Signed   By: Ulyses Jarred M.D.   On: 07/11/2018 20:27    ____________________________________________    PROCEDURES  Procedure(s) performed:    Procedures    Medications - No data to display   ____________________________________________   INITIAL IMPRESSION / ASSESSMENT AND PLAN / ED COURSE  Pertinent labs & imaging results that were available during my care of the patient were reviewed by me and considered in my medical decision making (see chart for details).  Review of the [place] CSRS was performed in accordance of the [place] prior to dispensing any controlled drugs.      Patient's diagnosis is consistent with Achilles tendinitis after left ankle sprain.  Patient presents emergency department complaining of posterior left ankle pain after a fall.  X-ray reveals no acute osseous abnormality.  Exam was otherwise reassuring.  Splint applied.  Follow-up with orthopedics. Patient is given ED precautions to return to the ED for any worsening or new symptoms.     ____________________________________________  FINAL  CLINICAL IMPRESSION(S) / ED DIAGNOSES  Final diagnoses:  Achilles tendinitis of left lower extremity  Sprain of other ligament of left ankle, initial encounter      NEW MEDICATIONS STARTED DURING THIS VISIT:  ED Discharge Orders         Ordered    meloxicam (MOBIC) 15 MG tablet  Daily     07/11/18 2100              This chart was dictated using voice recognition software/Dragon. Despite best efforts to proofread, errors can occur which can change the meaning. Any change was purely unintentional.    Brynda Peon 07/11/18 2103    Nance Pear, MD 07/11/18 [phone]

## 2018-07-11 NOTE — ED Triage Notes (Signed)
Pt to triage via w/c with no distress noted; reports injuring left ankle after slipping on ice last wk

## 2018-12-16 ENCOUNTER — Emergency Department
Admission: EM | Admit: 2018-12-16 | Discharge: 2018-12-16 | Discharge status: Against Medical Advice | Payer: Medicare Other

## 2019-01-05 ENCOUNTER — Emergency Department
Admission: EM | Admit: 2019-01-05 | Discharge: 2019-01-05 | Disposition: A | Discharge status: Home / Self Care | Payer: Medicare Other | Attending: Emergency Medicine | Admitting: Emergency Medicine

## 2019-01-05 ENCOUNTER — Encounter: Payer: Self-pay | Admitting: Emergency Medicine

## 2019-01-05 ENCOUNTER — Other Ambulatory Visit: Payer: Self-pay

## 2019-01-05 ENCOUNTER — Emergency Department: Payer: Medicare Other

## 2019-01-05 DIAGNOSIS — M25572 Pain in left ankle and joints of left foot: Secondary | ICD-10-CM | POA: Diagnosis present

## 2019-01-05 DIAGNOSIS — M7662 Achilles tendinitis, left leg: Secondary | ICD-10-CM | POA: Insufficient documentation

## 2019-01-05 DIAGNOSIS — Z79899 Other long term (current) drug therapy: Secondary | ICD-10-CM | POA: Diagnosis not present

## 2019-01-05 IMAGING — CR LEFT ANKLE COMPLETE - 3+ VIEW
3 series · 3 of 3 positions shown · non-contrast
Comparison: Radiograph [DATE]

CLINICAL DATA: Ankle pain. No known injury.

EXAM:
LEFT ANKLE COMPLETE - 3+ VIEW

[ankle ap]
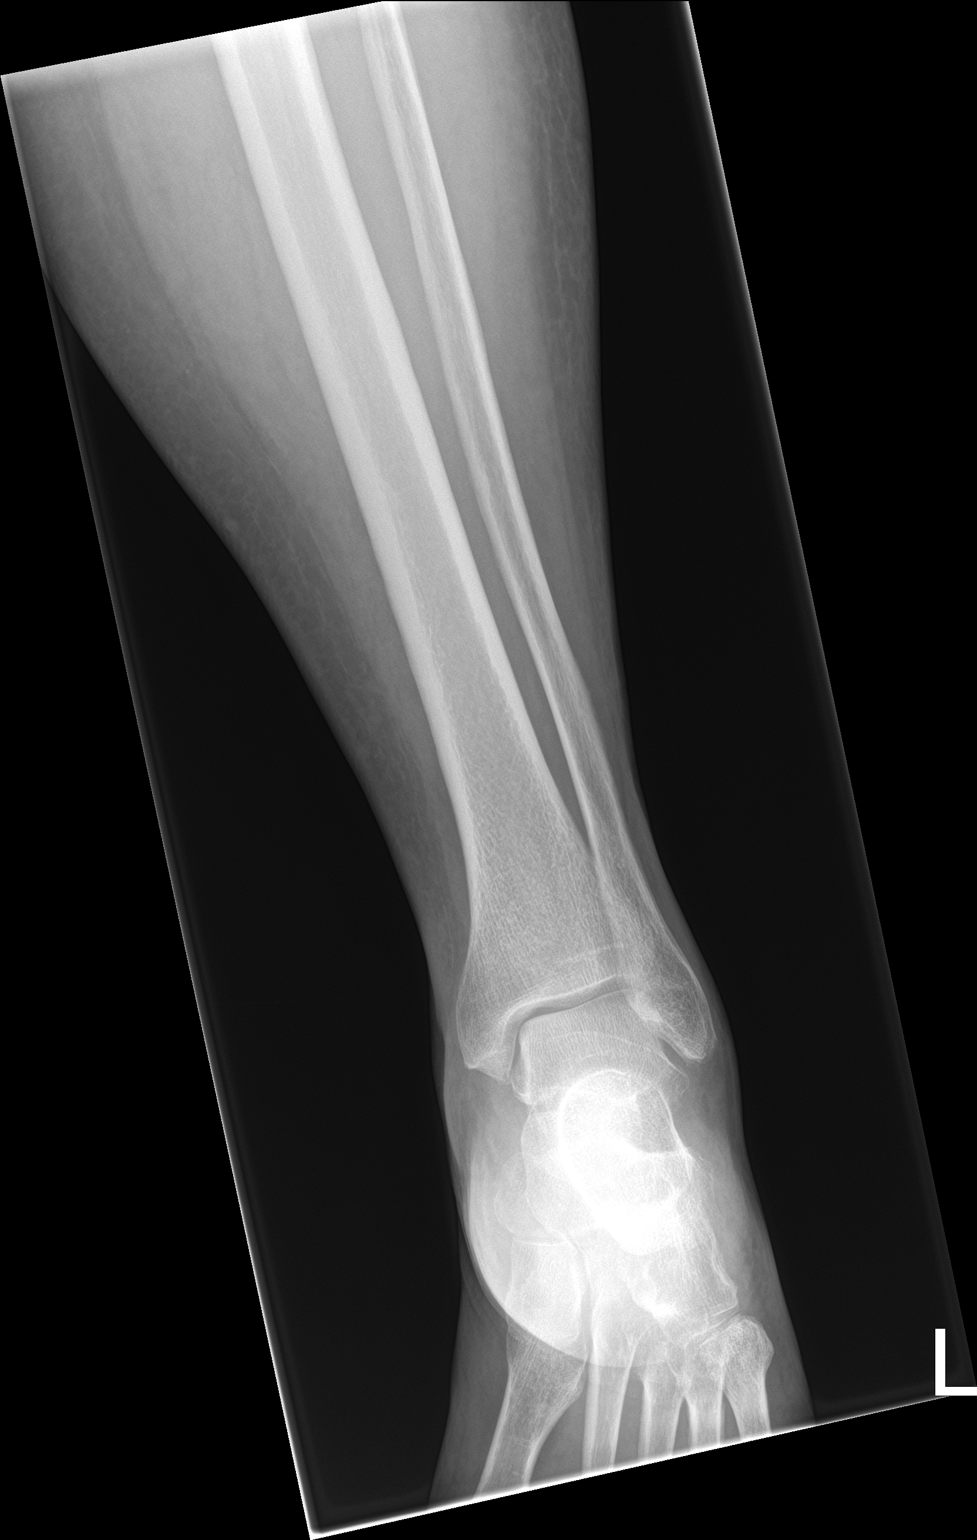

[ankle obl]
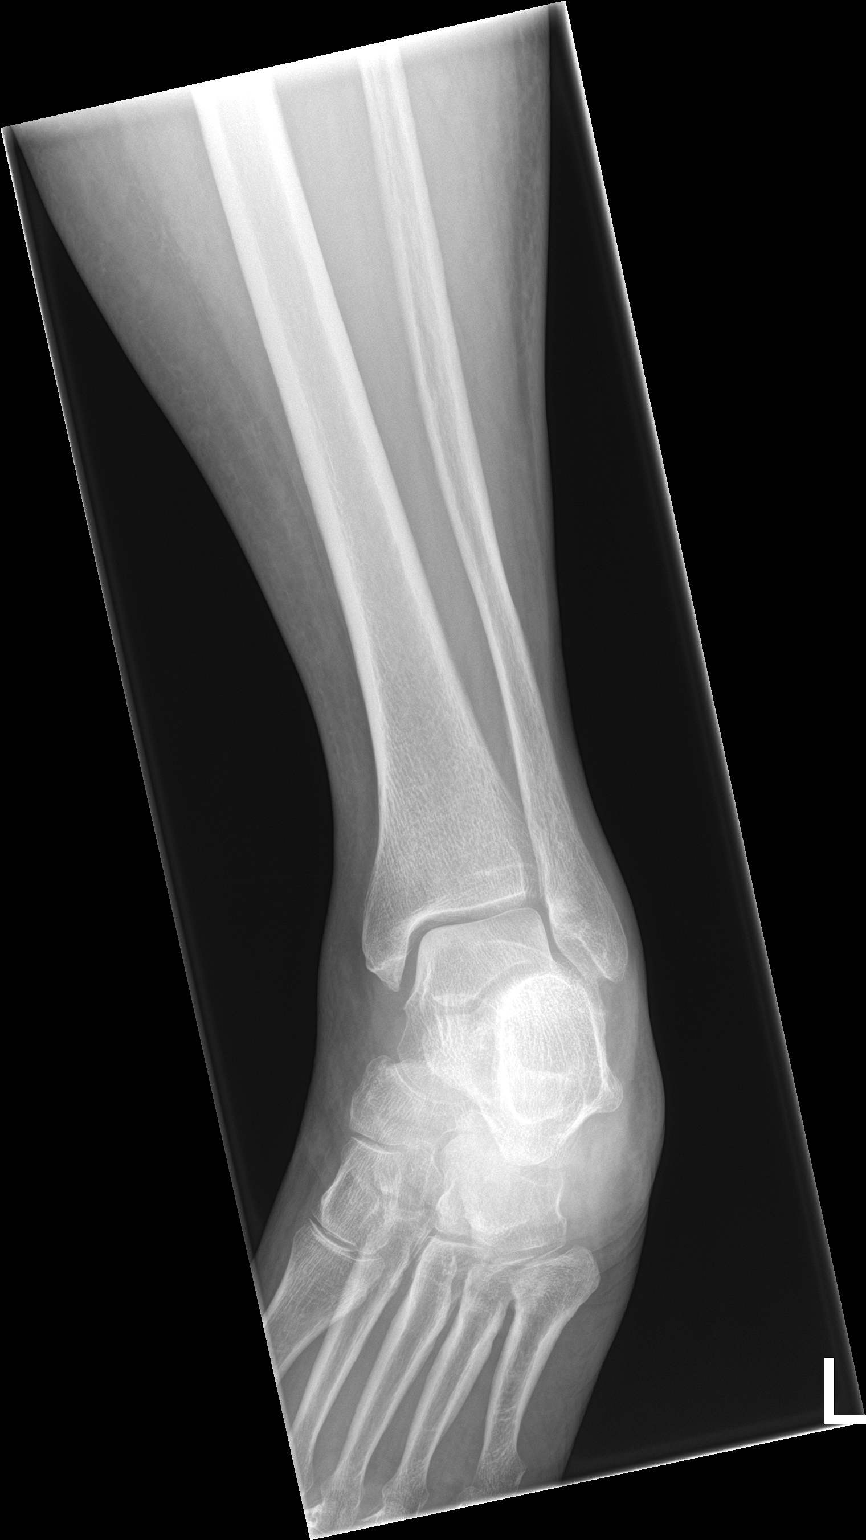

[ankle lat]
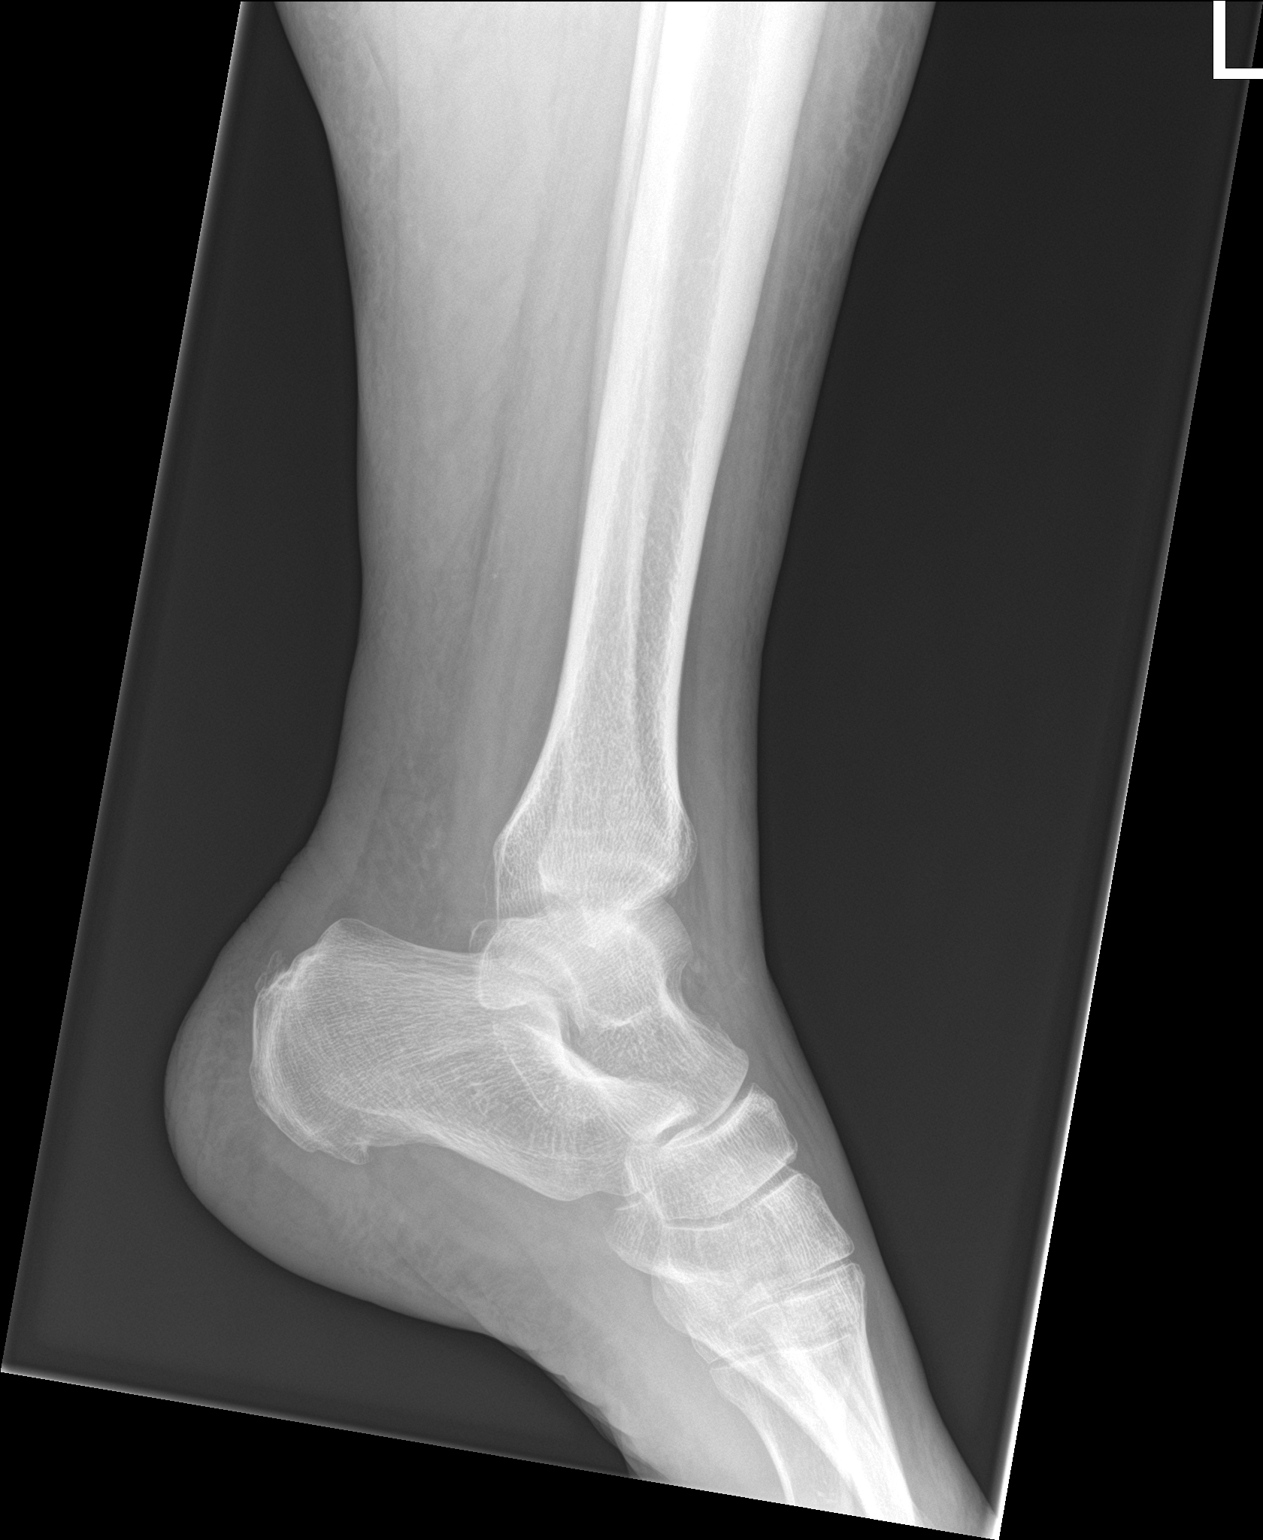

[3 of 3 positions shown; findings below may reference images not displayed]

FINDINGS: There is no evidence of fracture, dislocation, or joint effusion.
Small plantar calcaneal spur and Achilles tendon enthesophyte,
similar to prior exam. There is no evidence of arthropathy or other
focal bone abnormality. Mild generalized soft tissue edema versus
habitus.
IMPRESSION: No acute osseous abnormality. Small plantar calcaneal spur and
Achilles tendon enthesophyte.

## 2019-01-05 MED ORDER — HYDROCODONE-ACETAMINOPHEN 5-325 MG PO TABS: 1.0000 | ORAL_TABLET | Freq: Three times a day (TID) | ORAL | 0 refills | Status: DC | PRN

## 2019-01-05 MED ORDER — DEXAMETHASONE SODIUM PHOSPHATE 10 MG/ML IJ SOLN
10.0000 mg | Freq: Once | INTRAMUSCULAR | Status: AC
  Administered 2019-01-05: 23:00:00 10 mg via INTRAMUSCULAR
  Filled 2019-01-05: qty 1

## 2019-01-05 MED ORDER — PREDNISONE 10 MG PO TABS: ORAL_TABLET | ORAL | 0 refills | Status: DC

## 2019-01-05 MED ORDER — HYDROCODONE-ACETAMINOPHEN 5-325 MG PO TABS
1.0000 | ORAL_TABLET | Freq: Once | ORAL | Status: AC
  Administered 2019-01-05: 1 via ORAL
  Filled 2019-01-05: qty 1

## 2019-01-05 NOTE — Discharge Instructions (Addendum)
You were seen today for left ankle pain.  Your x-ray shows a calcaneal bone spur.  You likely have Achilles tendinitis.  We have given you a steroid injection and provided you with a prescription for steroids for the next 9 days.  Please avoid anti-inflammatories over-the-counter.  We have given you a prescription for small amount of pain medication, be aware that this may cause sedation.  Please follow-up with your PCP if symptoms persist or worsen.

## 2019-01-05 NOTE — ED Triage Notes (Signed)
Pt presents to ED c/o pain to achilles tendon and heel starting today. States unable to put weight on foot. Denies known injury. Was moving bricks and cinder blocks yesterday.

## 2019-01-05 NOTE — ED Provider Notes (Signed)
[facility] Emergency Department Provider Note ____________________________________________  Time seen: 2300  I have reviewed the triage vital signs and the nursing notes.  HISTORY  Chief Complaint  Foot Pain   HPI Ann Bowen is a 63 y.o. female presents to the ER today with complaint of left ankle pain.  She reports the pain started earlier today.  She describes the pain as sharp and stabbing.  The pain does not radiate.  She denies numbness, tingling or weakness of her left lower extremity.  She has not noticed any swelling or redness.  She denies any injury to the area but was moving bricks and cinderblocks yesterday.  She has taken Ibuprofen and Gabapentin with no relief.  Past Medical History:  Diagnosis Date  . Arthritis   . Borderline personality disorder ([place])   . Depression   . Fibromyalgia   . Shingles     Patient Active Problem List   Diagnosis Date Noted  . History of colonic polyps   . Borderline personality disorder ([place]) 08/10/2015  . MDD (major depressive disorder), recurrent episode ([place]) 08/10/2015  . Fibromyalgia 08/10/2015  . Osteoarthritis 08/10/2015  . Tobacco use disorder 08/10/2015    Past Surgical History:  Procedure Laterality Date  . CHOLECYSTECTOMY    . COLONOSCOPY WITH PROPOFOL N/A 01/01/2018   Procedure: COLONOSCOPY WITH PROPOFOL;  Surgeon: Lin Landsman, MD;  Location: [facility] ENDOSCOPY;  Service: Gastroenterology;  Laterality: N/A;  . GASTRIC BYPASS    . SHOULDER SURGERY      Prior to Admission medications   Medication Sig Start Date End Date Taking? Authorizing Provider  clonazePAM (KLONOPIN) 1 MG tablet Take 1 mg by mouth 2 (two) times daily.    [provider]  DULoxetine (CYMBALTA) 60 MG capsule Take 2 capsules (120 mg total) by mouth daily. Patient not taking: Reported on 01/01/2018 08/12/15   Hildred Priest, MD  HYDROcodone-acetaminophen (NORCO/VICODIN) 5-325 MG tablet Take 1 tablet by  mouth every 8 (eight) hours as needed for moderate pain. 01/05/19 01/05/20  Jearld Fenton, NP  hydrOXYzine (ATARAX/VISTARIL) 25 MG tablet Take 1 tablet (25 mg total) by mouth 3 (three) times daily as needed for itching. 05/06/18   Hinda Kehr, MD  ibuprofen (ADVIL,MOTRIN) 800 MG tablet Take 1 tablet (800 mg total) by mouth every 8 (eight) hours as needed for moderate pain. 05/29/18   Paulette Blanch, MD  meloxicam (MOBIC) 15 MG tablet Take 1 tablet (15 mg total) by mouth daily. 07/11/18   Cuthriell, Charline Bills, PA-C  predniSONE (DELTASONE) 10 MG tablet Take 3 tabs on days 1-3, take 2 tabs on days 4-6, take 1 tab on days 7-9 01/05/19   Jearld Fenton, NP  QUEtiapine (SEROQUEL) 300 MG tablet Take by mouth. 03/16/15   [provider]  rOPINIRole (REQUIP) 4 MG tablet Take 1 tablet (4 mg total) by mouth daily after supper. Patient taking differently: Take 2 mg by mouth at bedtime.  08/12/15   Hildred Priest, MD    Allergies Wellbutrin [bupropion]  History reviewed. No pertinent family history.  Social History Social History   Tobacco Use  . Smoking status: Never Smoker  . Smokeless tobacco: Never Used  Substance Use Topics  . Alcohol use: Yes    Comment: occasionaly  . Drug use: Never    Review of Systems  Constitutional: Negative for fever. Cardiovascular: Negative for chest pain or chest tightness. Respiratory: Negative for cough or shortness of breath. Musculoskeletal: Positive for left ankle pain, difficulty  with gait. Skin: Negative for redness. Neurological: Negative for focal weakness, tingling or numbness. ____________________________________________  PHYSICAL EXAM:  VITAL SIGNS: ED Triage Vitals  Enc Vitals Group     BP 01/05/19 2141 140/89     Pulse Rate 01/05/19 2141 95     Resp 01/05/19 2141 18     Temp 01/05/19 2141 98.2 F (36.8 C)     Temp Source 01/05/19 2141 Oral     SpO2 01/05/19 2141 95 %     Weight 01/05/19 2141 233 lb (105.7 kg)      Height 01/05/19 2141 5\' 3"  (1.6 m)     Head Circumference --      Peak Flow --      Pain Score 01/05/19 2143 5     Pain Loc --      Pain Edu? --      Excl. in [place]? --     Constitutional: Alert and oriented. Well appearing and in no distress. Cardiovascular: Normal rate, regular rhythm.  Pedal pulses 2+ bilaterally Respiratory: Normal respiratory effort. No wheezes/rales/rhonchi. Musculoskeletal: Pain with flexion, extension and rotation of the left ankle.  No pain with palpation over the malleoli.  Pain with palpation of the left Achilles tendon.  No swelling or redness noted over the left Achilles tendon.  Strength 5/5 BLE.  Gait not visualized. Neurologic:  Normal gait without ataxia. Normal speech and language. No gross focal neurologic deficits are appreciated. Skin:  Skin is warm, dry and intact. No redness or warmth noted. ____________________________________________   RADIOLOGY   Imaging Orders     DG Ankle Complete Left  IMPRESSION:  No acute osseous abnormality. Small plantar calcaneal spur and  Achilles tendon enthesophyte.    ____________________________________________   INITIAL IMPRESSION / ASSESSMENT AND PLAN / ED COURSE  Left Ankle Pain, Achilles Tendonitis:  Xray shows no acute findings Decadron 10 mg IM x 1 Norco 5-325 mg tab PO x 1 RX for Pred Taper x 9 days RX for Norco 5-325 mg Q8Hprn Follow up with PCP if pain persist or worsens     I reviewed the patient's prescription history over the last 12 months in the multi-state controlled substances database(s) that includes [place], [place], [place], [place], [place], [place], [place], [place], [place], [place], [place], [place], [place], and [place].  Results were notable for Tylenol # 3, 8 tabs 10/2018. ____________________________________________  FINAL CLINICAL IMPRESSION(S) / ED DIAGNOSES  Final diagnoses:  Acute left ankle pain  Achilles tendinitis of left  lower extremity   Webb Silversmith, NP    Jearld Fenton, NP 01/05/19 [id number]    Arta Silence, MD 01/06/19 [phone]

## 2019-08-29 ENCOUNTER — Emergency Department
Admission: EM | Admit: 2019-08-29 | Discharge: 2019-08-29 | Disposition: A | Discharge status: Home / Self Care | Payer: Medicare Other | Attending: Emergency Medicine | Admitting: Emergency Medicine

## 2019-08-29 ENCOUNTER — Other Ambulatory Visit: Payer: Self-pay

## 2019-08-29 DIAGNOSIS — T7840XA Allergy, unspecified, initial encounter: Secondary | ICD-10-CM | POA: Diagnosis present

## 2019-08-29 LAB — CBC WITH DIFFERENTIAL/PLATELET
Abs Immature Granulocytes: 0.08 10*3/uL — ABNORMAL HIGH (ref 0.00–0.07)
Basophils Absolute: 0 10*3/uL (ref 0.0–0.1)
Basophils Relative: 0 %
Eosinophils Absolute: 0.1 10*3/uL (ref 0.0–0.5)
Eosinophils Relative: 1 %
HCT: 50 % — ABNORMAL HIGH (ref 36.0–46.0)
Hemoglobin: 16.2 g/dL — ABNORMAL HIGH (ref 12.0–15.0)
Immature Granulocytes: 1 %
Lymphocytes Relative: 32 %
Lymphs Abs: 3.9 10*3/uL (ref 0.7–4.0)
MCH: 28.3 pg (ref 26.0–34.0)
MCHC: 32.4 g/dL (ref 30.0–36.0)
MCV: 87.3 fL (ref 80.0–100.0)
Monocytes Absolute: 0.6 10*3/uL (ref 0.1–1.0)
Monocytes Relative: 5 %
Neutro Abs: 7.5 10*3/uL (ref 1.7–7.7)
Neutrophils Relative %: 61 %
Platelets: 379 10*3/uL (ref 150–400)
RBC: 5.73 MIL/uL — ABNORMAL HIGH (ref 3.87–5.11)
RDW: 14 % (ref 11.5–15.5)
WBC: 12.1 10*3/uL — ABNORMAL HIGH (ref 4.0–10.5)
nRBC: 0 % (ref 0.0–0.2)

## 2019-08-29 LAB — BASIC METABOLIC PANEL
Anion gap: 15 (ref 5–15)
BUN: 11 mg/dL (ref 8–23)
CO2: 23 mmol/L (ref 22–32)
Calcium: 9.3 mg/dL (ref 8.9–10.3)
Chloride: 101 mmol/L (ref 98–111)
Creatinine, Ser: 1.15 mg/dL — ABNORMAL HIGH (ref 0.44–1.00)
GFR calc Af Amer: 58 mL/min — ABNORMAL LOW (ref >60)
GFR calc non Af Amer: 50 mL/min — ABNORMAL LOW (ref >60)
Glucose, Bld: 117 mg/dL — ABNORMAL HIGH (ref 70–99)
Potassium: 3.2 mmol/L — ABNORMAL LOW (ref 3.5–5.1)
Sodium: 139 mmol/L (ref 135–145)

## 2019-08-29 MED ORDER — FAMOTIDINE IN NACL 20-0.9 MG/50ML-% IV SOLN
20.0000 mg | Freq: Once | INTRAVENOUS | Status: AC
  Administered 2019-08-29: 20 mg via INTRAVENOUS
  Filled 2019-08-29: qty 50

## 2019-08-29 MED ORDER — METHYLPREDNISOLONE SODIUM SUCC 125 MG IJ SOLR
125.0000 mg | Freq: Once | INTRAMUSCULAR | Status: AC
  Administered 2019-08-29: 125 mg via INTRAVENOUS
  Filled 2019-08-29: qty 2

## 2019-08-29 MED ORDER — LACTATED RINGERS IV BOLUS
1000.0000 mL | Freq: Once | INTRAVENOUS | Status: AC
  Administered 2019-08-29: 1000 mL via INTRAVENOUS

## 2019-08-29 MED ORDER — DIPHENHYDRAMINE HCL 50 MG/ML IJ SOLN
50.0000 mg | Freq: Once | INTRAMUSCULAR | Status: AC
  Administered 2019-08-29: 50 mg via INTRAVENOUS
  Filled 2019-08-29: qty 1

## 2019-08-29 NOTE — ED Notes (Signed)
Pt disconnected from monitors to use restroom. Pt maintained steady gait. Pt reports decrease in itching at this time

## 2019-08-29 NOTE — ED Triage Notes (Signed)
Pt states she had the covid vaccine on Tuesday states she was fine this morning , ate breakfast and then sudden felt dizzy and faint and began breaking out in a rash all over.

## 2019-08-29 NOTE — ED Provider Notes (Signed)
[facility] Emergency Department Provider Note   ____________________________________________   First MD Initiated Contact with Patient 08/29/19 [phone]     (approximate)  I have reviewed the triage vital signs and the nursing notes.   HISTORY  Chief Complaint Allergic Reaction    HPI Ann Bowen is a 64 y.o. female with past medical history of depression, fibromyalgia who presents to the ED for allergic reaction.  Patient reports that she woke up this morning with acute onset diffuse redness of her skin with itchy rash.  She states she felt lightheaded at onset and fell to the ground at one point, "lightly" hitting her head but not losing consciousness.  She denies any chest pain or shortness of breath and has not noticed any swelling around her face or mouth.  She denies any difficulty breathing and has not had any vomiting or diarrhea.  She has not had any recent changes in medications or new foods, does state that she got her first dose of the COVID-19 vaccine 2 days ago.        Past Medical History:  Diagnosis Date  . Arthritis   . Borderline personality disorder ([place])   . Depression   . Fibromyalgia   . Shingles     Patient Active Problem List   Diagnosis Date Noted  . History of colonic polyps   . Borderline personality disorder ([place]) 08/10/2015  . MDD (major depressive disorder), recurrent episode ([place]) 08/10/2015  . Fibromyalgia 08/10/2015  . Osteoarthritis 08/10/2015  . Tobacco use disorder 08/10/2015    Past Surgical History:  Procedure Laterality Date  . CHOLECYSTECTOMY    . COLONOSCOPY WITH PROPOFOL N/A 01/01/2018   Procedure: COLONOSCOPY WITH PROPOFOL;  Surgeon: Lin Landsman, MD;  Location: [facility] ENDOSCOPY;  Service: Gastroenterology;  Laterality: N/A;  . GASTRIC BYPASS    . SHOULDER SURGERY      Prior to Admission medications   Medication Sig Start Date End Date Taking? Authorizing Provider  clonazePAM (KLONOPIN) 1 MG  tablet Take 1 mg by mouth 2 (two) times daily.    [provider]    Allergies Wellbutrin [bupropion]  No family history on file.  Social History Social History   Tobacco Use  . Smoking status: Never Smoker  . Smokeless tobacco: Never Used  Substance Use Topics  . Alcohol use: Yes    Comment: occasionaly  . Drug use: Never    Review of Systems  Constitutional: No fever/chills.  Positive for lightheadedness. Eyes: No visual changes. ENT: No sore throat. Cardiovascular: Denies chest pain. Respiratory: Denies shortness of breath. Gastrointestinal: No abdominal pain.  No nausea, no vomiting.  No diarrhea.  No constipation. Genitourinary: Negative for dysuria. Musculoskeletal: Negative for back pain. Skin: Positive for rash. Neurological: Negative for headaches, focal weakness or numbness.  ____________________________________________   PHYSICAL EXAM:  VITAL SIGNS: ED Triage Vitals  Enc Vitals Group     BP 08/29/19 0853 96/79     Pulse Rate 08/29/19 0853 80     Resp 08/29/19 0853 (!) 22     Temp --      Temp Source 08/29/19 0853 Oral     SpO2 08/29/19 0853 97 %     Weight 08/29/19 0854 250 lb (113.4 kg)     Height 08/29/19 0854 5\' 3"  (1.6 m)     Head Circumference --      Peak Flow --      Pain Score 08/29/19 0854 0     Pain  Loc --      Pain Edu? --      Excl. in [place]? --     Constitutional: Alert and oriented. Eyes: Conjunctivae are normal. Head: Atraumatic. Nose: No congestion/rhinnorhea. Mouth/Throat: Mucous membranes are moist. Neck: Normal ROM Cardiovascular: Normal rate, regular rhythm. Grossly normal heart sounds. Respiratory: Normal respiratory effort.  No retractions. Lungs CTAB. Gastrointestinal: Soft and nontender. No distention. Genitourinary: deferred Musculoskeletal: No lower extremity tenderness nor edema. Neurologic:  Normal speech and language. No gross focal neurologic deficits are appreciated. Skin:  Skin is warm, dry and  intact. Diffuse erythematous rash to neck, arms, and upper thorax with excoriations to arms from scratching. Psychiatric: Mood and affect are normal. Speech and behavior are normal.  ____________________________________________   LABS (all labs ordered are listed, but only abnormal results are displayed)  Labs Reviewed  BASIC METABOLIC PANEL - Abnormal; Notable for the following components:      Result Value   Potassium 3.2 (*)    Glucose, Bld 117 (*)    Creatinine, Ser 1.15 (*)    GFR calc non Af Amer 50 (*)    GFR calc Af Amer 58 (*)    All other components within normal limits  CBC WITH DIFFERENTIAL/PLATELET - Abnormal; Notable for the following components:   WBC 12.1 (*)    RBC 5.73 (*)    Hemoglobin 16.2 (*)    HCT 50.0 (*)    Abs Immature Granulocytes 0.08 (*)    All other components within normal limits   ____________________________________________  EKG  ED ECG REPORT I, Blake Divine, the attending physician, personally viewed and interpreted this ECG.   Date: 08/29/2019  EKG Time: 9:33  Rate: 58  Rhythm: normal sinus rhythm  Axis: Normal  Intervals:none  ST&T Change: None   PROCEDURES  Procedure(s) performed (including Critical Care):  Procedures   ____________________________________________   INITIAL IMPRESSION / ASSESSMENT AND PLAN / ED COURSE       64 year old female presents to the ED after waking up with diffuse redness and itching along with some lightheadedness.  She did fall to the ground at 1 point and admits to hitting her head, but states that this was very minor and she has no obvious signs of trauma.  EKG shows no evidence of arrhythmia or ischemia to explain her lightheadedness, lab work is also unremarkable.  At this point, reaction to COVID-19 vaccine 2 days out seems unlikely, she does appear to be having an allergic reaction to unclear trigger.  Reaction does not seem to be consistent with anaphylaxis, patient was given Solu-Medrol,  Benadryl, and Pepcid with significant relief in symptoms.  She was observed for greater than 3 hours with no recurrence of symptoms, is now appropriate for discharge home.  Patient agrees with plan.      ____________________________________________   FINAL CLINICAL IMPRESSION(S) / ED DIAGNOSES  Final diagnoses:  Allergic reaction, initial encounter     ED Discharge Orders    None       Note:  This document was prepared using Dragon voice recognition software and may include unintentional dictation errors.   Blake Divine, MD 08/29/19 [phone]

## 2019-09-24 DIAGNOSIS — R55 Syncope and collapse: Secondary | ICD-10-CM | POA: Insufficient documentation

## 2019-09-24 DIAGNOSIS — R202 Paresthesia of skin: Secondary | ICD-10-CM | POA: Insufficient documentation

## 2019-09-24 DIAGNOSIS — R252 Cramp and spasm: Secondary | ICD-10-CM | POA: Insufficient documentation

## 2019-09-24 DIAGNOSIS — R42 Dizziness and giddiness: Secondary | ICD-10-CM | POA: Insufficient documentation

## 2019-09-24 DIAGNOSIS — R296 Repeated falls: Secondary | ICD-10-CM | POA: Insufficient documentation

## 2019-09-24 DIAGNOSIS — G2581 Restless legs syndrome: Secondary | ICD-10-CM | POA: Insufficient documentation

## 2019-10-05 ENCOUNTER — Other Ambulatory Visit: Payer: Self-pay

## 2019-10-05 ENCOUNTER — Emergency Department: Payer: Medicare Other

## 2019-10-05 ENCOUNTER — Encounter: Payer: Self-pay | Admitting: Emergency Medicine

## 2019-10-05 ENCOUNTER — Observation Stay
Admission: EM | Admit: 2019-10-05 | Discharge: 2019-10-06 | Disposition: A | Discharge status: Home / Self Care | Payer: Medicare Other | Attending: Internal Medicine | Admitting: Internal Medicine

## 2019-10-05 DIAGNOSIS — A4189 Other specified sepsis: Secondary | ICD-10-CM | POA: Diagnosis not present

## 2019-10-05 DIAGNOSIS — Z8619 Personal history of other infectious and parasitic diseases: Secondary | ICD-10-CM | POA: Diagnosis not present

## 2019-10-05 DIAGNOSIS — A09 Infectious gastroenteritis and colitis, unspecified: Secondary | ICD-10-CM

## 2019-10-05 DIAGNOSIS — Z791 Long term (current) use of non-steroidal anti-inflammatories (NSAID): Secondary | ICD-10-CM | POA: Insufficient documentation

## 2019-10-05 DIAGNOSIS — R509 Fever, unspecified: Secondary | ICD-10-CM | POA: Diagnosis present

## 2019-10-05 DIAGNOSIS — M797 Fibromyalgia: Secondary | ICD-10-CM | POA: Diagnosis not present

## 2019-10-05 DIAGNOSIS — Z833 Family history of diabetes mellitus: Secondary | ICD-10-CM | POA: Insufficient documentation

## 2019-10-05 DIAGNOSIS — Z8249 Family history of ischemic heart disease and other diseases of the circulatory system: Secondary | ICD-10-CM | POA: Diagnosis not present

## 2019-10-05 DIAGNOSIS — R197 Diarrhea, unspecified: Secondary | ICD-10-CM | POA: Diagnosis present

## 2019-10-05 DIAGNOSIS — A0811 Acute gastroenteropathy due to Norwalk agent: Secondary | ICD-10-CM

## 2019-10-05 DIAGNOSIS — F603 Borderline personality disorder: Secondary | ICD-10-CM | POA: Diagnosis not present

## 2019-10-05 DIAGNOSIS — B9789 Other viral agents as the cause of diseases classified elsewhere: Secondary | ICD-10-CM | POA: Insufficient documentation

## 2019-10-05 DIAGNOSIS — Z20822 Contact with and (suspected) exposure to covid-19: Secondary | ICD-10-CM | POA: Insufficient documentation

## 2019-10-05 DIAGNOSIS — Z888 Allergy status to other drugs, medicaments and biological substances status: Secondary | ICD-10-CM | POA: Diagnosis not present

## 2019-10-05 DIAGNOSIS — I700 Atherosclerosis of aorta: Secondary | ICD-10-CM | POA: Diagnosis not present

## 2019-10-05 DIAGNOSIS — Z9049 Acquired absence of other specified parts of digestive tract: Secondary | ICD-10-CM | POA: Insufficient documentation

## 2019-10-05 DIAGNOSIS — Z86010 Personal history of colonic polyps: Secondary | ICD-10-CM | POA: Diagnosis not present

## 2019-10-05 DIAGNOSIS — K760 Fatty (change of) liver, not elsewhere classified: Secondary | ICD-10-CM | POA: Insufficient documentation

## 2019-10-05 DIAGNOSIS — F418 Other specified anxiety disorders: Secondary | ICD-10-CM | POA: Diagnosis present

## 2019-10-05 DIAGNOSIS — M1990 Unspecified osteoarthritis, unspecified site: Secondary | ICD-10-CM | POA: Insufficient documentation

## 2019-10-05 DIAGNOSIS — Z79899 Other long term (current) drug therapy: Secondary | ICD-10-CM | POA: Insufficient documentation

## 2019-10-05 DIAGNOSIS — A419 Sepsis, unspecified organism: Secondary | ICD-10-CM | POA: Diagnosis present

## 2019-10-05 DIAGNOSIS — Z9884 Bariatric surgery status: Secondary | ICD-10-CM | POA: Insufficient documentation

## 2019-10-05 DIAGNOSIS — F419 Anxiety disorder, unspecified: Secondary | ICD-10-CM | POA: Diagnosis not present

## 2019-10-05 DIAGNOSIS — F329 Major depressive disorder, single episode, unspecified: Secondary | ICD-10-CM | POA: Diagnosis not present

## 2019-10-05 DIAGNOSIS — F32A Depression, unspecified: Secondary | ICD-10-CM | POA: Insufficient documentation

## 2019-10-05 DIAGNOSIS — R1084 Generalized abdominal pain: Secondary | ICD-10-CM | POA: Diagnosis not present

## 2019-10-05 LAB — COMPREHENSIVE METABOLIC PANEL
ALT: 15 U/L (ref 0–44)
AST: 23 U/L (ref 15–41)
Albumin: 3.9 g/dL (ref 3.5–5.0)
Alkaline Phosphatase: 91 U/L (ref 38–126)
Anion gap: 11 (ref 5–15)
BUN: 16 mg/dL (ref 8–23)
CO2: 21 mmol/L — ABNORMAL LOW (ref 22–32)
Calcium: 8.3 mg/dL — ABNORMAL LOW (ref 8.9–10.3)
Chloride: 104 mmol/L (ref 98–111)
Creatinine, Ser: 0.86 mg/dL (ref 0.44–1.00)
GFR calc Af Amer: >60 mL/min (ref >60)
GFR calc non Af Amer: >60 mL/min (ref >60)
Glucose, Bld: 140 mg/dL — ABNORMAL HIGH (ref 70–99)
Potassium: 3.7 mmol/L (ref 3.5–5.1)
Sodium: 136 mmol/L (ref 135–145)
Total Bilirubin: 1.1 mg/dL (ref 0.3–1.2)
Total Protein: 7.4 g/dL (ref 6.5–8.1)

## 2019-10-05 LAB — URINALYSIS, COMPLETE (UACMP) WITH MICROSCOPIC
Bacteria, UA: NONE SEEN
Bilirubin Urine: NEGATIVE
Glucose, UA: NEGATIVE mg/dL
Ketones, ur: NEGATIVE mg/dL
Leukocytes,Ua: NEGATIVE
Nitrite: NEGATIVE
Protein, ur: NEGATIVE mg/dL
Specific Gravity, Urine: 1.011 (ref 1.005–1.030)
pH: 5 (ref 5.0–8.0)

## 2019-10-05 LAB — GASTROINTESTINAL PANEL BY PCR, STOOL (REPLACES STOOL CULTURE)

## 2019-10-05 LAB — CBC WITH DIFFERENTIAL/PLATELET
Abs Immature Granulocytes: 0.06 10*3/uL (ref 0.00–0.07)
Basophils Absolute: 0 10*3/uL (ref 0.0–0.1)
Basophils Relative: 0 %
Eosinophils Absolute: 0 10*3/uL (ref 0.0–0.5)
Eosinophils Relative: 0 %
HCT: 39.4 % (ref 36.0–46.0)
Hemoglobin: 12.9 g/dL (ref 12.0–15.0)
Immature Granulocytes: 1 %
Lymphocytes Relative: 4 %
Lymphs Abs: 0.3 10*3/uL — ABNORMAL LOW (ref 0.7–4.0)
MCH: 28.1 pg (ref 26.0–34.0)
MCHC: 32.7 g/dL (ref 30.0–36.0)
MCV: 85.8 fL (ref 80.0–100.0)
Monocytes Absolute: 0.2 10*3/uL (ref 0.1–1.0)
Monocytes Relative: 3 %
Neutro Abs: 6.9 10*3/uL (ref 1.7–7.7)
Neutrophils Relative %: 92 %
Platelets: 232 10*3/uL (ref 150–400)
RBC: 4.59 MIL/uL (ref 3.87–5.11)
RDW: 13.9 % (ref 11.5–15.5)
WBC: 7.5 10*3/uL (ref 4.0–10.5)
nRBC: 0 % (ref 0.0–0.2)

## 2019-10-05 LAB — LACTIC ACID, PLASMA
Lactic Acid, Venous: 2.2 mmol/L (ref 0.5–1.9)
Lactic Acid, Venous: 2.2 mmol/L (ref 0.5–1.9)
Lactic Acid, Venous: 2.6 mmol/L (ref 0.5–1.9)

## 2019-10-05 LAB — INFLUENZA PANEL BY PCR (TYPE A & B)
Influenza A By PCR: NEGATIVE
Influenza B By PCR: NEGATIVE

## 2019-10-05 LAB — C DIFFICILE QUICK SCREEN W PCR REFLEX
C Diff antigen: NEGATIVE
C Diff interpretation: NOT DETECTED
C Diff toxin: NEGATIVE

## 2019-10-05 LAB — SARS CORONAVIRUS 2 BY RT PCR (HOSPITAL ORDER, PERFORMED IN ~~LOC~~ HOSPITAL LAB): SARS Coronavirus 2: NEGATIVE

## 2019-10-05 LAB — PROCALCITONIN: Procalcitonin: 0.26 ng/mL

## 2019-10-05 IMAGING — CT CT ABD-PELV W/ CM
2 of 5 series · 16 of 46 positions shown, 18 images · IV contrast (APPLIED)
Comparison: [DATE]

CLINICAL DATA: Abdominal pain, fevers and diarrhea

EXAM:
CT ABDOMEN AND PELVIS WITH CONTRAST
TECHNIQUE: Multidetector CT imaging of the abdomen and pelvis was performed
using the standard protocol following bolus administration of
intravenous contrast.
CONTRAST:  100mL OMNIPAQUE IOHEXOL 300 MG/ML  SOLN

[Series 2: routine abd/pel with · axial · 0.86mm/px · z∈[-892,-436]mm · 13 of 103 slices shown, 15 images]
[im 6/103  soft-tissue]
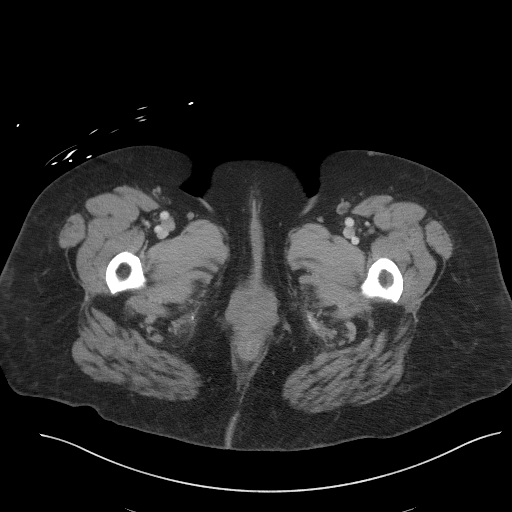
[im 6/103  bone]
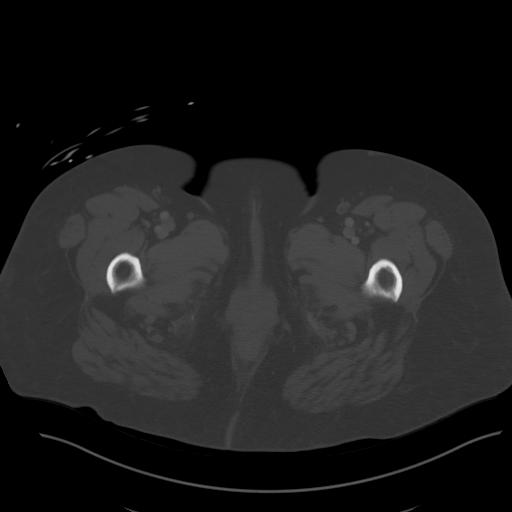
[im 12/103  soft-tissue]
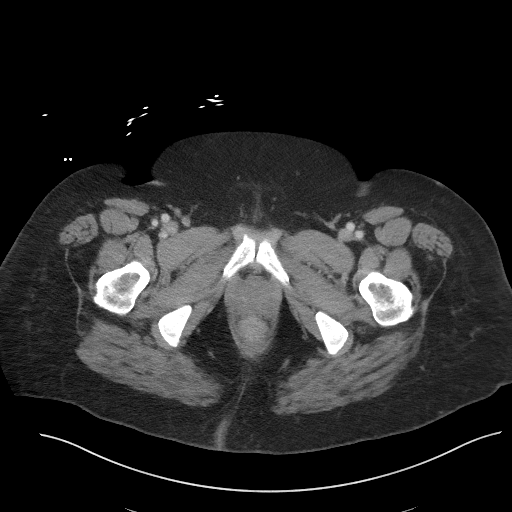
[im 23/103  soft-tissue]
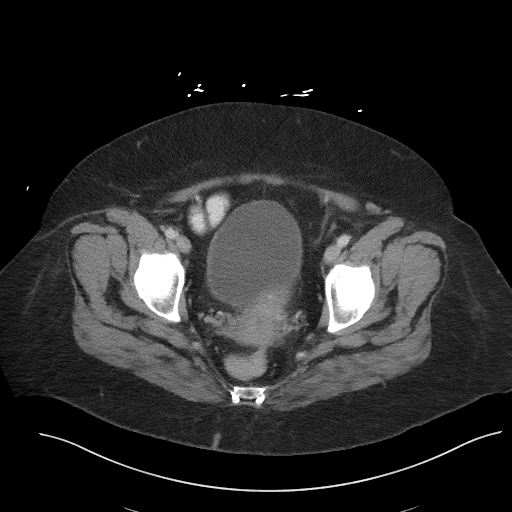
[im 29/103  soft-tissue]
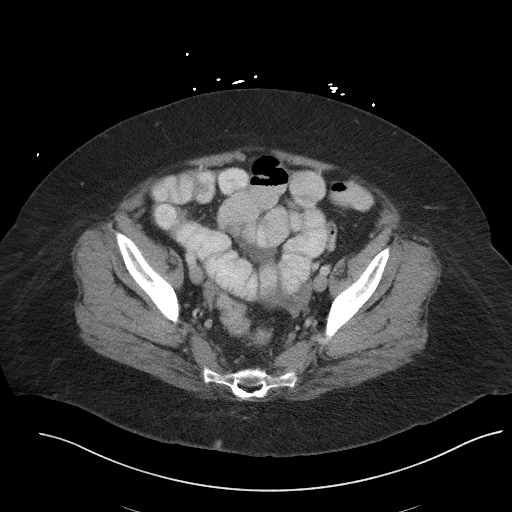
[im 35/103  soft-tissue]
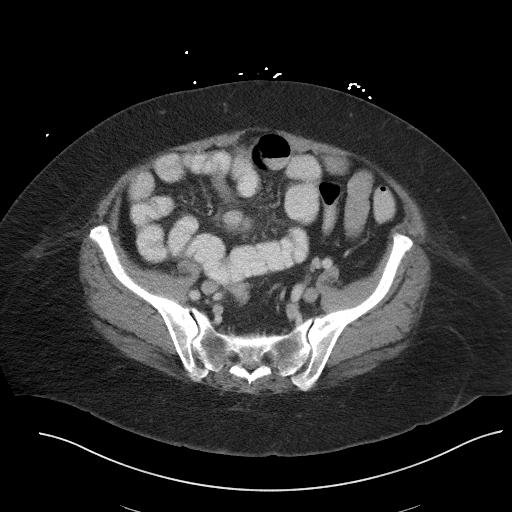
[im 46/103  soft-tissue]
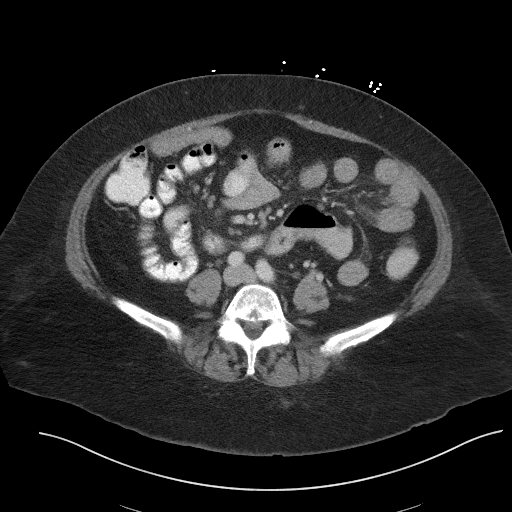
[im 52/103  soft-tissue]
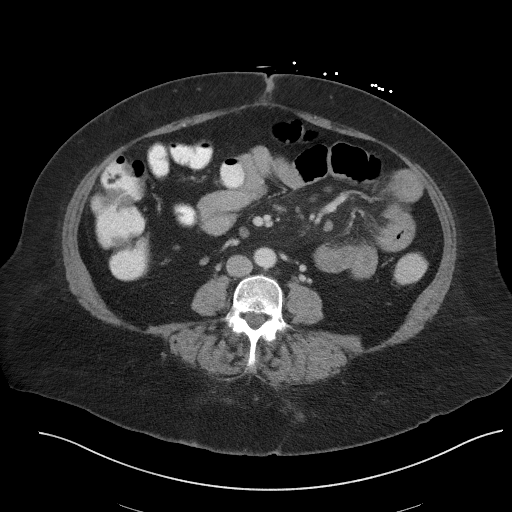
[im 57/103  soft-tissue]
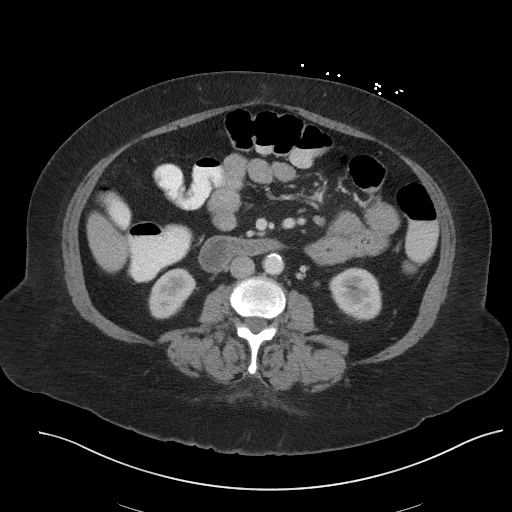
[im 69/103  soft-tissue]
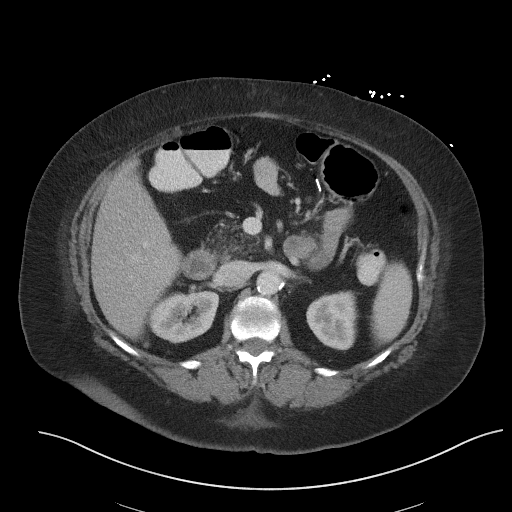
[im 69/103  bone]
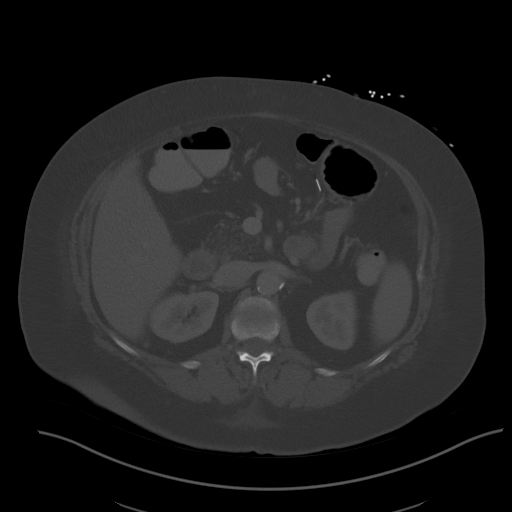
[im 74/103  soft-tissue]
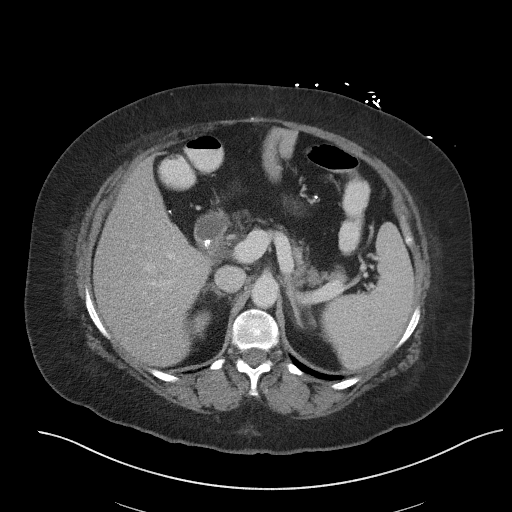
[im 80/103  soft-tissue]
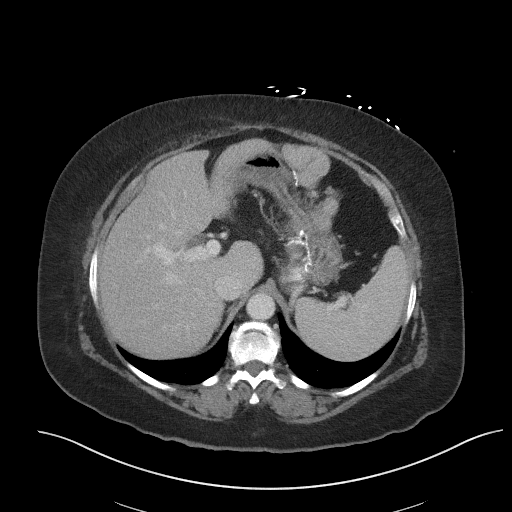
[im 91/103  soft-tissue]
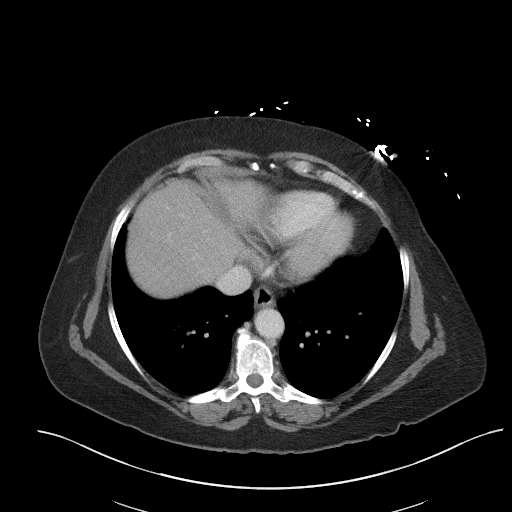
[im 97/103  soft-tissue]
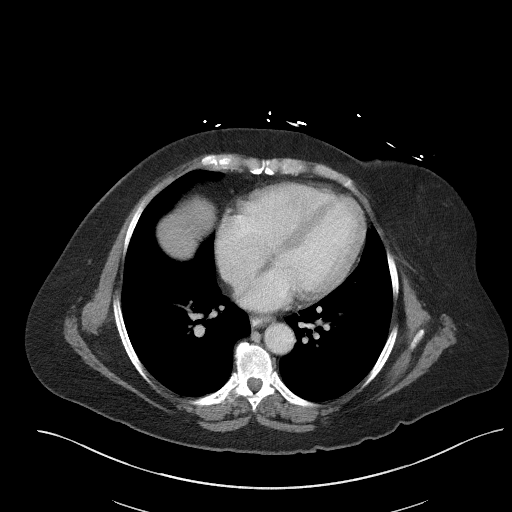

[Series 5: coronal st · coronal · 0.82mm/px · 3 of 110 slices shown]
[im 37/110  soft-tissue]
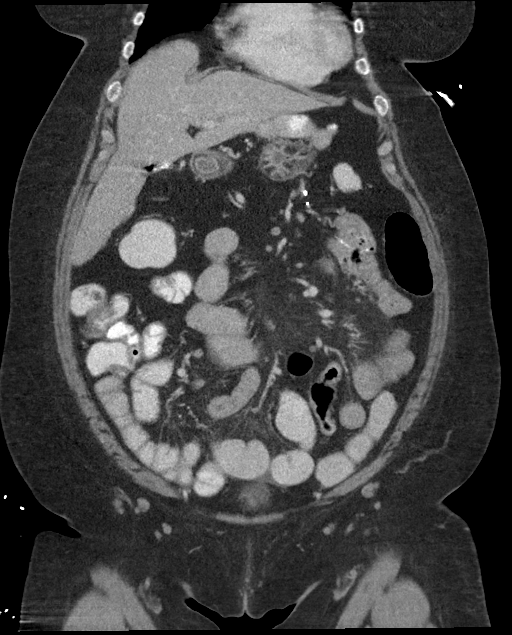
[im 49/110  soft-tissue]
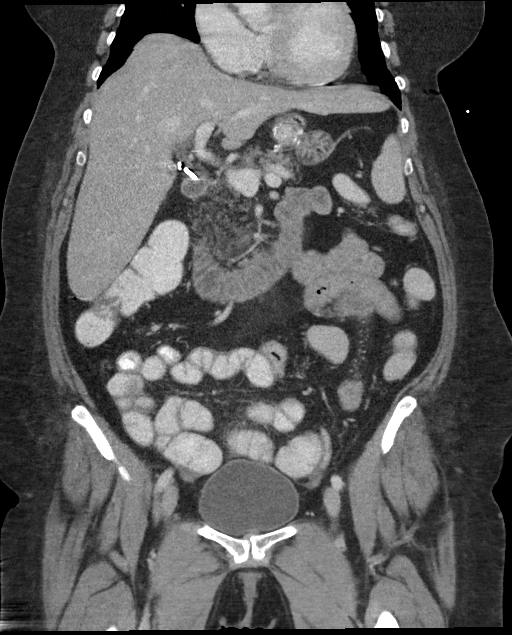
[im 61/110  soft-tissue]
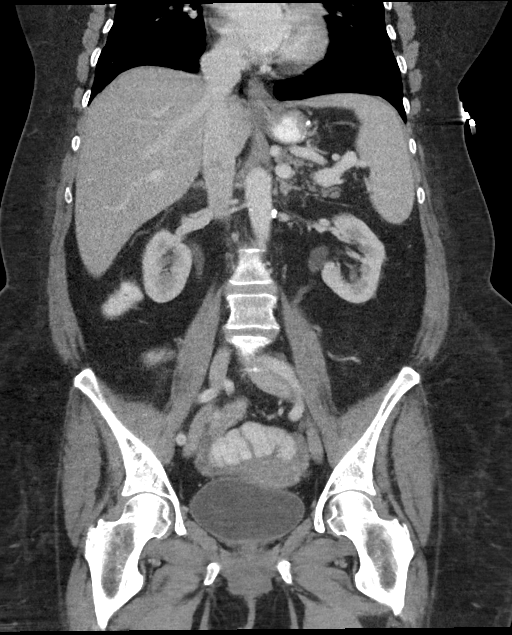

[16 of 46 positions shown; findings below may reference images not displayed]

FINDINGS: Lower chest: No acute abnormality.

Hepatobiliary: Fatty infiltration of the liver is noted. The
gallbladder has been surgically removed.

Pancreas: Fatty interdigitation of the pancreas is noted. No mass
lesion is seen.

Spleen: Normal in size without focal abnormality.

Adrenals/Urinary Tract: Adrenal glands are within normal limits.
Kidneys demonstrate a normal enhancement pattern. No renal calculi
or obstructive changes are seen. The bladder is partially distended.

Stomach/Bowel: Fluid is noted throughout the colon consistent with
the diarrheal state. No obstructive changes are seen. The appendix
is well visualized and within normal limits. Stomach is
decompressed. Postsurgical changes are noted. Small bowel shows
fluid throughout consistent with the diarrheal state.

Vascular/Lymphatic: Aortic atherosclerosis. No enlarged abdominal or
pelvic lymph nodes.

Reproductive: Uterus and bilateral adnexa are unremarkable.

Other: No abdominal wall hernia or abnormality. No abdominopelvic
ascites.

Musculoskeletal: No acute or significant osseous findings.
IMPRESSION: Fluid throughout the large and small bowel consistent with the
diarrheal state.

Fatty liver.

No other focal abnormality is noted.

## 2019-10-05 IMAGING — DX DG CHEST 1V PORT
1 series · 1 of 1 positions shown · non-contrast
Comparison: [DATE]

CLINICAL DATA: Sepsis

EXAM:
PORTABLE CHEST 1 VIEW

[chest ap]
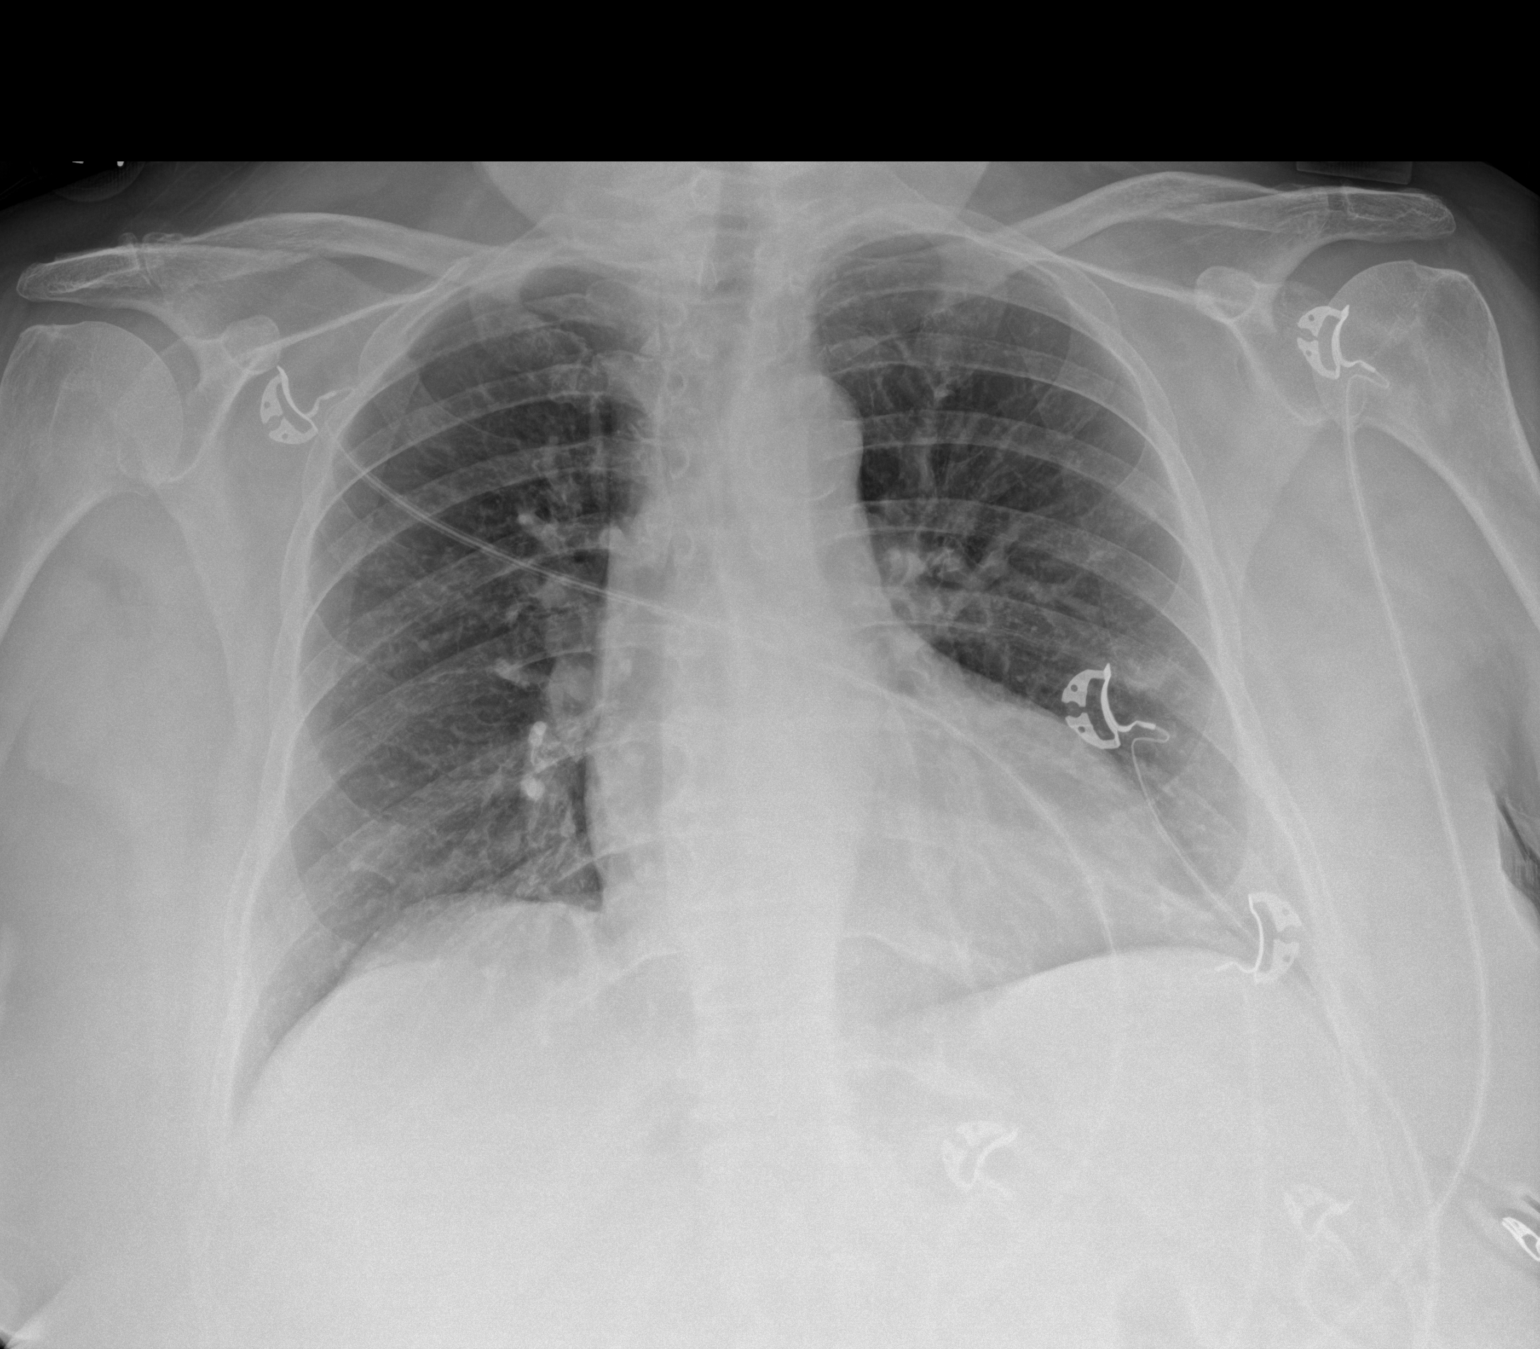

[1 of 1 positions shown; findings below may reference images not displayed]

FINDINGS: Borderline heart size. Normal aortic contours. There is no edema,
consolidation, effusion, or pneumothorax. Artifact from EKG leads.
IMPRESSION: Negative for pneumonia.

## 2019-10-05 MED ORDER — SODIUM CHLORIDE 0.9 % IV BOLUS: 1000.0000 mL | Freq: Once | INTRAVENOUS | Status: DC

## 2019-10-05 MED ORDER — ENOXAPARIN SODIUM 40 MG/0.4ML ~~LOC~~ SOLN
40.0000 mg | SUBCUTANEOUS | Status: DC
  Administered 2019-10-05: 40 mg via SUBCUTANEOUS
  Filled 2019-10-05: qty 0.4

## 2019-10-05 MED ORDER — SODIUM CHLORIDE 0.9 % IV SOLN: INTRAVENOUS | Status: DC

## 2019-10-05 MED ORDER — LOPERAMIDE HCL 2 MG PO TABS
2.0000 mg | ORAL_TABLET | Freq: Four times a day (QID) | ORAL | 0 refills | Status: DC | PRN
Start: 2019-10-05 — End: 2019-10-06

## 2019-10-05 MED ORDER — TIZANIDINE HCL 4 MG PO TABS
4.0000 mg | ORAL_TABLET | Freq: Three times a day (TID) | ORAL | Status: DC
  Administered 2019-10-05: 4 mg via ORAL
  Filled 2019-10-05 (×5): qty 1

## 2019-10-05 MED ORDER — ACETAMINOPHEN 325 MG PO TABS
650.0000 mg | ORAL_TABLET | Freq: Four times a day (QID) | ORAL | Status: DC | PRN
  Administered 2019-10-05: 650 mg via ORAL
  Filled 2019-10-05: qty 2

## 2019-10-05 MED ORDER — GABAPENTIN 300 MG PO CAPS
300.0000 mg | ORAL_CAPSULE | Freq: Three times a day (TID) | ORAL | Status: DC
  Administered 2019-10-05 – 2019-10-06 (×2): 300 mg via ORAL
  Filled 2019-10-05 (×2): qty 1

## 2019-10-05 MED ORDER — SODIUM CHLORIDE 0.9 % IV BOLUS
500.0000 mL | Freq: Once | INTRAVENOUS | Status: AC
  Administered 2019-10-05: 500 mL via INTRAVENOUS

## 2019-10-05 MED ORDER — ONDANSETRON HCL 4 MG PO TABS: 4.0000 mg | ORAL_TABLET | Freq: Every day | ORAL | 0 refills | Status: DC | PRN

## 2019-10-05 MED ORDER — SODIUM CHLORIDE 0.9 % IV SOLN
2.0000 g | Freq: Once | INTRAVENOUS | Status: AC
  Administered 2019-10-05: 2 g via INTRAVENOUS
  Filled 2019-10-05: qty 2

## 2019-10-05 MED ORDER — LACTATED RINGERS IV BOLUS
1000.0000 mL | Freq: Once | INTRAVENOUS | Status: AC
  Administered 2019-10-05: 1000 mL via INTRAVENOUS

## 2019-10-05 MED ORDER — ONDANSETRON HCL 4 MG/2ML IJ SOLN: 4.0000 mg | Freq: Three times a day (TID) | INTRAMUSCULAR | Status: DC | PRN

## 2019-10-05 MED ORDER — MORPHINE SULFATE (PF) 2 MG/ML IV SOLN
0.5000 mg | INTRAVENOUS | Status: DC | PRN
  Administered 2019-10-05: 0.5 mg via INTRAVENOUS
  Filled 2019-10-05: qty 1

## 2019-10-05 MED ORDER — SODIUM CHLORIDE 0.9 % IV BOLUS
1000.0000 mL | Freq: Once | INTRAVENOUS | Status: AC
  Administered 2019-10-05: 1000 mL via INTRAVENOUS

## 2019-10-05 MED ORDER — ACETAMINOPHEN 500 MG PO TABS
1000.0000 mg | ORAL_TABLET | Freq: Once | ORAL | Status: AC
  Administered 2019-10-05: 1000 mg via ORAL
  Filled 2019-10-05: qty 2

## 2019-10-05 MED ORDER — IOHEXOL 300 MG/ML  SOLN
100.0000 mL | Freq: Once | INTRAMUSCULAR | Status: AC | PRN
  Administered 2019-10-05: 100 mL via INTRAVENOUS

## 2019-10-05 MED ORDER — METRONIDAZOLE IN NACL 5-0.79 MG/ML-% IV SOLN
500.0000 mg | Freq: Once | INTRAVENOUS | Status: AC
  Administered 2019-10-05: 500 mg via INTRAVENOUS
  Filled 2019-10-05: qty 100

## 2019-10-05 MED ORDER — FENTANYL CITRATE (PF) 100 MCG/2ML IJ SOLN
50.0000 ug | Freq: Once | INTRAMUSCULAR | Status: AC
  Administered 2019-10-05: 50 ug via INTRAVENOUS
  Filled 2019-10-05: qty 2

## 2019-10-05 MED ORDER — IOHEXOL 9 MG/ML PO SOLN
500.0000 mL | Freq: Once | ORAL | Status: DC | PRN
  Administered 2019-10-05: 1000 mL via ORAL

## 2019-10-05 MED ORDER — DOXEPIN HCL 10 MG PO CAPS
10.0000 mg | ORAL_CAPSULE | Freq: Every day | ORAL | Status: DC
  Administered 2019-10-05: 10 mg via ORAL
  Filled 2019-10-05 (×3): qty 1

## 2019-10-05 MED ORDER — CLONAZEPAM 1 MG PO TABS
1.0000 mg | ORAL_TABLET | Freq: Two times a day (BID) | ORAL | Status: DC
  Administered 2019-10-05 – 2019-10-06 (×2): 1 mg via ORAL
  Filled 2019-10-05 (×2): qty 1

## 2019-10-05 MED ORDER — ONDANSETRON HCL 4 MG/2ML IJ SOLN
4.0000 mg | Freq: Once | INTRAMUSCULAR | Status: AC
  Administered 2019-10-05: 4 mg via INTRAVENOUS
  Filled 2019-10-05: qty 2

## 2019-10-05 MED ORDER — VANCOMYCIN HCL IN DEXTROSE 1-5 GM/200ML-% IV SOLN
1000.0000 mg | Freq: Once | INTRAVENOUS | Status: AC
  Administered 2019-10-05: 1000 mg via INTRAVENOUS
  Filled 2019-10-05: qty 200

## 2019-10-05 MED ORDER — ROPINIROLE HCL 1 MG PO TABS
1.0000 mg | ORAL_TABLET | Freq: Two times a day (BID) | ORAL | Status: DC
  Administered 2019-10-05: 23:00:00 1 mg via ORAL
  Filled 2019-10-05 (×3): qty 1

## 2019-10-05 NOTE — ED Notes (Signed)
Pt reports coming into the ED due to abdominal pain, nausea and fever. Pt states her fever and pain are coming back. Medications given, please see MAR. Pt reports being hungry. Pt provider with meal tray. Pt resting with visitor at bedside.

## 2019-10-05 NOTE — ED Triage Notes (Signed)
Patient states that she thinks that she has covid. Patient with complaint of body aches and diarrhea that started about 20:00 last night. Patient states that she has been running a fever but unable to check her temperature that her thermometer did not work.

## 2019-10-05 NOTE — ED Provider Notes (Signed)
[facility] Emergency Department Provider Note  ____________________________________________  Time seen: Approximately 6:24 AM  I have reviewed the triage vital signs and the nursing notes.   HISTORY  Chief Complaint Generalized Body Aches and Diarrhea   HPI Ann Bowen is a 63 y.o. female with history of fibromyalgia, depression, borderline personality disorder who presents for evaluation of generalized body aches, fever and diarrhea.  Symptoms started 8 PM last night.  She is complaining of diffuse cramping abdominal pain, and generalized body aches, fever, several episodes of watery diarrhea, nausea.  No cough, chest pain, shortness of breath, sore throat, loss of taste or smell, dysuria or hematuria.  No known exposures to Covid.  Patient did take the first [place] shot the beginning of April.  She reports that she felt very sick after taking the shot so she decided not to take the second one.  No recent antibiotic use or history of C. difficile.   Past Medical History:  Diagnosis Date  . Arthritis   . Borderline personality disorder ([place])   . Depression   . Fibromyalgia   . Shingles     Patient Active Problem List   Diagnosis Date Noted  . History of colonic polyps   . Borderline personality disorder ([place]) 08/10/2015  . MDD (major depressive disorder), recurrent episode ([place]) 08/10/2015  . Fibromyalgia 08/10/2015  . Osteoarthritis 08/10/2015  . Tobacco use disorder 08/10/2015    Past Surgical History:  Procedure Laterality Date  . CHOLECYSTECTOMY    . COLONOSCOPY WITH PROPOFOL N/A 01/01/2018   Procedure: COLONOSCOPY WITH PROPOFOL;  Surgeon: Lin Landsman, MD;  Location[facility] ENDOSCOPY;  Service: Gastroenterology;  Laterality: N/A;  . GASTRIC BYPASS    . SHOULDER SURGERY      Prior to Admission medications   Medication Sig Start Date End Date Taking? Authorizing Provider  clonazePAM (KLONOPIN) 1 MG tablet Take 1 mg by mouth 2 (two)  times daily.    [provider]    Allergies Wellbutrin [bupropion]  No family history on file.  Social History Social History   Tobacco Use  . Smoking status: Never Smoker  . Smokeless tobacco: Never Used  Substance Use Topics  . Alcohol use: Yes    Comment: occasionaly  . Drug use: Never    Review of Systems  Constitutional: + fever, generalized body ache Eyes: Negative for visual changes. ENT: Negative for sore throat. Neck: No neck pain  Cardiovascular: Negative for chest pain. Respiratory: Negative for shortness of breath. Gastrointestinal: + abdominal cramping, diarrhea, nausea.  Genitourinary: Negative for dysuria. Musculoskeletal: Negative for back pain. Skin: Negative for rash. Neurological: Negative for headaches, weakness or numbness. Psych: No SI or HI  ____________________________________________   PHYSICAL EXAM:  VITAL SIGNS: ED Triage Vitals  Enc Vitals Group     BP 10/05/19 0606 (!) 149/71     Pulse Rate 10/05/19 0606 (!) 105     Resp 10/05/19 0606 (!) 28     Temp 10/05/19 0606 (!) 102.9 F (39.4 C)     Temp Source 10/05/19 0606 Oral     SpO2 10/05/19 0606 96 %     Weight 10/05/19 0600 250 lb (113.4 kg)     Height 10/05/19 0600 5\' 3"  (1.6 m)     Head Circumference --      Peak Flow --      Pain Score 10/05/19 0600 5     Pain Loc --      Pain Edu? --  Excl. in [place]? --     Constitutional: Alert and oriented. Well appearing and in no apparent distress. HEENT:      Head: Normocephalic and atraumatic.         Eyes: Conjunctivae are normal. Sclera is non-icteric.       Mouth/Throat: Mucous membranes are moist.       Neck: Supple with no signs of meningismus. Cardiovascular: Tachycardic with regular rhythm Respiratory: Tachypneic with normal respiratory effort and clear lungs on auscultation. Gastrointestinal: Soft, non tender, and non distended with positive bowel sounds. No rebound or guarding. Musculoskeletal:  No edema,  cyanosis, or erythema of extremities. Neurologic: Normal speech and language. Face is symmetric. Moving all extremities. No gross focal neurologic deficits are appreciated. Skin: Skin is warm, dry and intact. No rash noted. Psychiatric: Mood and affect are normal. Speech and behavior are normal.  ____________________________________________   LABS (all labs ordered are listed, but only abnormal results are displayed)  Labs Reviewed  CBC WITH DIFFERENTIAL/PLATELET - Abnormal; Notable for the following components:      Result Value   Lymphs Abs 0.3 (*)    All other components within normal limits  COMPREHENSIVE METABOLIC PANEL - Abnormal; Notable for the following components:   CO2 21 (*)    Glucose, Bld 140 (*)    Calcium 8.3 (*)    All other components within normal limits  SARS CORONAVIRUS 2 BY RT PCR (HOSPITAL ORDER, [facility])  CULTURE, BLOOD (ROUTINE X 2)  CULTURE, BLOOD (ROUTINE X 2)  URINE CULTURE  C DIFFICILE QUICK SCREEN W PCR REFLEX  INFLUENZA PANEL BY PCR (TYPE A & B)  LACTIC ACID, PLASMA  URINALYSIS, COMPLETE (UACMP) WITH MICROSCOPIC  PROCALCITONIN   ____________________________________________  EKG  ED ECG REPORT I, Rudene Re, the attending physician, personally viewed and interpreted this ECG.  Sinus tachycardia, rate of 101, normal intervals, normal axis, no ST elevations or depressions. ____________________________________________  RADIOLOGY  I have personally reviewed the images performed during this visit and I agree with the Radiologist's read.   Interpretation by Radiologist:  No results found.    ____________________________________________   PROCEDURES  Procedure(s) performed:yes .1-3 Lead EKG Interpretation Performed by: Rudene Re, MD Authorized by: Rudene Re, MD     Interpretation: normal     ECG rate assessment: tachycardic     Rhythm: sinus tachycardia     Ectopy: none      Conduction: normal     Critical Care performed: yes  CRITICAL CARE Performed by: Rudene Re  ?  Total critical care time: 45 min  Critical care time was exclusive of separately billable procedures and treating other patients.  Critical care was necessary to treat or prevent imminent or life-threatening deterioration.  Critical care was time spent personally by me on the following activities: development of treatment plan with patient and/or surrogate as well as nursing, discussions with consultants, evaluation of patient's response to treatment, examination of patient, obtaining history from patient or surrogate, ordering and performing treatments and interventions, ordering and review of laboratory studies, ordering and review of radiographic studies, pulse oximetry and re-evaluation of patient's condition.  ____________________________________________   INITIAL IMPRESSION / ASSESSMENT AND PLAN / ED COURSE   64 y.o. female with history of fibromyalgia, depression, borderline personality disorder who presents for evaluation of generalized body aches, fever and diarrhea that started 8 PM last night.  On arrival patient has a fever of 102.71F, tachycardia with a heart rate of 105, tachypnea  respiratory rate of 28 but otherwise normal work of breathing, normal sats, and clear lungs, abdomen is soft with no tenderness, rebound or guarding.  Presentation concerning for sepsis from PNA, colitis, C. Diff, urosepsis versus viral illness such as Covid or influenza.  Will get labs, blood cultures, lactate, chest x-ray, urinalysis, C. Difficile, Covid and flu, procalcitonin, urinalysis and urine culture.  Will give Tylenol, IV fluids.  Old medical records reviewed.  EKG with no acute ischemic changes.  Patient placed on telemetry for close monitoring.     _________________________ 7:12 AM on 10/05/2019 -----------------------------------------  No leukocytosis. Lactic 2.2. Other Labs  pending. Care transferred to Dr. Kerman Passey  _____________________________________________ Please note:  Patient was evaluated in Emergency Department today for the symptoms described in the history of present illness. Patient was evaluated in the context of the global COVID-19 pandemic, which necessitated consideration that the patient might be at risk for infection with the SARS-CoV-2 virus that causes COVID-19. Institutional protocols and algorithms that pertain to the evaluation of patients at risk for COVID-19 are in a state of rapid change based on information released by regulatory bodies including the CDC and federal and state organizations. These policies and algorithms were followed during the patient's care in the ED.  Some ED evaluations and interventions may be delayed as a result of limited staffing during the pandemic.   [place] Controlled Substance Database was reviewed by me. ____________________________________________   FINAL CLINICAL IMPRESSION(S) / ED DIAGNOSES   Final diagnoses:  Fever, unspecified fever cause  Diarrhea of presumed infectious origin      NEW MEDICATIONS STARTED DURING THIS VISIT:  ED Discharge Orders    None       Note:  This document was prepared using Dragon voice recognition software and may include unintentional dictation errors.    Alfred Levins, [place], MD 10/05/19 [phone]

## 2019-10-05 NOTE — ED Notes (Signed)
Dr. Kerman Passey informed patient has Norovirus

## 2019-10-05 NOTE — ED Provider Notes (Addendum)
-----------------------------------------   8:37 AM on 10/05/2019 -----------------------------------------  Patient's labs have resulted showing a slight lactate elevation to 2.2.  Patient remains febrile and tachycardic meeting sepsis criteria.  Negative Covid and flu swab.  We will start the patient on broad-spectrum antibiotics.  Patient does have diffuse abdominal tenderness, we will obtain CT imaging abdomen/pelvis to rule out intra-abdominal pathology.  Given the patient's fever and diarrhea have also added on a stool antigen test looking for a possible viral or bacterial cause of the diarrhea and fever.  We will likely admit to the hospitalist service as the patient meets sepsis criteria with a fever of unknown origin.  After a fairly extensive work-up patient CT scan has resulted negative besides significant amount of fluid-filled intestines consistent with diarrheal illness.  Patient's stool studies have resulted positive for norovirus.  This is likely the patient's source of her discomfort and diarrhea.  I believe the patient is safe for discharge home with supportive care.  We will discharge with Zofran as needed.  I discussed with the patient use of loperamide after 24 hours of diarrhea.  As well as fluids.  Patient agreeable to plan.  Patient is feeling very weak on attempted discharge.  Blood pressure in the 90s.  We will IV hydrate.  We will admit to the hospitalist service for supportive care.   Harvest Dark, MD 10/05/19 1205    Harvest Dark, MD 10/05/19 1216

## 2019-10-05 NOTE — ED Notes (Signed)
Patient given graham crackers and ginger ale. Per Dr. Blaine Hamper message patient is ok to eat

## 2019-10-05 NOTE — Progress Notes (Signed)
PHARMACY -  BRIEF ANTIBIOTIC NOTE   Pharmacy has received consult(s) for vancomycin and cefepime from an ED provider.  The patient's profile has been reviewed for ht/wt/allergies/indication/available labs.    One time order(s) placed for vanc 1 g + cefepime 2 g  Further antibiotics/pharmacy consults should be ordered by admitting physician if indicated.                       Thank you,  Tawnya Crook, PharmD 10/05/2019  8:37 AM

## 2019-10-05 NOTE — ED Notes (Signed)
Patient reported to CT staff that she had taken a tizanidine this morning from her pocketbook at 0900. Husband took the medications and the pocketbook home

## 2019-10-05 NOTE — ED Notes (Signed)
Patient has been ambulating up and down to the bathroom without assistance and attempting to put herself back on monitor

## 2019-10-05 NOTE — H&P (Signed)
History and Physical    [name] [id number] DOB: [birth year] DOA: 10/05/2019  Referring MD/NP/PA:   PCP: [facility]   Patient coming from:  The patient is coming from home.  At baseline, pt is independent for most of ADL.        Chief Complaint: Diarrhea and abdominal pain   HPI: Ann Bowen is a 64 y.o. female with medical history significant of depression with anxiety, borderline personality disorder, fibromyalgia, who presents with diarrhea and abdominal pain.  Patient symptoms started last night at about 8 PM.  She has been having more than 10 times of watery diarrhea and abdominal pain.  Her abdominal pain is diffuse, constant, cramping, 8 out of 10 severity, nonradiating.  Patient also has fever and chills.  Denies nausea vomiting.  No symptoms of UTI or unilateral weakness.  She has body aches. Patient has hypotension with blood pressure of 85/48, which improved to 97/48 after giving 4L of IV fluid in ED.  ED Course: pt was found to have negative C. difficile PCR, GI pathogen panel positive for norovirus.  WBC 7.5, lactic acid 2.2, negative urinalysis, negative Covid PCR, electrolytes renal function okay, temperature 102.9 --> 97.1, tachycardia, tachypnea, oxygen saturation 96% on room air.  Chest x-ray negative.  CT abdomen/pelvis findings are consistent with diarrhea.  Patient is placed on progressive bed for observation.  Review of Systems:   General: has fevers, chills, no body weight gain, has poor appetite, has fatigue HEENT: no blurry vision, hearing changes or sore throat Respiratory: no dyspnea, coughing, wheezing CV: no chest pain, no palpitations GI: no nausea, vomiting, has abdominal pain, diarrhea, no constipation GU: no dysuria, burning on urination, increased urinary frequency, hematuria  Ext: no leg edema Neuro: no unilateral weakness, numbness, or tingling, no vision change or hearing loss Skin: no rash, no skin tear. MSK: No muscle  spasm, no deformity, no limitation of range of movement in spin Heme: No easy bruising.  Travel history: No recent long distant travel.  Allergy:  Allergies  Allergen Reactions  . Wellbutrin [Bupropion] Hives and Rash    Past Medical History:  Diagnosis Date  . Arthritis   . Borderline personality disorder ([place])   . Depression   . Fibromyalgia   . Shingles     Past Surgical History:  Procedure Laterality Date  . CHOLECYSTECTOMY    . COLONOSCOPY WITH PROPOFOL N/A 01/01/2018   Procedure: COLONOSCOPY WITH PROPOFOL;  Surgeon: Lin Landsman, MD;  Location: [facility] ENDOSCOPY;  Service: Gastroenterology;  Laterality: N/A;  . GASTRIC BYPASS    . SHOULDER SURGERY      Social History:  reports that she has never smoked. She has never used smokeless tobacco. She reports current alcohol use. She reports that she does not use drugs.  Family History:  Family History  Problem Relation Age of Onset  . Diabetes Mellitus II Mother   . Diabetes Mellitus II Father   . Hypertension Father   . Diabetes Mellitus II Sister      Prior to Admission medications   Medication Sig Start Date End Date Taking? Authorizing Provider  clonazePAM (KLONOPIN) 1 MG tablet Take 1 mg by mouth 2 (two) times daily.   Yes [provider]  diclofenac (VOLTAREN) 75 MG EC tablet Take by mouth.   Yes [provider]  doxepin (SINEQUAN) 10 MG capsule 10 MG capsule Take 20 mg by mouth nightly 09/18/19  Yes [provider]  EPINEPHrine 0.3 mg/0.3 mL IJ  SOAJ injection Inject into the muscle.   Yes [provider]  gabapentin (NEURONTIN) 300 MG capsule Take 300 mg by mouth 3 (three) times daily.   Yes [provider]  Multiple Vitamin (MULTI-VITAMIN) tablet Take by mouth.   Yes [provider]  phentermine 30 MG capsule Take by mouth.   Yes [provider]  rOPINIRole (REQUIP) 1 MG tablet Take 1 mg by mouth in the morning and at bedtime.    Yes [provider]  tiZANidine (ZANAFLEX) 4 MG capsule Take 4 mg by mouth 3 (three) times daily.    Yes [provider]  loperamide (IMODIUM A-D) 2 MG tablet Take 1 tablet (2 mg total) by mouth 4 (four) times daily as needed for diarrhea or loose stools. 10/05/19   Harvest Dark, MD  ondansetron (ZOFRAN) 4 MG tablet Take 1 tablet (4 mg total) by mouth daily as needed for nausea or vomiting. 10/05/19   Harvest Dark, MD    Physical Exam: Vitals:   10/05/19 1000 10/05/19 1015 10/05/19 1029 10/05/19 1132  BP: (!) 90/47 (!) 97/48    Pulse: 65 68    Resp: 20 19    Temp:   98.3 F (36.8 C) (!) 97.1 F (36.2 C)  TempSrc:   Oral Oral  SpO2: 95% 96%    Weight:      Height:       General: Not in acute distress.  Has dry mucous membrane HEENT:       Eyes: PERRL, EOMI, no scleral icterus.       ENT: No discharge from the ears and nose, no pharynx injection, no tonsillar enlargement.        Neck: No JVD, no bruit, no mass felt. Heme: No neck lymph node enlargement. Cardiac: S1/S2, RRR, No murmurs, No gallops or rubs. Respiratory: No rales, wheezing, rhonchi or rubs. GI: Soft, nondistended, has diffused tenderness, no rebound pain, no organomegaly, BS present. GU: No hematuria Ext: No pitting leg edema bilaterally. 2+DP/PT pulse bilaterally. Musculoskeletal: No joint deformities, No joint redness or warmth, no limitation of ROM in spin. Skin: No rashes.  Neuro: Alert, oriented X3, cranial nerves II-XII grossly intact, moves all extremities normally.   Psych: Patient is not psychotic, no suicidal or hemocidal ideation.  Labs on Admission: I have personally reviewed following labs and imaging studies  CBC: Recent Labs  Lab 10/05/19 0619  WBC 7.5  NEUTROABS 6.9  HGB 12.9  HCT 39.4  MCV 85.8  PLT [id number]   Basic Metabolic Panel: Recent Labs  Lab 10/05/19 0619  NA 136  K 3.7  CL 104  CO2 21*  GLUCOSE 140*  BUN 16  CREATININE 0.86  CALCIUM 8.3*   GFR: Estimated  Creatinine Clearance: 80.1 mL/min (by C-G formula based on SCr of 0.86 mg/dL). Liver Function Tests: Recent Labs  Lab 10/05/19 0619  AST 23  ALT 15  ALKPHOS 91  BILITOT 1.1  PROT 7.4  ALBUMIN 3.9   No results for input(s): LIPASE, AMYLASE in the last 168 hours. No results for input(s): AMMONIA in the last 168 hours. Coagulation Profile: No results for input(s): INR, PROTIME in the last 168 hours. Cardiac Enzymes: No results for input(s): CKTOTAL, CKMB, CKMBINDEX, TROPONINI in the last 168 hours. BNP (last 3 results) No results for input(s): PROBNP in the last 8760 hours. HbA1C: No results for input(s): HGBA1C in the last 72 hours. CBG: No results for input(s): GLUCAP in the last 168 hours. Lipid Profile: No  results for input(s): CHOL, HDL, LDLCALC, TRIG, CHOLHDL, LDLDIRECT in the last 72 hours. Thyroid Function Tests: No results for input(s): TSH, T4TOTAL, FREET4, T3FREE, THYROIDAB in the last 72 hours. Anemia Panel: No results for input(s): VITAMINB12, FOLATE, FERRITIN, TIBC, IRON, RETICCTPCT in the last 72 hours. Urine analysis:    Component Value Date/Time   COLORURINE STRAW (A) 10/05/2019 0620   APPEARANCEUR CLEAR (A) 10/05/2019 0620   APPEARANCEUR CLEAR 02/05/2014 1325   LABSPEC 1.011 10/05/2019 0620   LABSPEC 1.030 02/05/2014 1325   PHURINE 5.0 10/05/2019 0620   GLUCOSEU NEGATIVE 10/05/2019 0620   GLUCOSEU NEGATIVE 02/05/2014 1325   HGBUR MODERATE (A) 10/05/2019 0620   BILIRUBINUR NEGATIVE 10/05/2019 0620   BILIRUBINUR NEGATIVE 02/05/2014 1325   KETONESUR NEGATIVE 10/05/2019 0620   PROTEINUR NEGATIVE 10/05/2019 0620   NITRITE NEGATIVE 10/05/2019 0620   LEUKOCYTESUR NEGATIVE 10/05/2019 0620   LEUKOCYTESUR 3+ 02/05/2014 1325   Sepsis Labs: @LABRCNTIP (procalcitonin:4,lacticidven:4) ) Recent Results (from the past 240 hour(s))  SARS Coronavirus 2 by RT PCR (hospital order, performed in [facility] hospital lab) Nasopharyngeal Nasopharyngeal Swab     Status:  None   Collection Time: 10/05/19  6:19 AM   Specimen: Nasopharyngeal Swab  Result Value Ref Range Status   SARS Coronavirus 2 NEGATIVE NEGATIVE Final    Comment: (NOTE) SARS-CoV-2 target nucleic acids are NOT DETECTED. The SARS-CoV-2 RNA is generally detectable in upper and lower respiratory specimens during the acute phase of infection. The lowest concentration of SARS-CoV-2 viral copies this assay can detect is 250 copies / mL. A negative result does not preclude SARS-CoV-2 infection and should not be used as the sole basis for treatment or other patient management decisions.  A negative result may occur with improper specimen collection / handling, submission of specimen other than nasopharyngeal swab, presence of viral mutation(s) within the areas targeted by this assay, and inadequate number of viral copies (<250 copies / mL). A negative result must be combined with clinical observations, patient history, and epidemiological information. Fact Sheet for Patients:   StrictlyIdeas.no Fact Sheet for Healthcare Providers: BankingDealers.co.za This test is not yet approved or cleared  by the [place] FDA and has been authorized for detection and/or diagnosis of SARS-CoV-2 by FDA under an Emergency Use Authorization (EUA).  This EUA will remain in effect (meaning this test can be used) for the duration of the COVID-19 declaration under Section 564(b)(1) of the Act, 21 U.S.C. section 360bbb-3(b)(1), unless the authorization is terminated or revoked sooner. Performed at [facility], [place], [place] [address]   C Difficile Quick Screen w PCR reflex     Status: None   Collection Time: 10/05/19  6:20 AM   Specimen: Stool  Result Value Ref Range Status   C Diff antigen NEGATIVE NEGATIVE Final   C Diff toxin NEGATIVE NEGATIVE Final   C Diff interpretation No C. difficile detected.  Final    Comment: Performed  at [facility], [address] [place], [place] [address]  Gastrointestinal Panel by PCR , Stool     Status: Abnormal   Collection Time: 10/05/19  6:20 AM   Specimen: Stool  Result Value Ref Range Status   Campylobacter species NOT DETECTED NOT DETECTED Final   Plesimonas shigelloides NOT DETECTED NOT DETECTED Final   Salmonella species NOT DETECTED NOT DETECTED Final   Yersinia enterocolitica NOT DETECTED NOT DETECTED Final   Vibrio species NOT DETECTED NOT DETECTED Final   Vibrio cholerae NOT DETECTED NOT DETECTED  Final   Enteroaggregative E coli (EAEC) NOT DETECTED NOT DETECTED Final   Enteropathogenic E coli (EPEC) NOT DETECTED NOT DETECTED Final   Enterotoxigenic E coli (ETEC) NOT DETECTED NOT DETECTED Final   Shiga like toxin producing E coli (STEC) NOT DETECTED NOT DETECTED Final   Shigella/Enteroinvasive E coli (EIEC) NOT DETECTED NOT DETECTED Final   Cryptosporidium NOT DETECTED NOT DETECTED Final   Cyclospora cayetanensis NOT DETECTED NOT DETECTED Final   Entamoeba histolytica NOT DETECTED NOT DETECTED Final   Giardia lamblia NOT DETECTED NOT DETECTED Final   Adenovirus F40/41 NOT DETECTED NOT DETECTED Final   Astrovirus NOT DETECTED NOT DETECTED Final   Norovirus GI/GII DETECTED (A) NOT DETECTED Final    Comment: RESULT CALLED TO, READ BACK BY AND VERIFIED WITH: JADEKA MANGRUM AT 1036 10/05/19.PMF    Rotavirus A NOT DETECTED NOT DETECTED Final   Sapovirus (I, II, IV, and V) NOT DETECTED NOT DETECTED Final    Comment: Performed at [facility], [address] [place], [place] [address]     Radiological Exams on Admission: CT ABDOMEN PELVIS W CONTRAST  Result Date: 10/05/2019 CLINICAL DATA:  Abdominal pain, fevers and diarrhea EXAM: CT ABDOMEN AND PELVIS WITH CONTRAST TECHNIQUE: Multidetector CT imaging of the abdomen and pelvis was performed using the standard protocol following bolus administration of intravenous contrast. CONTRAST:  177mL OMNIPAQUE  IOHEXOL 300 MG/ML  SOLN COMPARISON:  09/04/2016 FINDINGS: Lower chest: No acute abnormality. Hepatobiliary: Fatty infiltration of the liver is noted. The gallbladder has been surgically removed. Pancreas: Fatty interdigitation of the pancreas is noted. No mass lesion is seen. Spleen: Normal in size without focal abnormality. Adrenals/Urinary Tract: Adrenal glands are within normal limits. Kidneys demonstrate a normal enhancement pattern. No renal calculi or obstructive changes are seen. The bladder is partially distended. Stomach/Bowel: Fluid is noted throughout the colon consistent with the diarrheal state. No obstructive changes are seen. The appendix is well visualized and within normal limits. Stomach is decompressed. Postsurgical changes are noted. Small bowel shows fluid throughout consistent with the diarrheal state. Vascular/Lymphatic: Aortic atherosclerosis. No enlarged abdominal or pelvic lymph nodes. Reproductive: Uterus and bilateral adnexa are unremarkable. Other: No abdominal wall hernia or abnormality. No abdominopelvic ascites. Musculoskeletal: No acute or significant osseous findings. IMPRESSION: Fluid throughout the large and small bowel consistent with the diarrheal state. Fatty liver. No other focal abnormality is noted. Electronically Signed   By: Inez Catalina M.D.   On: 10/05/2019 12:00   DG Chest Portable 1 View  Result Date: 10/05/2019 CLINICAL DATA:  Sepsis EXAM: PORTABLE CHEST 1 VIEW COMPARISON:  05/22/2016 FINDINGS: Borderline heart size. Normal aortic contours. There is no edema, consolidation, effusion, or pneumothorax. Artifact from EKG leads. IMPRESSION: Negative for pneumonia. Electronically Signed   By: Monte Fantasia M.D.   On: 10/05/2019 07:22     EKG: Independently reviewed.  Sinus rhythm, QTC 426, low voltage, poor R wave progression  Assessment/Plan Principal Problem:   Diarrhea Active Problems:   Sepsis ([place])   Depression with anxiety   Sepsis due to diarrhea  secondary to norovirus infection: Patient meets criteria for sepsis with hypotension, fever, tachycardia and tachypnea.  Lactic acid elevated at 2.2.  Blood pressure responded to aggressive IV fluid resuscitation.  Blood pressure improved to from 85/48 to 97/48 after total of 4L of IVF.   -will place on progressive bed for observation -IV fluid: 2 L of LR and 2 L of normal saline were given in ED, -Will continue normal saline at  125 cc/h -As needed Zofran and morphine -Patient received 1 dose of cefepime, Flagyl and vancomycin in ED, will not continue antibiotics. -Follow-up blood culture and urine culture which were ordered in ED -will get Procalcitonin and trend lactic acid levels per sepsis protocol.  Depression with anxiety: no SI or HI -continue home Klonopin, doxepin, Requip   DVT ppx:   SQ Lovenox Code Status: Full code Family Communication: not done, no family member is at bed side.   Disposition Plan:  Anticipate discharge back to previous home environment Consults called:  none Admission status:  progressive unit for obs      Status is: Observation  The patient remains OBS appropriate and will d/c before 2 midnights.  Dispo: The patient is from: Home              Anticipated d/c is to: Home              Anticipated d/c date is: 1 day              Patient currently is not medically stable to d/c.          Date of Service 10/05/2019    Ivor Costa [facility]   If 7PM-7AM, please contact night-coverage [email or web address] 10/05/2019, 1:22 PM

## 2019-10-06 DIAGNOSIS — A0811 Acute gastroenteropathy due to Norwalk agent: Secondary | ICD-10-CM | POA: Diagnosis not present

## 2019-10-06 DIAGNOSIS — A419 Sepsis, unspecified organism: Secondary | ICD-10-CM | POA: Diagnosis not present

## 2019-10-06 DIAGNOSIS — A09 Infectious gastroenteritis and colitis, unspecified: Secondary | ICD-10-CM | POA: Diagnosis not present

## 2019-10-06 LAB — URINE CULTURE

## 2019-10-06 LAB — CBC
HCT: 35.7 % — ABNORMAL LOW (ref 36.0–46.0)
Hemoglobin: 11.6 g/dL — ABNORMAL LOW (ref 12.0–15.0)
MCH: 28.2 pg (ref 26.0–34.0)
MCHC: 32.5 g/dL (ref 30.0–36.0)
MCV: 86.9 fL (ref 80.0–100.0)
Platelets: 191 10*3/uL (ref 150–400)
RBC: 4.11 MIL/uL (ref 3.87–5.11)
RDW: 14.4 % (ref 11.5–15.5)
WBC: 3.7 10*3/uL — ABNORMAL LOW (ref 4.0–10.5)
nRBC: 0 % (ref 0.0–0.2)

## 2019-10-06 LAB — BASIC METABOLIC PANEL
Anion gap: 5 (ref 5–15)
BUN: 9 mg/dL (ref 8–23)
CO2: 23 mmol/L (ref 22–32)
Calcium: 7.7 mg/dL — ABNORMAL LOW (ref 8.9–10.3)
Chloride: 113 mmol/L — ABNORMAL HIGH (ref 98–111)
Creatinine, Ser: 0.83 mg/dL (ref 0.44–1.00)
GFR calc Af Amer: >60 mL/min (ref >60)
GFR calc non Af Amer: >60 mL/min (ref >60)
Glucose, Bld: 114 mg/dL — ABNORMAL HIGH (ref 70–99)
Potassium: 3.7 mmol/L (ref 3.5–5.1)
Sodium: 141 mmol/L (ref 135–145)

## 2019-10-06 LAB — HIV ANTIBODY (ROUTINE TESTING W REFLEX): HIV Screen 4th Generation wRfx: NONREACTIVE

## 2019-10-06 LAB — MAGNESIUM: Magnesium: 1.9 mg/dL (ref 1.7–2.4)

## 2019-10-06 LAB — LACTIC ACID, PLASMA: Lactic Acid, Venous: 1.7 mmol/L (ref 0.5–1.9)

## 2019-10-06 LAB — PROCALCITONIN: Procalcitonin: 0.17 ng/mL

## 2019-10-06 NOTE — Discharge Instructions (Signed)
Bloody Diarrhea Bloody diarrhea is frequent loose and watery bowel movements that contain blood. The blood can be hard to see or notice (occult). Bloody diarrhea may be caused by medical conditions such as:  Ulcerative colitis.  Crohn's disease.  Intestinal infection.  Viral gastroenteritis or bacterial gastroenteritis. Finding out why there is blood in your diarrhea is necessary so that your health care provider can prescribe the right treatment for you. Follow the instructions from your health care provider about treating the cause of your bloody diarrhea. Any type of diarrhea can make you feel weak and dehydrated. Dehydration can make you tired and thirsty, cause you to have a dry mouth, and decrease how often you urinate. Follow these instructions at home: Eating and drinking     Follow these recommendations as told by your health care provider:  Take an oral rehydration solution (ORS). This is an over-the-counter medicine that helps return your body to its normal balance of nutrients and water. It is found at pharmacies and retail stores.  Drink enough fluid to keep your urine pale yellow. ? Drink fluids such as water, ice chips, diluted fruit juice, and low-calorie sports drinks. You can also drink milk products, if desired. ? Avoid drinking fluids that contain a lot of sugar or caffeine, such as energy drinks, regular sports drinks, and soda. ? Avoid alcohol.  Eat bland, easy-to-digest foods in small amounts as you are able. These foods include bananas, applesauce, rice, lean meats, toast, and crackers.  Avoid spicy or fatty foods.  Medicines  Take over-the-counter and prescription medicines only as told by your health care provider. ? Your health care provider may prescribe medicine to slow down the frequency of diarrhea or to ease stomach discomfort.  If you were prescribed an antibiotic medicine, take it as told by your health care provider. Do not stop using the  antibiotic even if you start to feel better. General instructions   Wash your hands often using soap and water. If soap and water are not available, use a hand sanitizer. Others in the household should wash their hands as well. Hands should be washed: ? After using the toilet or changing a diaper. ? Before preparing, cooking, or serving food. ? While caring for a sick person or while visiting someone in a hospital.  Rest at home while you recover.  Take a warm bath to relieve any burning or pain from frequent diarrhea episodes.  Watch your condition for any changes.  Keep all follow-up visits as told by your health care provider. This is important. Contact a health care provider if:  You have a fever.  Your diarrhea gets worse.  You have new symptoms.  You cannot keep fluids down.  You feel light-headed or dizzy.  You have a headache.  You have muscle cramps. Get help right away if:  You have chest pain.  You feel extremely weak or you faint.  The blood in your diarrhea increases or turns a different color.  You vomit and the vomit is bloody or looks black.  You have persistent diarrhea.  You have severe pain, cramping, or bloating in your abdomen.  You have trouble breathing or you are breathing very quickly.  Your heart is beating very quickly.  Your skin feels cold and clammy.  You feel confused.  You have signs of dehydration, such as: ? Dark urine, very little urine, or no urine. ? Cracked lips. ? Dry mouth. ? Sunken eyes. ? Sleepiness. ? Weakness. Summary  Bloody diarrhea is frequent loose and watery bowel movements that contain blood. The blood can be hard to see or notice (occult).  Follow the instructions from your health care provider about treating the cause of your bloody diarrhea.  Any type of diarrhea can make you feel weak and dehydrated.  Follow your health care provider's recommendations for eating and drinking and for taking  medicines.  Contact your health care provider if your symptoms get worse. Get help right away if you have signs of dehydration. This information is not intended to replace advice given to you by your health care provider. Make sure you discuss any questions you have with your health care provider. Document Revised: 10/12/2017 Document Reviewed: 10/12/2017 Elsevier Patient Education  [place].   Chronic Diarrhea Chronic diarrhea is a condition in which a person passes frequent loose and watery stools for 4 weeks or longer. Non-chronic diarrhea usually lasts for only 2-3 days. Diarrhea can cause a person to feel weak and dehydrated. Dehydration can make the person tired and thirsty. It can also cause a dry mouth, decreased urination, and dark yellow urine. Diarrhea is a sign of an underlying problem, such as:  Infection.  Side effects of medicines.  Problems digesting something in your diet, such as milk products if you have lactose intolerance.  Conditions such as celiac disease, irritable bowel syndrome (IBS), or inflammatory bowel disease (IBD). If you have chronic diarrhea, make sure you treat it as told by your health care provider. Follow these instructions at home: Medicines  Take over-the-counter and prescription medicines only as told by your health care provider.  If you were prescribed an antibiotic medicine, take it as told by your health care provider. Do not stop taking the antibiotic even if you start to feel better. Eating and drinking   Follow instructions from your health care provider about what to eat and drink. You may have to: ? Avoid foods that trigger diarrhea for you. ? Take an oral rehydration solution (ORS). This is a drink that keeps you hydrated. It can be found at pharmacies and retail stores. ? Drink clear fluids, such as water, diluted fruit juice, and low-calorie sports drinks. You can also get fluids by sucking on ice chips. ? Drink enough  fluid to keep your urine pale yellow. This will help you avoid dehydration. ? Eat small amounts of bland foods that are easy to digest as you are able. These foods include bananas, applesauce, rice, lean meats, toast, and crackers. ? Avoid spicy or fatty foods. ? Avoid foods and beverages that contain a lot of sugar or caffeine.  Do not drink alcohol if: ? Your health care provider tells you not to drink. ? You are pregnant, may be pregnant, or are planning to become pregnant.  If you drink alcohol: ? Limit how much you use to:  0-1 drink a day for women.  0-2 drinks a day for men. ? Be aware of how much alcohol is in your drink. In the [place] one drink equals one 12 oz bottle of beer (355 mL), one 5 oz glass of wine (148 mL), or one 1 oz glass of hard liquor (44 mL). General instructions   Wash your hands often and after each diarrhea episode. Use soap and water. If soap and water are not available, use hand sanitizer.  Make sure that all people in your household wash their hands well and often.  Rest as told by your health care provider.  Watch  your condition for any changes.  Take a warm bath to relieve any burning or pain from frequent diarrhea episodes.  Keep all follow-up visits as told by your health care provider. This is important. Contact a health care provider if:  You have a fever.  Your diarrhea gets worse or does not get better.  You have new symptoms.  You cannot drink fluid without vomiting.  You feel light-headed or dizzy.  You have a headache.  You have muscle cramps.  You have severe pain in the rectum. Get help right away if:  You have vomiting that does not go away.  You have chest pain.  You feel very weak or you faint.  You have bloody or black stools, or stools that look like tar.  You have severe pain, cramping, or bloating in your abdomen, or pain that stays in one place.  You have trouble breathing or you are breathing very  quickly.  Your heart is beating very quickly.  Your skin feels cold and clammy.  You feel confused.  You have a severe headache.  You have signs or symptoms of dehydration, such as: ? Dark urine, very little urine, or no urine. ? Cracked lips. ? Dry mouth. ? Sunken eyes. ? Sleepiness. ? Weakness. These symptoms may represent a serious problem that is an emergency. Do not wait to see if the symptoms will go away. Get medical help right away. Call your local emergency services (911 in the [place] Do not drive yourself to the hospital. Summary  Chronic diarrhea is a condition in which a person passes frequent loose and watery stools for 4 weeks or longer.  Diarrhea is a sign of an underlying problem.  Make sure you treat your diarrhea as told by your health care provider.  Drink enough fluid to keep your urine pale yellow. This will help you avoid dehydration.  Wash your hands often and after each diarrhea episode. If soap and water are not available, use hand sanitizer. This information is not intended to replace advice given to you by your health care provider. Make sure you discuss any questions you have with your health care provider. Document Revised: 10/29/2018 Document Reviewed: 10/29/2018 Elsevier Patient Education  2020 [place] yourself hydrated

## 2019-10-06 NOTE — Discharge Summary (Signed)
[facility] NAME: Ann Bowen    MR#:  [id number]  DATE OF BIRTH:  1956/02/11  DATE OF ADMISSION:  10/05/2019 ADMITTING PHYSICIAN: Ann Costa, MD  DATE OF DISCHARGE: 10/06/2019  PRIMARY CARE PHYSICIAN: [facility] [place]    ADMISSION DIAGNOSIS:  Diarrhea of presumed infectious origin [R19.7] Diarrhea [R19.7] Norovirus [A08.11] Fever, unspecified fever cause [R50.9]  DISCHARGE DIAGNOSIS:  Norovirus Gastroenteritis Sepsis resolved SECONDARY DIAGNOSIS:   Past Medical History:  Diagnosis Date  . Arthritis   . Borderline personality disorder ([place])   . Depression   . Fibromyalgia   . Shingles     HOSPITAL COURSE:  Ann Bowen is a 64 y.o. female with medical history significant of depression with anxiety, borderline personality disorder, fibromyalgia, who presents with diarrhea and abdominal pain. Patient symptoms started last night at about 8 PM.  She has been having more than 10 times of watery diarrhea and abdominal pain  Sepsis due to diarrhea secondary to norovirus infection: POA -Patient meets criteria for sepsis with hypotension, fever, tachycardia and tachypnea.  Lactic acid elevated at 2.2--down to 1.7 - Blood pressure responded to aggressive IV fluid resuscitation.   -pt feels much better. No N/V. Diarrhea slowing down -tolerating po -BC neg -GI stool PCR--norovirus, C diff negative  Depression with anxiety:  -continue home Klonopin, doxepin, Requip  Pt overall feeling better. She is requesting to go home  DVT ppx:   SQ Lovenox Code Status: Full code Family Communication:   family member  at bed side.   Disposition Plan:  Anticipate discharge back to previous home environment Consults called:  none Admission status: obs  Status is: Observation patient remains OBS appropriate and will d/c before 2 midnights.  Dispo: The patient is from: Home  Anticipated d/c is to:  Home  Anticipated d/c date is: today  Patient currently is medically stable to d/c.   CONSULTS OBTAINED:    DRUG ALLERGIES:   Allergies  Allergen Reactions  . Wellbutrin [Bupropion] Hives and Rash    DISCHARGE MEDICATIONS:   Allergies as of 10/06/2019      Reactions   Wellbutrin [bupropion] Hives, Rash      Medication List    TAKE these medications   clonazePAM 1 MG tablet Commonly known as: KLONOPIN Take 1 mg by mouth 2 (two) times daily.   diclofenac 75 MG EC tablet Commonly known as: VOLTAREN Take by mouth.   doxepin 10 MG capsule Commonly known as: SINEQUAN 10 MG capsule Take 20 mg by mouth nightly   EPINEPHrine 0.3 mg/0.3 mL Soaj injection Commonly known as: EPI-PEN Inject into the muscle.   gabapentin 300 MG capsule Commonly known as: NEURONTIN Take 300 mg by mouth 3 (three) times daily.   Multi-Vitamin tablet Take by mouth.   ondansetron 4 MG tablet Commonly known as: Zofran Take 1 tablet (4 mg total) by mouth daily as needed for nausea or vomiting.   phentermine 30 MG capsule Take by mouth.   rOPINIRole 1 MG tablet Commonly known as: REQUIP Take 1 mg by mouth in the morning and at bedtime.   tiZANidine 4 MG capsule Commonly known as: ZANAFLEX Take 4 mg by mouth 3 (three) times daily.       If you experience worsening of your admission symptoms, develop shortness of breath, life threatening emergency, suicidal or homicidal thoughts you must seek medical attention immediately by calling 911 or calling your MD immediately  if symptoms less severe.  You Must read complete instructions/literature along with all the possible adverse reactions/side effects for all the Medicines you take and that have been prescribed to you. Take any new Medicines after you have completely understood and accept all the possible adverse reactions/side effects.   Please note  You were cared for by a hospitalist during your hospital stay. If  you have any questions about your discharge medications or the care you received while you were in the hospital after you are discharged, you can call the unit and asked to speak with the hospitalist on call if the hospitalist that took care of you is not available. Once you are discharged, your primary care physician will handle any further medical issues. Please note that NO REFILLS for any discharge medications will be authorized once you are discharged, as it is imperative that you return to your primary care physician (or establish a relationship with a primary care physician if you do not have one) for your aftercare needs so that they can reassess your need for medications and monitor your lab values. Today   SUBJECTIVE   I feel a lot better than Friday--can I please go home? No vomiting Diarrhea slowed down  VITAL SIGNS:  Blood pressure 113/61, pulse 69, temperature 98.2 F (36.8 C), resp. rate (!) 22, height 5\' 3"  (1.6 m), weight 113.4 kg, SpO2 94 %.  I/O:    Intake/Output Summary (Last 24 hours) at 10/06/2019 0958 Last data filed at 10/05/2019 2300 Gross per 24 hour  Intake 1383.26 ml  Output --  Net 1383.26 ml    PHYSICAL EXAMINATION:  GENERAL:  64 y.o.-year-old patient lying in the bed with no acute distress.  EYES: Pupils equal, round, reactive to light and accommodation. No scleral icterus.  HEENT: Head atraumatic, normocephalic. Oropharynx and nasopharynx clear.  NECK:  Supple, no jugular venous distention. No thyroid enlargement, no tenderness.  LUNGS: Normal breath sounds bilaterally, no wheezing, rales,rhonchi or crepitation. No use of accessory muscles of respiration.  CARDIOVASCULAR: S1, S2 normal. No murmurs, rubs, or gallops.  ABDOMEN: Soft, non-tender, non-distended. Bowel sounds present. No organomegaly or mass.  EXTREMITIES: No pedal edema, cyanosis, or clubbing.  NEUROLOGIC: Cranial nerves II through XII are intact. Muscle strength 5/5 in all extremities.  Sensation intact. Gait not checked.  PSYCHIATRIC: The patient is alert and oriented x 3.  SKIN: No obvious rash, lesion, or ulcer.   DATA REVIEW:   CBC  Recent Labs  Lab 10/06/19 0523  WBC 3.7*  HGB 11.6*  HCT 35.7*  PLT 191    Chemistries  Recent Labs  Lab 10/05/19 0619 10/05/19 0619 10/05/19 2325 10/06/19 0523  NA 136   < >  --  141  K 3.7   < >  --  3.7  CL 104   < >  --  113*  CO2 21*   < >  --  23  GLUCOSE 140*   < >  --  114*  BUN 16   < >  --  9  CREATININE 0.86   < >  --  0.83  CALCIUM 8.3*   < >  --  7.7*  MG  --   --  1.9  --   AST 23  --   --   --   ALT 15  --   --   --   ALKPHOS 91  --   --   --   BILITOT 1.1  --   --   --    < > =  values in this interval not displayed.    Microbiology Results   Recent Results (from the past 240 hour(s))  SARS Coronavirus 2 by RT PCR (hospital order, performed in [facility] hospital lab) Nasopharyngeal Nasopharyngeal Swab     Status: None   Collection Time: 10/05/19  6:19 AM   Specimen: Nasopharyngeal Swab  Result Value Ref Range Status   SARS Coronavirus 2 NEGATIVE NEGATIVE Final    Comment: (NOTE) SARS-CoV-2 target nucleic acids are NOT DETECTED. The SARS-CoV-2 RNA is generally detectable in upper and lower respiratory specimens during the acute phase of infection. The lowest concentration of SARS-CoV-2 viral copies this assay can detect is 250 copies / mL. A negative result does not preclude SARS-CoV-2 infection and should not be used as the sole basis for treatment or other patient management decisions.  A negative result may occur with improper specimen collection / handling, submission of specimen other than nasopharyngeal swab, presence of viral mutation(s) within the areas targeted by this assay, and inadequate number of viral copies (<250 copies / mL). A negative result must be combined with clinical observations, patient history, and epidemiological information. Fact Sheet for Patients:    StrictlyIdeas.no Fact Sheet for Healthcare Providers: BankingDealers.co.za This test is not yet approved or cleared  by the [place] FDA and has been authorized for detection and/or diagnosis of SARS-CoV-2 by FDA under an Emergency Use Authorization (EUA).  This EUA will remain in effect (meaning this test can be used) for the duration of the COVID-19 declaration under Section 564(b)(1) of the Act, 21 U.S.C. section 360bbb-3(b)(1), unless the authorization is terminated or revoked sooner. Performed at [facility], [address] [place], [place] [address]   Blood culture (routine x 2)     Status: None (Preliminary result)   Collection Time: 10/05/19  6:20 AM   Specimen: BLOOD  Result Value Ref Range Status   Specimen Description BLOOD BLOOD LEFT FOREARM  Final   Special Requests   Final    BOTTLES DRAWN AEROBIC AND ANAEROBIC Blood Culture results may not be optimal due to an excessive volume of blood received in culture bottles   Culture   Final    NO GROWTH < 24 HOURS Performed at [facility], [address] [place], [place] [address]    Report Status PENDING  Incomplete  Blood culture (routine x 2)     Status: None (Preliminary result)   Collection Time: 10/05/19  6:20 AM   Specimen: BLOOD  Result Value Ref Range Status   Specimen Description BLOOD BLOOD LEFT HAND  Final   Special Requests   Final    BOTTLES DRAWN AEROBIC AND ANAEROBIC Blood Culture adequate volume   Culture   Final    NO GROWTH < 24 HOURS Performed at [facility], [address], [place], [place] [address]    Report Status PENDING  Incomplete  C Difficile Quick Screen w PCR reflex     Status: None   Collection Time: 10/05/19  6:20 AM   Specimen: Stool  Result Value Ref Range Status   C Diff antigen NEGATIVE NEGATIVE Final   C Diff toxin NEGATIVE NEGATIVE Final   C Diff interpretation No C. difficile detected.  Final     Comment: Performed at [facility], [address] [place], [place] [address]  Gastrointestinal Panel by PCR , Stool     Status: Abnormal   Collection Time: 10/05/19  6:20 AM   Specimen: Stool  Result Value  Ref Range Status   Campylobacter species NOT DETECTED NOT DETECTED Final   Plesimonas shigelloides NOT DETECTED NOT DETECTED Final   Salmonella species NOT DETECTED NOT DETECTED Final   Yersinia enterocolitica NOT DETECTED NOT DETECTED Final   Vibrio species NOT DETECTED NOT DETECTED Final   Vibrio cholerae NOT DETECTED NOT DETECTED Final   Enteroaggregative E coli (EAEC) NOT DETECTED NOT DETECTED Final   Enteropathogenic E coli (EPEC) NOT DETECTED NOT DETECTED Final   Enterotoxigenic E coli (ETEC) NOT DETECTED NOT DETECTED Final   Shiga like toxin producing E coli (STEC) NOT DETECTED NOT DETECTED Final   Shigella/Enteroinvasive E coli (EIEC) NOT DETECTED NOT DETECTED Final   Cryptosporidium NOT DETECTED NOT DETECTED Final   Cyclospora cayetanensis NOT DETECTED NOT DETECTED Final   Entamoeba histolytica NOT DETECTED NOT DETECTED Final   Giardia lamblia NOT DETECTED NOT DETECTED Final   Adenovirus F40/41 NOT DETECTED NOT DETECTED Final   Astrovirus NOT DETECTED NOT DETECTED Final   Norovirus GI/GII DETECTED (A) NOT DETECTED Final    Comment: RESULT CALLED TO, READ BACK BY AND VERIFIED WITH: JADEKA MANGRUM AT 1036 10/05/19.PMF    Rotavirus A NOT DETECTED NOT DETECTED Final   Sapovirus (I, II, IV, and V) NOT DETECTED NOT DETECTED Final    Comment: Performed at [facility], [address] [place], [place] [address]    RADIOLOGY:  CT ABDOMEN PELVIS W CONTRAST  Result Date: 10/05/2019 CLINICAL DATA:  Abdominal pain, fevers and diarrhea EXAM: CT ABDOMEN AND PELVIS WITH CONTRAST TECHNIQUE: Multidetector CT imaging of the abdomen and pelvis was performed using the standard protocol following bolus administration of intravenous contrast. CONTRAST:  152mL OMNIPAQUE  IOHEXOL 300 MG/ML  SOLN COMPARISON:  09/04/2016 FINDINGS: Lower chest: No acute abnormality. Hepatobiliary: Fatty infiltration of the liver is noted. The gallbladder has been surgically removed. Pancreas: Fatty interdigitation of the pancreas is noted. No mass lesion is seen. Spleen: Normal in size without focal abnormality. Adrenals/Urinary Tract: Adrenal glands are within normal limits. Kidneys demonstrate a normal enhancement pattern. No renal calculi or obstructive changes are seen. The bladder is partially distended. Stomach/Bowel: Fluid is noted throughout the colon consistent with the diarrheal state. No obstructive changes are seen. The appendix is well visualized and within normal limits. Stomach is decompressed. Postsurgical changes are noted. Small bowel shows fluid throughout consistent with the diarrheal state. Vascular/Lymphatic: Aortic atherosclerosis. No enlarged abdominal or pelvic lymph nodes. Reproductive: Uterus and bilateral adnexa are unremarkable. Other: No abdominal wall hernia or abnormality. No abdominopelvic ascites. Musculoskeletal: No acute or significant osseous findings. IMPRESSION: Fluid throughout the large and small bowel consistent with the diarrheal state. Fatty liver. No other focal abnormality is noted. Electronically Signed   By: Inez Catalina M.D.   On: 10/05/2019 12:00   DG Chest Portable 1 View  Result Date: 10/05/2019 CLINICAL DATA:  Sepsis EXAM: PORTABLE CHEST 1 VIEW COMPARISON:  05/22/2016 FINDINGS: Borderline heart size. Normal aortic contours. There is no edema, consolidation, effusion, or pneumothorax. Artifact from EKG leads. IMPRESSION: Negative for pneumonia. Electronically Signed   By: Monte Fantasia M.D.   On: 10/05/2019 07:22     CODE STATUS:     Code Status Orders  (From admission, onward)         Start     Ordered   10/05/19 1810  Full code  Continuous     10/05/19 1810        Code Status History    Date Active Date Inactive Code Status  Order ID Comments User Context   08/11/2015 0218 08/13/2015 1312 Full Code [id number]  Hildred Priest, MD Inpatient   Advance Care Planning Activity       TOTAL TIME TAKING CARE OF THIS PATIENT: *40* minutes.    Fritzi Mandes M.D  [facility]    CC: Primary care physician; [facility], [facility]

## 2019-10-07 ENCOUNTER — Emergency Department
Admission: EM | Admit: 2019-10-07 | Discharge: 2019-10-07 | Discharge status: Against Medical Advice | Payer: Medicare Other

## 2019-10-07 NOTE — ED Notes (Signed)
Patient cursing at other patient's in waiting area for "talking to fucking loudly on their phone".  Informed patient that language was not appropriate and that the other patient was not speaking loudly on phone.  While moving patient away from patient speaking on phone, patient raised hands and yelled "Stop this wheelchair.  I'm getting the fuck out of here.  You all are fucking useless". Patient stood from chair and ambulated with steady gait, ambulating with purpose.  Left ED.

## 2019-10-10 LAB — CULTURE, BLOOD (ROUTINE X 2)
Culture: NO GROWTH
Culture: NO GROWTH
Special Requests: ADEQUATE

## 2020-08-21 ENCOUNTER — Other Ambulatory Visit: Payer: Self-pay | Admitting: Family Medicine

## 2020-08-21 DIAGNOSIS — Z13820 Encounter for screening for osteoporosis: Secondary | ICD-10-CM

## 2020-08-21 DIAGNOSIS — Z1231 Encounter for screening mammogram for malignant neoplasm of breast: Secondary | ICD-10-CM

## 2020-09-21 ENCOUNTER — Other Ambulatory Visit: Payer: Medicare Other

## 2020-09-21 ENCOUNTER — Ambulatory Visit: Payer: Medicare Other

## 2020-09-29 ENCOUNTER — Inpatient Hospital Stay: Admission: RE | Admit: 2020-09-29 | Payer: Medicare Other | Source: Ambulatory Visit

## 2020-10-29 ENCOUNTER — Ambulatory Visit
Admission: RE | Admit: 2020-10-29 | Discharge: 2020-10-29 | Disposition: A | Discharge status: Home / Self Care | Payer: Medicare Other | Source: Ambulatory Visit | Attending: Family Medicine | Admitting: Family Medicine

## 2020-10-29 ENCOUNTER — Other Ambulatory Visit: Payer: Self-pay

## 2020-10-29 DIAGNOSIS — Z13820 Encounter for screening for osteoporosis: Secondary | ICD-10-CM | POA: Insufficient documentation

## 2020-10-29 DIAGNOSIS — Z780 Asymptomatic menopausal state: Secondary | ICD-10-CM | POA: Insufficient documentation

## 2020-10-29 DIAGNOSIS — Z1231 Encounter for screening mammogram for malignant neoplasm of breast: Secondary | ICD-10-CM | POA: Insufficient documentation

## 2020-10-29 IMAGING — MG MM DIGITAL SCREENING BILAT W/ TOMO AND CAD
8 series · 8 of 24 positions shown · non-contrast
Comparison: Previous exam(s).

CLINICAL DATA: Screening.

EXAM:
DIGITAL SCREENING BILATERAL MAMMOGRAM WITH TOMOSYNTHESIS AND CAD
TECHNIQUE: Bilateral screening digital craniocaudal and mediolateral oblique
mammograms were obtained. Bilateral screening digital breast
tomosynthesis was performed. The images were evaluated with
computer-aided detection.

[R MLO synth-2D]
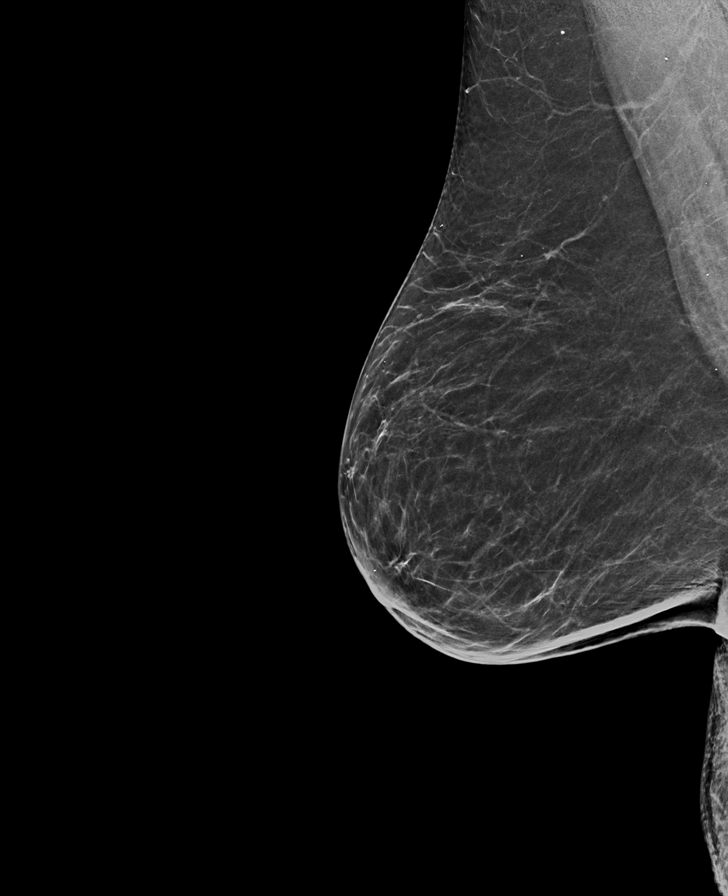

[R CC synth-2D]
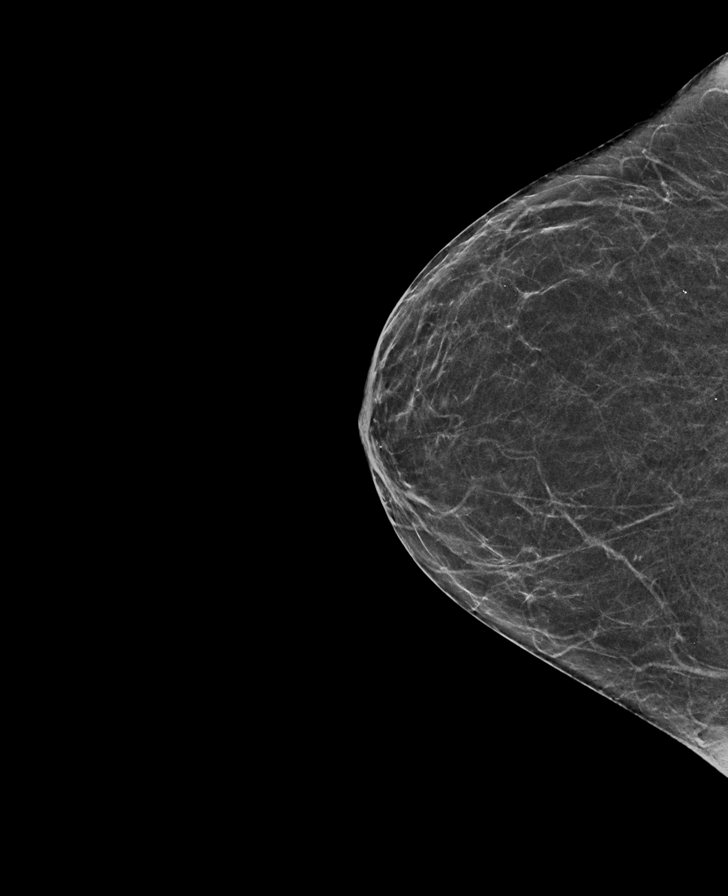

[L MLO synth-2D]
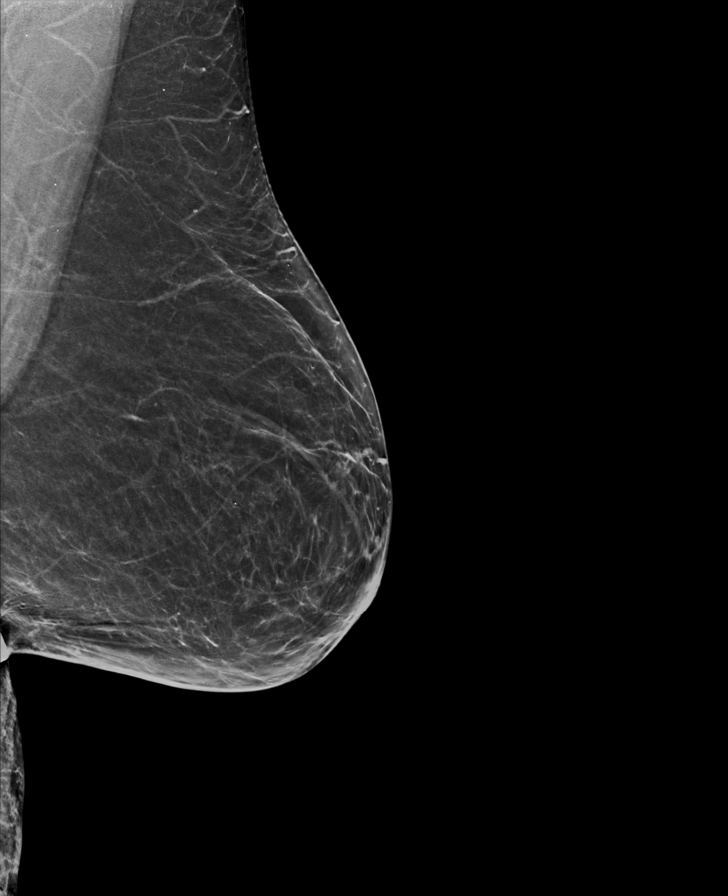

[L CC synth-2D]
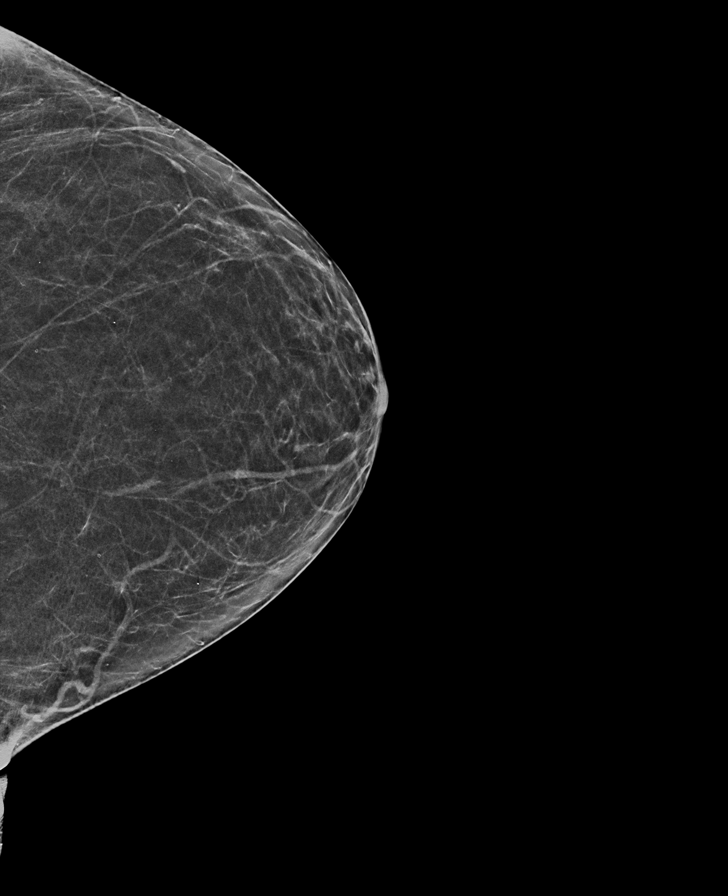

[L CC tomo · tomo slice 28/55.0]
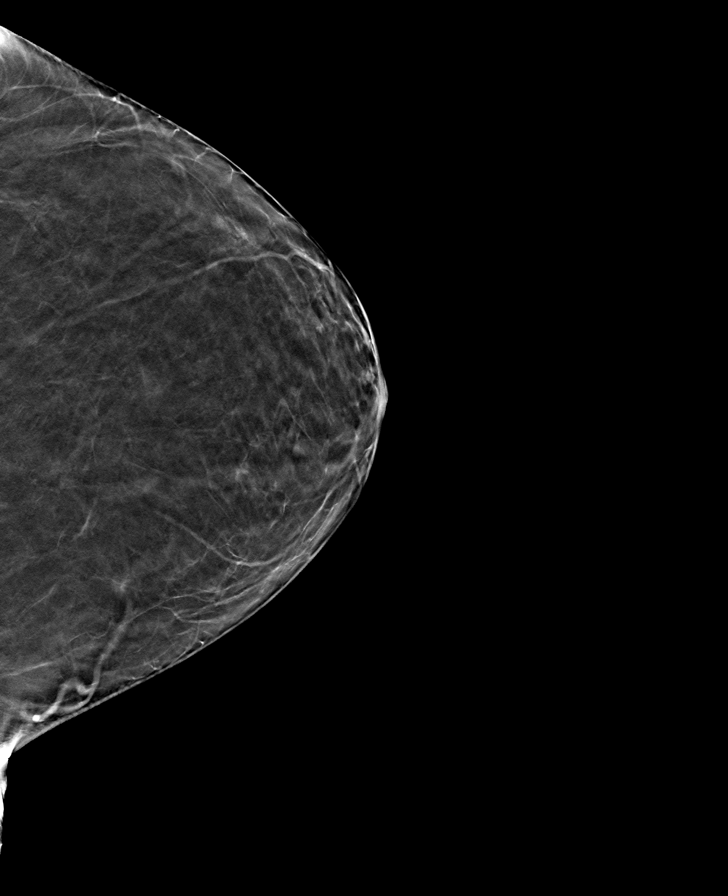

[R MLO tomo · tomo slice 33/65.0]
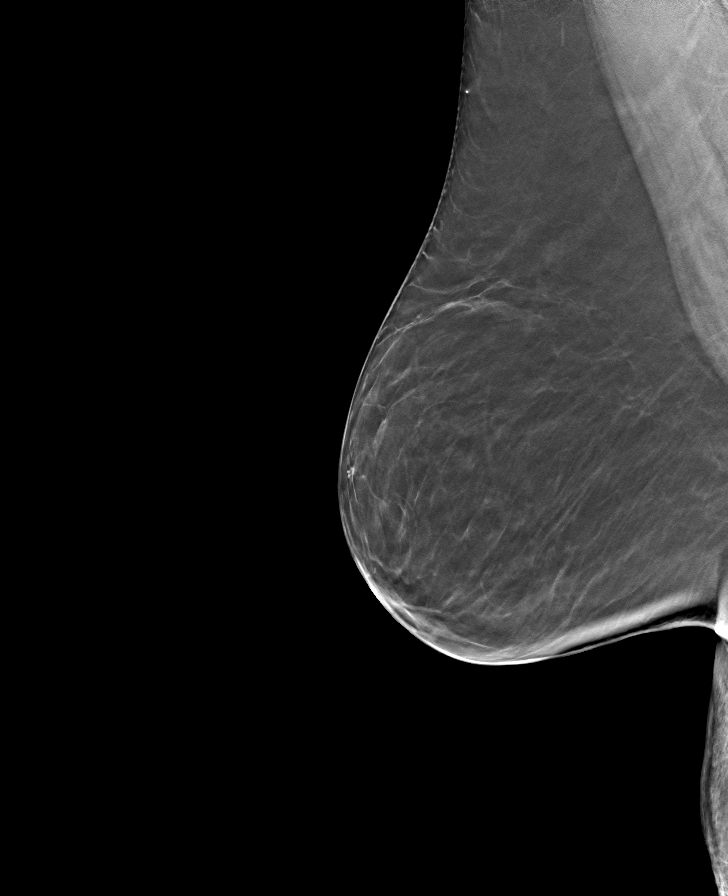

[L MLO tomo · tomo slice 34/67.0]
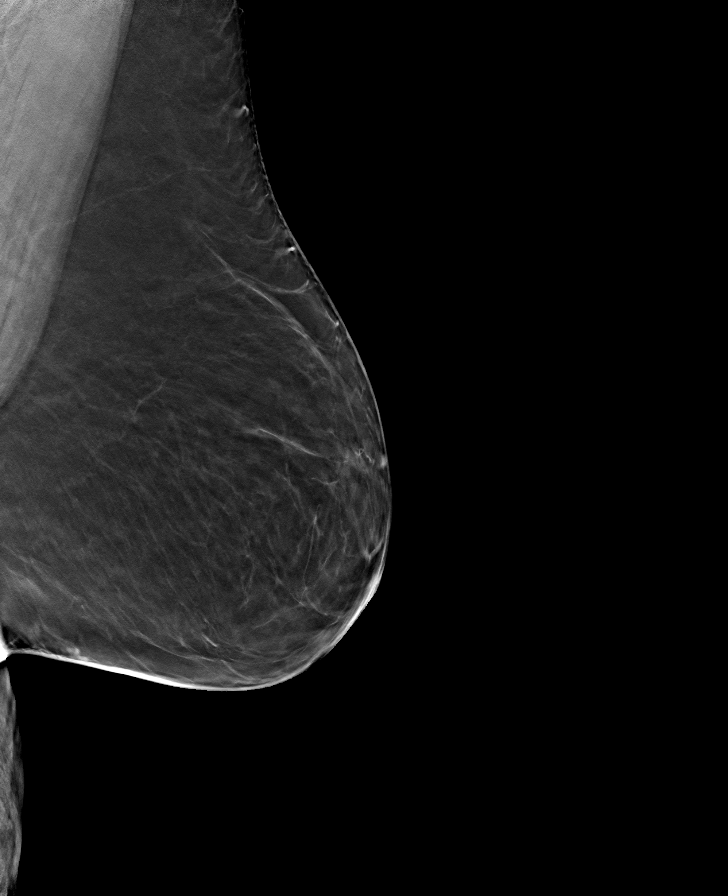

[R CC tomo · tomo slice 28/55.0]
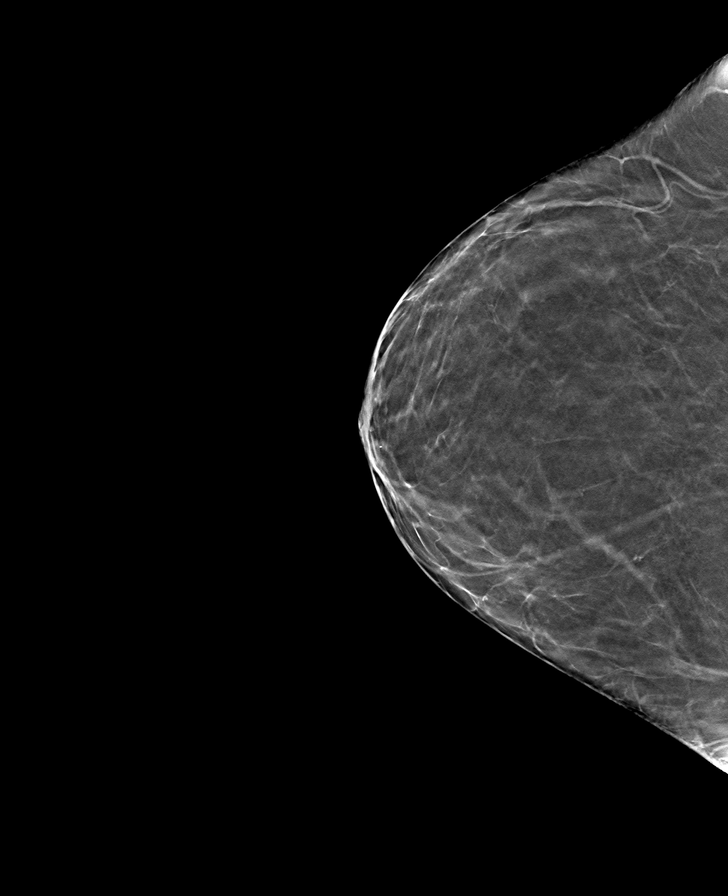

[8 of 24 positions shown; findings below may reference images not displayed]

ACR Breast Density Category b: There are scattered areas of
fibroglandular density.
FINDINGS: There are no findings suspicious for malignancy.
IMPRESSION: No mammographic evidence of malignancy. A result letter of this
screening mammogram will be mailed directly to the patient.

RECOMMENDATION:
Screening mammogram in one year. (Code:[BY])

BI-RADS CATEGORY  1: Negative.

## 2020-11-16 ENCOUNTER — Emergency Department
Admission: EM | Admit: 2020-11-16 | Discharge: 2020-11-16 | Disposition: A | Discharge status: Home / Self Care | Payer: Medicare Other | Attending: Emergency Medicine | Admitting: Emergency Medicine

## 2020-11-16 ENCOUNTER — Other Ambulatory Visit: Payer: Self-pay

## 2020-11-16 DIAGNOSIS — S0501XA Injury of conjunctiva and corneal abrasion without foreign body, right eye, initial encounter: Secondary | ICD-10-CM | POA: Diagnosis not present

## 2020-11-16 DIAGNOSIS — Z79899 Other long term (current) drug therapy: Secondary | ICD-10-CM | POA: Insufficient documentation

## 2020-11-16 DIAGNOSIS — S0591XA Unspecified injury of right eye and orbit, initial encounter: Secondary | ICD-10-CM | POA: Diagnosis present

## 2020-11-16 DIAGNOSIS — X58XXXA Exposure to other specified factors, initial encounter: Secondary | ICD-10-CM | POA: Diagnosis not present

## 2020-11-16 MED ORDER — HYDROCODONE-ACETAMINOPHEN 5-325 MG PO TABS
1.0000 | ORAL_TABLET | Freq: Once | ORAL | Status: AC
  Administered 2020-11-16: 1 via ORAL
  Filled 2020-11-16: qty 1

## 2020-11-16 MED ORDER — ERYTHROMYCIN 5 MG/GM OP OINT
TOPICAL_OINTMENT | Freq: Once | OPHTHALMIC | Status: AC
  Administered 2020-11-16: 1 via OPHTHALMIC
  Filled 2020-11-16: qty 1

## 2020-11-16 MED ORDER — TETRACAINE HCL 0.5 % OP SOLN
2.0000 [drp] | Freq: Once | OPHTHALMIC | Status: AC
  Administered 2020-11-16: 2 [drp] via OPHTHALMIC
  Filled 2020-11-16: qty 4

## 2020-11-16 MED ORDER — HYDROCODONE-ACETAMINOPHEN 5-325 MG PO TABS: 1.0000 | ORAL_TABLET | Freq: Four times a day (QID) | ORAL | 0 refills | Status: DC | PRN

## 2020-11-16 MED ORDER — KETOROLAC TROMETHAMINE 0.5 % OP SOLN
1.0000 [drp] | Freq: Four times a day (QID) | OPHTHALMIC | Status: DC
  Administered 2020-11-16: 1 [drp] via OPHTHALMIC
  Filled 2020-11-16: qty 3

## 2020-11-16 MED ORDER — FLUORESCEIN SODIUM 1 MG OP STRP
1.0000 | ORAL_STRIP | Freq: Once | OPHTHALMIC | Status: AC
  Administered 2020-11-16: 1 via OPHTHALMIC
  Filled 2020-11-16: qty 1

## 2020-11-16 MED ORDER — ERYTHROMYCIN 5 MG/GM OP OINT: TOPICAL_OINTMENT | Freq: Once | OPHTHALMIC | Status: DC

## 2020-11-16 NOTE — ED Provider Notes (Signed)
[facility] Emergency Department Provider Note  ____________________________________________   Event Date/Time   First MD Initiated Contact with Patient 11/16/20 2036     (approximate)  I have reviewed the triage vital signs and the nursing notes.   HISTORY  Chief Complaint Eye Pain (right)    HPI Ann Bowen is a 65 y.o. female presents emergency department complaining of right eye pain.  States she awoke from a nap having pain across the eye.  Unsure if she scratched her eye in her sleep.  Tdap is up-to-date.  Patient does not wear contacts.  Past Medical History:  Diagnosis Date   Arthritis    Borderline personality disorder [facility])    Depression    Fibromyalgia    Shingles     Patient Active Problem List   Diagnosis Date Noted   Diarrhea 10/05/2019   Sepsis ([place]) 10/05/2019   Depression    Depression with anxiety    Norovirus    History of colonic polyps    Borderline personality disorder ([place]) 08/10/2015   MDD (major depressive disorder), recurrent episode ([place]) 08/10/2015   Fibromyalgia 08/10/2015   Osteoarthritis 08/10/2015   Tobacco use disorder 08/10/2015    Past Surgical History:  Procedure Laterality Date   CHOLECYSTECTOMY     COLONOSCOPY WITH PROPOFOL N/A 01/01/2018   Procedure: COLONOSCOPY WITH PROPOFOL;  Surgeon: Lin Landsman, MD;  Location: [place];  Service: Gastroenterology;  Laterality: N/A;   GASTRIC BYPASS     SHOULDER SURGERY      Prior to Admission medications   Medication Sig Start Date End Date Taking? Authorizing Provider  HYDROcodone-acetaminophen (NORCO/VICODIN) 5-325 MG tablet Take 1 tablet by mouth every 6 (six) hours as needed for moderate pain. 11/16/20  Yes Lyall Faciane, Linden Dolin, PA-C  clonazePAM (KLONOPIN) 1 MG tablet Take 1 mg by mouth 2 (two) times daily.    [provider]  diclofenac (VOLTAREN) 75 MG EC tablet Take by mouth.    [provider]  doxepin (SINEQUAN) 10 MG  capsule 10 MG capsule Take 20 mg by mouth nightly 09/18/19   [provider]  EPINEPHrine 0.3 mg/0.3 mL IJ SOAJ injection Inject into the muscle.    [provider]  gabapentin (NEURONTIN) 300 MG capsule Take 300 mg by mouth 3 (three) times daily.    [provider]  Multiple Vitamin (MULTI-VITAMIN) tablet Take by mouth.    [provider]  ondansetron (ZOFRAN) 4 MG tablet Take 1 tablet (4 mg total) by mouth daily as needed for nausea or vomiting. 10/05/19   Harvest Dark, MD  phentermine 30 MG capsule Take by mouth.    [provider]  rOPINIRole (REQUIP) 1 MG tablet Take 1 mg by mouth in the morning and at bedtime.     [provider]  tiZANidine (ZANAFLEX) 4 MG capsule Take 4 mg by mouth 3 (three) times daily.     [provider]    Allergies Wellbutrin [bupropion]  Family History  Problem Relation Age of Onset   Diabetes Mellitus II Mother    Diabetes Mellitus II Father    Hypertension Father    Diabetes Mellitus II Sister    Breast cancer Neg Hx     Social History Social History   Tobacco Use   Smoking status: Never   Smokeless tobacco: Never  Vaping Use   Vaping Use: Never used  Substance Use Topics   Alcohol use: Yes    Comment: occasionaly   Drug  use: Never    Review of Systems  Constitutional: No fever/chills Eyes: No visual changes.  Positive for right eye pain ENT: No sore throat. Respiratory: Denies cough Genitourinary: Negative for dysuria. Musculoskeletal: Negative for back pain. Skin: Negative for rash. Psychiatric: no mood changes,     ____________________________________________   PHYSICAL EXAM:  VITAL SIGNS: ED Triage Vitals  Enc Vitals Group     BP 11/16/20 2003 (!) 132/91     Pulse Rate 11/16/20 2003 (!) 57     Resp 11/16/20 2003 17     Temp 11/16/20 2003 97.6 F (36.4 C)     Temp Source 11/16/20 2003 Oral     SpO2 11/16/20 2003 100 %     Weight 11/16/20 2002 205 lb (93  kg)     Height 11/16/20 2002 5\' 3"  (1.6 m)     Head Circumference --      Peak Flow --      Pain Score 11/16/20 2011 9     Pain Loc --      Pain Edu? --      Excl. in [place]? --     Constitutional: Alert and oriented. Well appearing and in no acute distress. Eyes: Conjunctivae are normal.  Fluorescein stain shows a abrasion across the lower part of the iris Head: Atraumatic. Nose: No congestion/rhinnorhea. Mouth/Throat: Mucous membranes are moist.   Neck:  supple no lymphadenopathy noted Cardiovascular: Normal rate, regular rhythm.  Respiratory: Normal respiratory effort.  No retractions,GU: deferred Musculoskeletal: FROM all extremities, warm and well perfused Neurologic:  Normal speech and language.  Skin:  Skin is warm, dry and intact. No rash noted. Psychiatric: Mood and affect are normal. Speech and behavior are normal.  ____________________________________________   LABS (all labs ordered are listed, but only abnormal results are displayed)  Labs Reviewed - No data to display ____________________________________________   ____________________________________________  RADIOLOGY    ____________________________________________   PROCEDURES  Procedure(s) performed: No  Procedures    ____________________________________________   INITIAL IMPRESSION / ASSESSMENT AND PLAN / ED COURSE  Pertinent labs & imaging results that were available during my care of the patient were reviewed by me and considered in my medical decision making (see chart for details).   Patient is a 65 year old female presents with right eye pain.  See HPI.  Physical exam shows corneal abrasion.  Patient was given ketorolac ophthalmic drops, erythromycin ophthalmic ointment, and Vicodin here in the ED.  She is to follow-up with her regular eye doctor.  Return emergency department worsening.  She was discharged stable condition.     Ann Bowen was evaluated in Emergency Department on  11/16/2020 for the symptoms described in the history of present illness. She was evaluated in the context of the global COVID-19 pandemic, which necessitated consideration that the patient might be at risk for infection with the SARS-CoV-2 virus that causes COVID-19. Institutional protocols and algorithms that pertain to the evaluation of patients at risk for COVID-19 are in a state of rapid change based on information released by regulatory bodies including the CDC and federal and state organizations. These policies and algorithms were followed during the patient's care in the ED.    As part of my medical decision making, I reviewed the following data within the [place] notes reviewed and incorporated, Old chart reviewed, Notes from prior ED visits, and [place] Controlled Substance Database  ____________________________________________   FINAL CLINICAL IMPRESSION(S) / ED DIAGNOSES  Final diagnoses:  Abrasion of right  cornea, initial encounter      NEW MEDICATIONS STARTED DURING THIS VISIT:  New Prescriptions   HYDROCODONE-ACETAMINOPHEN (NORCO/VICODIN) 5-325 MG TABLET    Take 1 tablet by mouth every 6 (six) hours as needed for moderate pain.     Note:  This document was prepared using Dragon voice recognition software and may include unintentional dictation errors.    Versie Starks, PA-C 11/16/20 2125    Nance Pear, MD 11/16/20 [phone]

## 2020-11-16 NOTE — ED Triage Notes (Addendum)
Pt states that she was taking a nap and woke up and her right eye was painful and causing her blurred vision.   Pt hesitant to answer SI/HI questions to start, states history, but over 3 decades ago.

## 2020-11-19 ENCOUNTER — Ambulatory Visit: Payer: Medicare Other | Admitting: Dermatology

## 2020-11-19 ENCOUNTER — Other Ambulatory Visit: Payer: Self-pay

## 2020-11-19 DIAGNOSIS — L821 Other seborrheic keratosis: Secondary | ICD-10-CM

## 2020-11-19 DIAGNOSIS — L578 Other skin changes due to chronic exposure to nonionizing radiation: Secondary | ICD-10-CM

## 2020-11-19 DIAGNOSIS — L659 Nonscarring hair loss, unspecified: Secondary | ICD-10-CM

## 2020-11-19 DIAGNOSIS — T07XXXD Unspecified multiple injuries, subsequent encounter: Secondary | ICD-10-CM

## 2020-11-19 DIAGNOSIS — D1800 Hemangioma unspecified site: Secondary | ICD-10-CM

## 2020-11-19 DIAGNOSIS — W57XXXA Bitten or stung by nonvenomous insect and other nonvenomous arthropods, initial encounter: Secondary | ICD-10-CM | POA: Diagnosis not present

## 2020-11-19 DIAGNOSIS — L689 Hypertrichosis, unspecified: Secondary | ICD-10-CM

## 2020-11-19 DIAGNOSIS — D229 Melanocytic nevi, unspecified: Secondary | ICD-10-CM

## 2020-11-19 DIAGNOSIS — L719 Rosacea, unspecified: Secondary | ICD-10-CM

## 2020-11-19 DIAGNOSIS — L814 Other melanin hyperpigmentation: Secondary | ICD-10-CM

## 2020-11-19 MED ORDER — METRONIDAZOLE 0.75 % EX CREA: TOPICAL_CREAM | Freq: Two times a day (BID) | CUTANEOUS | 3 refills | Status: DC

## 2020-11-19 MED ORDER — BETAMETHASONE DIPROPIONATE 0.05 % EX OINT: TOPICAL_OINTMENT | Freq: Two times a day (BID) | CUTANEOUS | 0 refills | Status: DC | PRN

## 2020-11-19 MED ORDER — BIMATOPROST 0.03 % EX SOLN: 1.0000 "application " | Freq: Every day | CUTANEOUS | 12 refills | Status: DC

## 2020-11-19 NOTE — Progress Notes (Signed)
New Patient Visit  Subjective  Ann Bowen is a 65 y.o. female who presents for the following: Other (Moles on left side that seem darker).  Gets itchy bug bites since she works outside a lot.  Wants treatment of eyebrow thinning/loss which she has had for years.    The following portions of the chart were reviewed this encounter and updated as appropriate:        Review of Systems:  No other skin or systemic complaints except as noted in HPI or Assessment and Plan.  Objective  Well appearing patient in no apparent distress; mood and affect are within normal limits.  All skin waist up examined.  Face Crusted papule of left cheek. Erythema of nose and cheeks.  bilateral eyebrows Eyebrow hair thinning, absent brow lateral, pts' eye color is brown   Assessment & Plan   Lentigines - Scattered tan macules - Due to sun exposure - Benign-appering, observe - Recommend daily broad spectrum sunscreen SPF 30+ to sun-exposed areas, reapply every 2 hours as needed. - Call for any changes  Seborrheic Keratoses - Stuck-on, waxy, tan-brown papules and/or plaques  - Benign-appearing - Discussed benign etiology and prognosis. - Observe - Call for any changes  Melanocytic Nevi - Tan-brown and/or pink-flesh-colored symmetric macules and papules - Benign appearing on exam today - Observation - Call clinic for new or changing moles - Recommend daily use of broad spectrum spf 30+ sunscreen to sun-exposed areas.   Hemangiomas - Red papules - Discussed benign nature - Observe - Call for any changes  Actinic Damage - Chronic condition, secondary to cumulative UV/sun exposure - diffuse scaly erythematous macules with underlying dyspigmentation - Recommend daily broad spectrum sunscreen SPF 30+ to sun-exposed areas, reapply every 2 hours as needed.  - Staying in the shade or wearing long sleeves, sun glasses (UVA+UVB protection) and wide brim hats (4-inch brim around the entire  circumference of the hat) are also recommended for sun protection.  - Call for new or changing lesions.  Skin cancer screening performed today.  Insect bite of multiple sites with local reaction  With Pruritus Recommend insect repellant with higher % DEET ([place] OFF), protective clothing when outdoors  Start Diprolene ointment bid prn to aas prn itch  Topical steroids (such as triamcinolone, fluocinolone, fluocinonide, mometasone, clobetasol, halobetasol, betamethasone, hydrocortisone) can cause thinning and lightening of the skin if they are used for too long in the same area. Your physician has selected the right strength medicine for your problem and area affected on the body. Please use your medication only as directed by your physician to prevent side effects.    betamethasone dipropionate (DIPROLENE) 0.05 % ointment Apply topically 2 (two) times daily as needed (Rash). Avoid face, groin, underarms.  Rosacea Face  Rosacea is a chronic progressive skin condition usually affecting the face of adults, causing redness and/or acne bumps. It is treatable but not curable. It sometimes affects the eyes (ocular rosacea) as well. It may respond to topical and/or systemic medication and can flare with stress, sun exposure, alcohol, exercise and some foods.  Daily application of broad spectrum spf 30+ sunscreen to face is recommended to reduce flares.  Start Metronidazole 0.75% cream bid to mid face  metroNIDAZOLE (METROCREAM) 0.75 % cream - Face Apply topically 2 (two) times daily.  Hypotrichosis bilateral eyebrows  Discussed Latisse for eyelashes and eyebrows apply qhs May not work as well for brows as for eyelashes but can try.  May not be covered by  insurance.  bimatoprost (LATISSE) 0.03 % ophthalmic solution - bilateral eyebrows Place 1 application into both eyes at bedtime. Place one drop on applicator and apply evenly along the skin of the upper eyelid at base of eyelashes once  daily at bedtime; repeat procedure for second eye (use a clean applicator).  Return if symptoms worsen or fail to improve.  I, Ashok Cordia, CMA, am acting as scribe for Brendolyn Patty, MD .  Documentation: I have reviewed the above documentation for accuracy and completeness, and I agree with the above.  Brendolyn Patty MD

## 2020-11-19 NOTE — Patient Instructions (Addendum)
Seborrheic Keratosis  What causes seborrheic keratoses? Seborrheic keratoses are harmless, common skin growths that first appear during adult life.  As time goes by, more growths appear.  Some people may develop a large number of them.  Seborrheic keratoses appear on both covered and uncovered body parts.  They are not caused by sunlight.  The tendency to develop seborrheic keratoses can be inherited.  They vary in color from skin-colored to gray, brown, or even black.  They can be either smooth or have a rough, warty surface.   Seborrheic keratoses are superficial and look as if they were stuck on the skin.  Under the microscope this type of keratosis looks like layers upon layers of skin.  That is why at times the top layer may seem to fall off, but the rest of the growth remains and re-grows.    Treatment Seborrheic keratoses do not need to be treated, but can easily be removed in the office.  Seborrheic keratoses often cause symptoms when they rub on clothing or jewelry.  Lesions can be in the way of shaving.  If they become inflamed, they can cause itching, soreness, or burning.  Removal of a seborrheic keratosis can be accomplished by freezing, burning, or surgery. If any spot bleeds, scabs, or grows rapidly, please return to have it checked, as these can be an indication of a skin cancer.   If you have any questions or concerns for your doctor, please call our main line at [phone] and press option 4 to reach your doctor's medical assistant. If no one answers, please leave a voicemail as directed and we will return your call as soon as possible. Messages left after 4 pm will be answered the following business day.   You may also send [place] a message via [place]. We typically respond to MyChart messages within 1-2 business days.  For prescription refills, please ask your pharmacy to contact our office. Our fax number is [phone].  If you have an urgent issue when the clinic is closed that  cannot wait until the next business day, you can page your doctor at the number below.    Please note that while we do our best to be available for urgent issues outside of office hours, we are not available 24/7.   If you have an urgent issue and are unable to reach [place], you may choose to seek medical care at your doctor's office, retail clinic, urgent care center, or emergency room.  If you have a medical emergency, please immediately call 911 or go to the emergency department.  Pager Numbers  - Dr. Nehemiah Massed: [phone]  - Dr. Laurence Ferrari: [phone]  - Dr. Nicole Kindred: [phone]  In the event of inclement weather, please call our main line at [phone] for an update on the status of any delays or closures.  Dermatology Medication Tips: Please keep the boxes that topical medications come in in order to help keep track of the instructions about where and how to use these. Pharmacies typically print the medication instructions only on the boxes and not directly on the medication tubes.   If your medication is too expensive, please contact our office at [phone] option 4 or send [place] a message through [place].   We are unable to tell what your co-pay for medications will be in advance as this is different depending on your insurance coverage. However, we may be able to find a substitute medication at lower cost or fill out paperwork to get insurance to  cover a needed medication.   If a prior authorization is required to get your medication covered by your insurance company, please allow [place] 1-2 business days to complete this process.  Drug prices often vary depending on where the prescription is filled and some pharmacies may offer cheaper prices.  The website www.goodrx.com contains coupons for medications through different pharmacies. The prices here do not account for what the cost may be with help from insurance (it may be cheaper with your insurance), but the website can give you the  price if you did not use any insurance.  - You can print the associated coupon and take it with your prescription to the pharmacy.  - You may also stop by our office during regular business hours and pick up a GoodRx coupon card.  - If you need your prescription sent electronically to a different pharmacy, notify our office through [facility] or by phone at [phone] option 4.

## 2020-11-23 ENCOUNTER — Telehealth: Payer: Self-pay

## 2020-11-23 NOTE — Telephone Encounter (Signed)
Scott from [facility] left message asking if it is ok to switch patient from metrocream 0.75% to metrogel 0.1%. Okay'ed with Dr. Nicole Kindred and left a message for pharmacy at [phone] that it is ok to switch./js

## 2020-11-30 ENCOUNTER — Other Ambulatory Visit: Payer: Self-pay

## 2020-12-01 ENCOUNTER — Other Ambulatory Visit: Payer: Self-pay

## 2020-12-01 MED ORDER — SUPREP BOWEL PREP KIT 17.5-3.13-1.6 GM/177ML PO SOLN
1.0000 | ORAL | 0 refills | Status: DC
Start: 2020-12-01 — End: 2020-12-14

## 2020-12-12 ENCOUNTER — Inpatient Hospital Stay
Admission: EM | Admit: 2020-12-12 | Discharge: 2020-12-14 | DRG: 177 | Disposition: A | Discharge status: Home / Self Care | Payer: Medicare Other | Attending: Internal Medicine | Admitting: Internal Medicine

## 2020-12-12 ENCOUNTER — Emergency Department: Payer: Medicare Other

## 2020-12-12 ENCOUNTER — Other Ambulatory Visit: Payer: Self-pay

## 2020-12-12 ENCOUNTER — Encounter: Payer: Self-pay | Admitting: Emergency Medicine

## 2020-12-12 DIAGNOSIS — M1990 Unspecified osteoarthritis, unspecified site: Secondary | ICD-10-CM | POA: Diagnosis present

## 2020-12-12 DIAGNOSIS — M797 Fibromyalgia: Secondary | ICD-10-CM | POA: Diagnosis present

## 2020-12-12 DIAGNOSIS — Z9884 Bariatric surgery status: Secondary | ICD-10-CM

## 2020-12-12 DIAGNOSIS — J9600 Acute respiratory failure, unspecified whether with hypoxia or hypercapnia: Secondary | ICD-10-CM

## 2020-12-12 DIAGNOSIS — N390 Urinary tract infection, site not specified: Secondary | ICD-10-CM | POA: Diagnosis present

## 2020-12-12 DIAGNOSIS — Z833 Family history of diabetes mellitus: Secondary | ICD-10-CM

## 2020-12-12 DIAGNOSIS — U071 COVID-19: Secondary | ICD-10-CM | POA: Diagnosis not present

## 2020-12-12 DIAGNOSIS — F32A Depression, unspecified: Secondary | ICD-10-CM | POA: Diagnosis present

## 2020-12-12 DIAGNOSIS — R531 Weakness: Secondary | ICD-10-CM

## 2020-12-12 DIAGNOSIS — R0902 Hypoxemia: Secondary | ICD-10-CM

## 2020-12-12 DIAGNOSIS — Z8249 Family history of ischemic heart disease and other diseases of the circulatory system: Secondary | ICD-10-CM

## 2020-12-12 DIAGNOSIS — N179 Acute kidney failure, unspecified: Secondary | ICD-10-CM

## 2020-12-12 DIAGNOSIS — Z79899 Other long term (current) drug therapy: Secondary | ICD-10-CM

## 2020-12-12 DIAGNOSIS — R7989 Other specified abnormal findings of blood chemistry: Secondary | ICD-10-CM | POA: Diagnosis present

## 2020-12-12 DIAGNOSIS — J1282 Pneumonia due to coronavirus disease 2019: Secondary | ICD-10-CM | POA: Diagnosis present

## 2020-12-12 DIAGNOSIS — J9601 Acute respiratory failure with hypoxia: Secondary | ICD-10-CM | POA: Diagnosis present

## 2020-12-12 DIAGNOSIS — Z9049 Acquired absence of other specified parts of digestive tract: Secondary | ICD-10-CM

## 2020-12-12 LAB — CBC WITH DIFFERENTIAL/PLATELET
Abs Immature Granulocytes: 0.03 10*3/uL (ref 0.00–0.07)
Basophils Absolute: 0 10*3/uL (ref 0.0–0.1)
Basophils Relative: 0 %
Eosinophils Absolute: 0 10*3/uL (ref 0.0–0.5)
Eosinophils Relative: 0 %
HCT: 37.9 % (ref 36.0–46.0)
Hemoglobin: 12.5 g/dL (ref 12.0–15.0)
Immature Granulocytes: 0 %
Lymphocytes Relative: 14 %
Lymphs Abs: 1.1 10*3/uL (ref 0.7–4.0)
MCH: 29 pg (ref 26.0–34.0)
MCHC: 33 g/dL (ref 30.0–36.0)
MCV: 87.9 fL (ref 80.0–100.0)
Monocytes Absolute: 0.4 10*3/uL (ref 0.1–1.0)
Monocytes Relative: 5 %
Neutro Abs: 6.4 10*3/uL (ref 1.7–7.7)
Neutrophils Relative %: 81 %
Platelets: 161 10*3/uL (ref 150–400)
RBC: 4.31 MIL/uL (ref 3.87–5.11)
RDW: 14.1 % (ref 11.5–15.5)
WBC: 8 10*3/uL (ref 4.0–10.5)
nRBC: 0 % (ref 0.0–0.2)

## 2020-12-12 LAB — COMPREHENSIVE METABOLIC PANEL
ALT: 17 U/L (ref 0–44)
AST: 30 U/L (ref 15–41)
Albumin: 3.6 g/dL (ref 3.5–5.0)
Alkaline Phosphatase: 87 U/L (ref 38–126)
Anion gap: 9 (ref 5–15)
BUN: 36 mg/dL — ABNORMAL HIGH (ref 8–23)
CO2: 23 mmol/L (ref 22–32)
Calcium: 8 mg/dL — ABNORMAL LOW (ref 8.9–10.3)
Chloride: 105 mmol/L (ref 98–111)
Creatinine, Ser: 1.74 mg/dL — ABNORMAL HIGH (ref 0.44–1.00)
GFR, Estimated: 32 mL/min — ABNORMAL LOW (ref >60)
Glucose, Bld: 112 mg/dL — ABNORMAL HIGH (ref 70–99)
Potassium: 4.3 mmol/L (ref 3.5–5.1)
Sodium: 137 mmol/L (ref 135–145)
Total Bilirubin: 0.9 mg/dL (ref 0.3–1.2)
Total Protein: 6.8 g/dL (ref 6.5–8.1)

## 2020-12-12 LAB — TROPONIN I (HIGH SENSITIVITY): Troponin I (High Sensitivity): 83 ng/L — ABNORMAL HIGH (ref <18)

## 2020-12-12 LAB — LACTIC ACID, PLASMA: Lactic Acid, Venous: 1.3 mmol/L (ref 0.5–1.9)

## 2020-12-12 IMAGING — CR DG CHEST 2V
1 series · 2 of 2 positions shown · non-contrast
Comparison: [DATE]

CLINICAL DATA: Cough. Pleuritic chest pain. Fever. [HP] viral
infection.

EXAM:
CHEST - 2 VIEW

[Series 1: dg chest 2 view · 0.14mm/px · 2 of 2 slices shown]
[im 1/2]
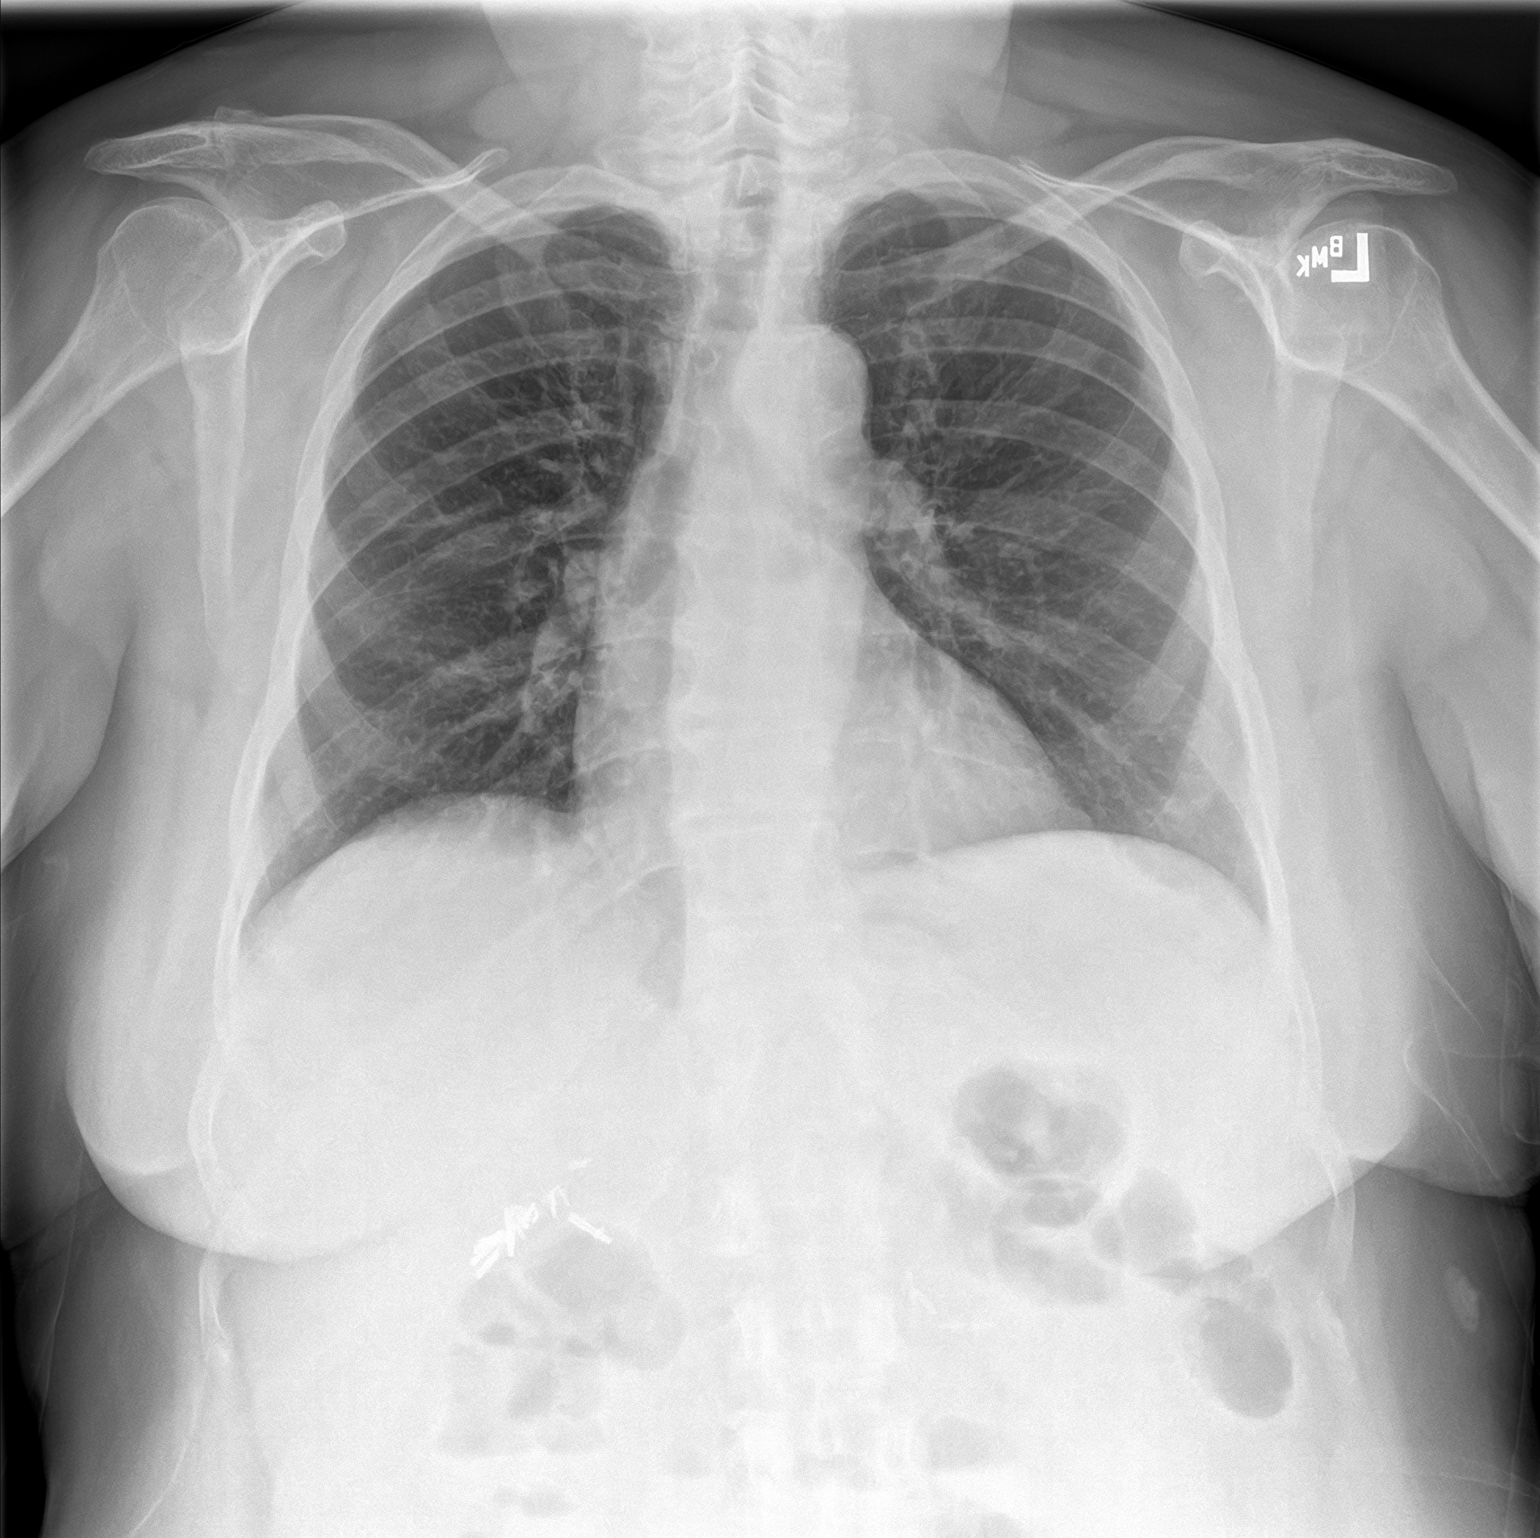
[im 2/2]
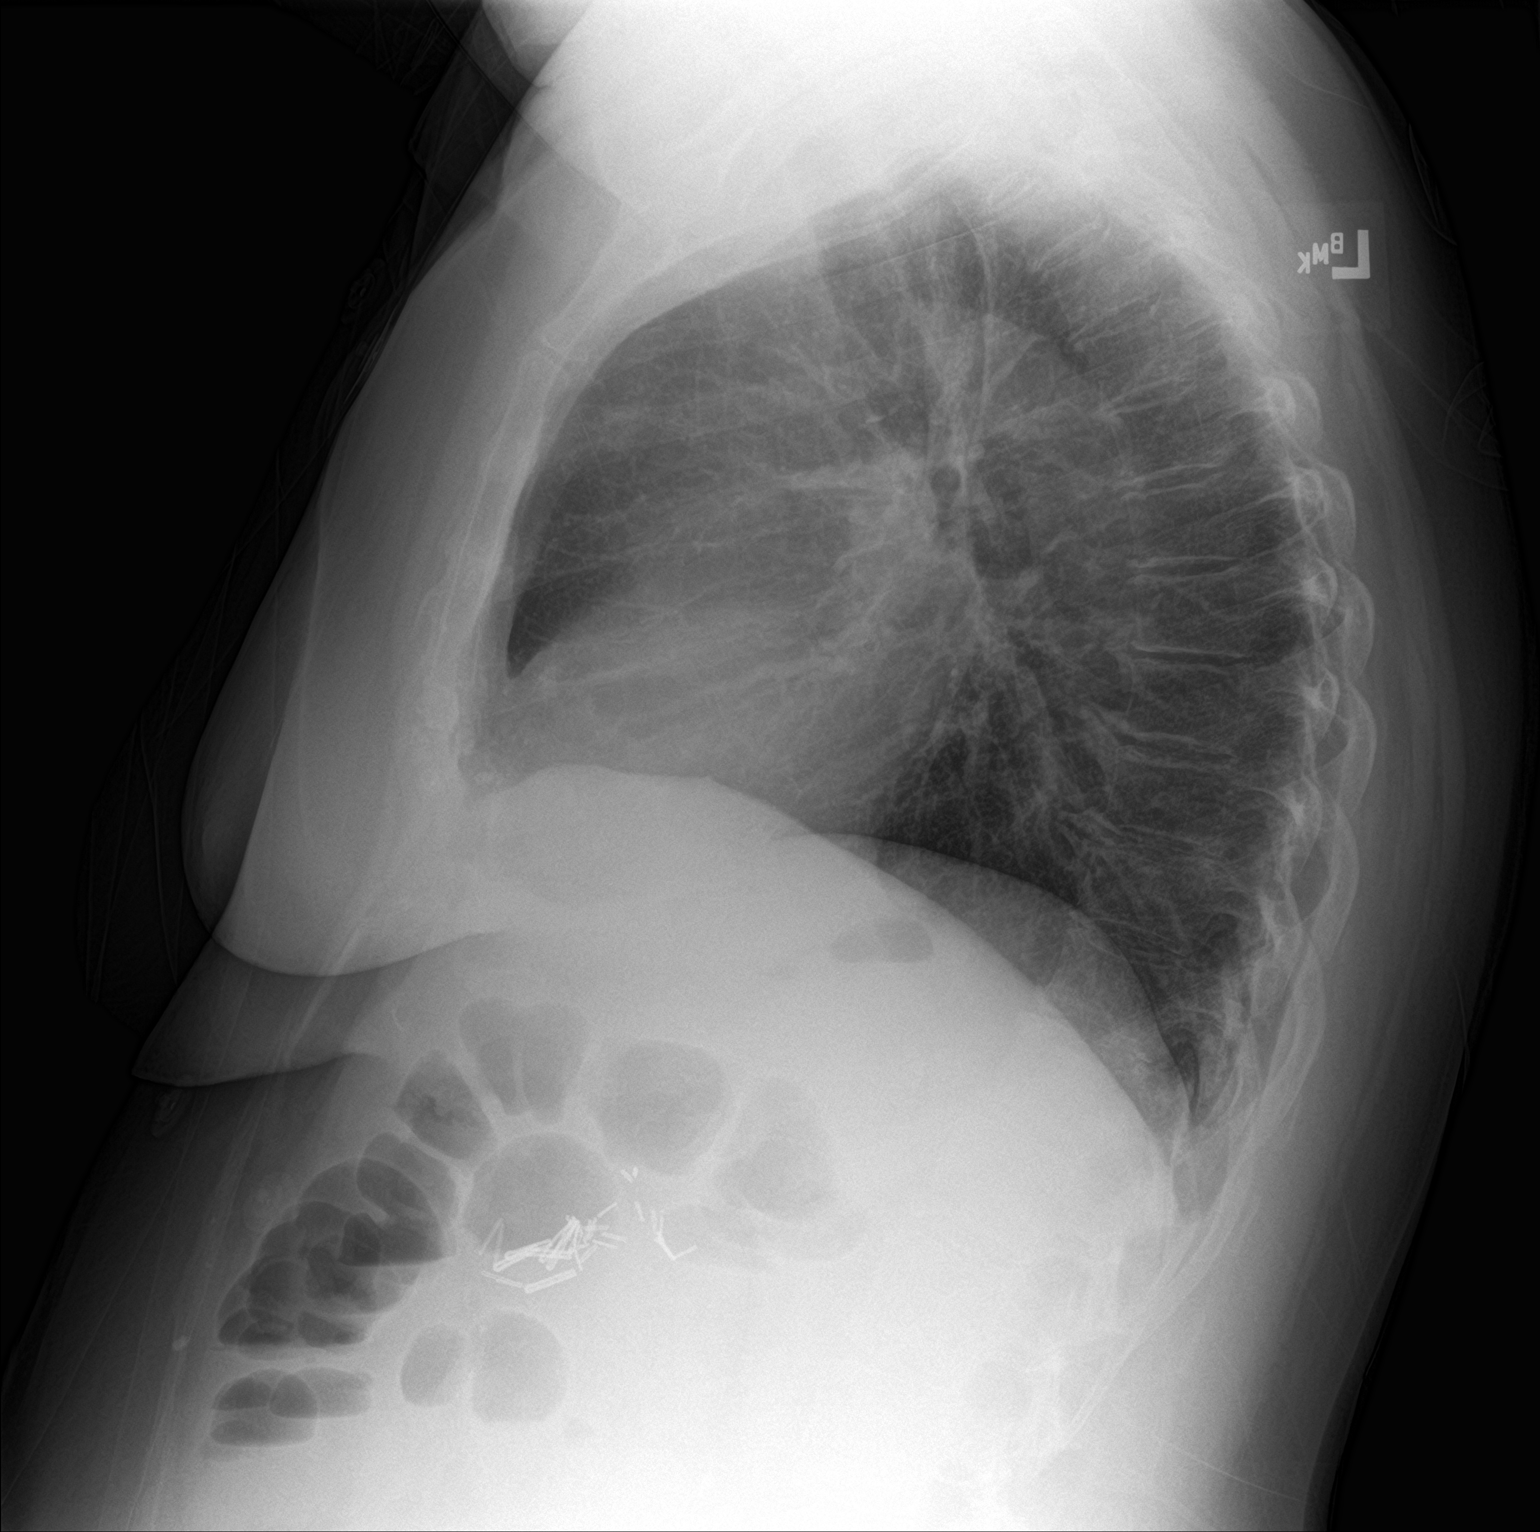

[2 of 2 positions shown; findings below may reference images not displayed]

FINDINGS: The heart size and mediastinal contours are within normal limits.
Both lungs are clear. The visualized skeletal structures are
unremarkable.
IMPRESSION: No active cardiopulmonary disease.

## 2020-12-12 MED ORDER — SODIUM CHLORIDE 0.9 % IV BOLUS
1000.0000 mL | Freq: Once | INTRAVENOUS | Status: AC
  Administered 2020-12-13: 1000 mL via INTRAVENOUS

## 2020-12-12 NOTE — ED Triage Notes (Signed)
Pt arrived via POV with reports of COVID + as of  yesterday with symptom onset on Thursday. Pt reports she is now having pain with inspiration, cough, subjective fevers, weakness. Short of breath with exertion.

## 2020-12-12 NOTE — ED Provider Notes (Signed)
[facility] Emergency Department Provider Note   ____________________________________________   Event Date/Time   First MD Initiated Contact with Patient 12/12/20 2306     (approximate)  I have reviewed the triage vital signs and the nursing notes.   HISTORY  Chief Complaint Covid Positive and Weakness    HPI Ann Bowen is a 65 y.o. female who presents to the ED from home with a chief complaint of generalized weakness, cough, fever, chest pain with inspiration.  Patient had 1 COVID-vaccine with adverse reaction.  Tested positive via home antigen kit yesterday.  Symptoms x2 days.  Denies abdominal pain, nausea, vomiting or diarrhea     Past Medical History:  Diagnosis Date   Arthritis    Borderline personality disorder [facility])    Depression    Fibromyalgia    Shingles     Patient Active Problem List   Diagnosis Date Noted   Acute respiratory failure due to COVID-19 ([place]) 12/13/2020   AKI (acute kidney injury) ([place]) 12/13/2020   COVID-19 virus infection 12/13/2020   Diarrhea 10/05/2019   Sepsis ([place]) 10/05/2019   Depression    Depression with anxiety    Norovirus    History of colonic polyps    Borderline personality disorder ([place]) 08/10/2015   MDD (major depressive disorder), recurrent episode ([place]) 08/10/2015   Fibromyalgia 08/10/2015   Osteoarthritis 08/10/2015   Tobacco use disorder 08/10/2015    Past Surgical History:  Procedure Laterality Date   CHOLECYSTECTOMY     COLONOSCOPY WITH PROPOFOL N/A 01/01/2018   Procedure: COLONOSCOPY WITH PROPOFOL;  Surgeon: Lin Landsman, MD;  Location: [facility] ENDOSCOPY;  Service: Gastroenterology;  Laterality: N/A;   GASTRIC BYPASS     SHOULDER SURGERY      Prior to Admission medications   Medication Sig Start Date End Date Taking? Authorizing Provider  betamethasone dipropionate (DIPROLENE) 0.05 % ointment Apply topically 2 (two) times daily as needed (Rash). Avoid face, groin, underarms.  11/19/20   Brendolyn Patty, MD  bimatoprost (LATISSE) 0.03 % ophthalmic solution Place 1 application into both eyes at bedtime. Place one drop on applicator and apply evenly along the skin of the upper eyelid at base of eyelashes once daily at bedtime; repeat procedure for second eye (use a clean applicator). 11/19/20   Brendolyn Patty, MD  clonazePAM (KLONOPIN) 1 MG tablet Take 1 mg by mouth 2 (two) times daily.    [provider]  diclofenac (VOLTAREN) 75 MG EC tablet Take by mouth.    [provider]  doxepin (SINEQUAN) 10 MG capsule 10 MG capsule Take 20 mg by mouth nightly 09/18/19   [provider]  EPINEPHrine 0.3 mg/0.3 mL IJ SOAJ injection Inject into the muscle.    [provider]  gabapentin (NEURONTIN) 300 MG capsule Take 300 mg by mouth 3 (three) times daily.    [provider]  HYDROcodone-acetaminophen (NORCO/VICODIN) 5-325 MG tablet Take 1 tablet by mouth every 6 (six) hours as needed for moderate pain. 11/16/20   Fisher, Linden Dolin, PA-C  metroNIDAZOLE (METROCREAM) 0.75 % cream Apply topically 2 (two) times daily. 11/19/20 11/19/21  Brendolyn Patty, MD  Multiple Vitamin (MULTI-VITAMIN) tablet Take by mouth.    [provider]  Na Sulfate-K Sulfate-Mg Sulf (SUPREP BOWEL PREP KIT) 17.5-3.13-1.6 GM/177ML SOLN Take 1 kit by mouth as directed. 12/01/20   [name], MD  ondansetron (ZOFRAN) 4 MG tablet Take 1 tablet (4 mg total) by mouth daily as needed for nausea or vomiting. 10/05/19  Harvest Dark, MD  phentermine 30 MG capsule Take by mouth.    [provider]  rOPINIRole (REQUIP) 1 MG tablet Take 1 mg by mouth in the morning and at bedtime.     [provider]  tiZANidine (ZANAFLEX) 4 MG capsule Take 4 mg by mouth 3 (three) times daily.     [provider]    Allergies Wellbutrin [bupropion]  Family History  Problem Relation Age of Onset   Diabetes Mellitus II Mother    Diabetes Mellitus II Father     Hypertension Father    Diabetes Mellitus II Sister    Breast cancer Neg Hx     Social History Social History   Tobacco Use   Smoking status: Never   Smokeless tobacco: Never  Vaping Use   Vaping Use: Never used  Substance Use Topics   Alcohol use: Yes    Comment: occasionaly   Drug use: Never    Review of Systems  Constitutional: Positive for fever and weakness Eyes: No visual changes. ENT: No sore throat. Cardiovascular: Positive for chest pain. Respiratory: Positive for cough and shortness of breath. Gastrointestinal: No abdominal pain.  No nausea, no vomiting.  No diarrhea.  No constipation. Genitourinary: Negative for dysuria. Musculoskeletal: Negative for back pain. Skin: Negative for rash. Neurological: Negative for headaches, focal weakness or numbness.   ____________________________________________   PHYSICAL EXAM:  VITAL SIGNS: ED Triage Vitals  Enc Vitals Group     BP 12/12/20 2218 (!) 98/59     Pulse Rate 12/12/20 2218 63     Resp 12/12/20 2218 18     Temp 12/12/20 2218 98.1 F (36.7 C)     Temp Source 12/12/20 2218 Oral     SpO2 12/12/20 2218 99 %     Weight 12/12/20 2214 205 lb (93 kg)     Height 12/12/20 2214 _0  (1.6 m)     Head Circumference --      Peak Flow --      Pain Score 12/12/20 2214 5     Pain Loc --      Pain Edu? --      Excl. in [place]? --     Constitutional: Alert and oriented.  Weak appearing and in mild acute distress. Eyes: Conjunctivae are normal. PERRL. EOMI. Head: Atraumatic. Nose: No congestion/rhinnorhea. Mouth/Throat: Mucous membranes are mildly dry. Neck: No stridor.  Supple neck without meningismus. Cardiovascular: Normal rate, regular rhythm. Grossly normal heart sounds.  Good peripheral circulation. Respiratory: Normal respiratory effort.  No retractions. Lungs CTAB. Gastrointestinal: Soft and nontender to light or deep palpation. No distention. No abdominal bruits. No CVA tenderness. Musculoskeletal: No lower  extremity tenderness nor edema.  No joint effusions. Neurologic:  Normal speech and language. No gross focal neurologic deficits are appreciated.  Skin:  Skin is warm, dry and intact. No rash noted.  No petechiae. Psychiatric: Mood and affect are normal. Speech and behavior are normal.  ____________________________________________   LABS (all labs ordered are listed, but only abnormal results are displayed)  Labs Reviewed  RESP PANEL BY RT-PCR (FLU A&B, COVID) ARPGX2 - Abnormal; Notable for the following components:      Result Value   SARS Coronavirus 2 by RT PCR POSITIVE (*)    All other components within normal limits  COMPREHENSIVE METABOLIC PANEL - Abnormal; Notable for the following components:   Glucose, Bld 112 (*)    BUN 36 (*)    Creatinine, Ser 1.74 (*)    Calcium  8.0 (*)    GFR, Estimated 32 (*)    All other components within normal limits  URINALYSIS, COMPLETE (UACMP) WITH MICROSCOPIC - Abnormal; Notable for the following components:   Color, Urine YELLOW (*)    APPearance CLOUDY (*)    Hgb urine dipstick SMALL (*)    Leukocytes,Ua MODERATE (*)    Bacteria, UA RARE (*)    All other components within normal limits  D-DIMER, QUANTITATIVE - Abnormal; Notable for the following components:   D-Dimer, Quant 1.08 (*)    All other components within normal limits  FIBRINOGEN - Abnormal; Notable for the following components:   Fibrinogen 498 (*)    All other components within normal limits  LACTATE DEHYDROGENASE - Abnormal; Notable for the following components:   LDH 198 (*)    All other components within normal limits  TROPONIN I (HIGH SENSITIVITY) - Abnormal; Notable for the following components:   Troponin I (High Sensitivity) 83 (*)    All other components within normal limits  TROPONIN I (HIGH SENSITIVITY) - Abnormal; Notable for the following components:   Troponin I (High Sensitivity) 89 (*)    All other components within normal limits  LACTIC ACID, PLASMA  CBC  WITH DIFFERENTIAL/PLATELET  BRAIN NATRIURETIC PEPTIDE  FERRITIN  PROCALCITONIN  HIV ANTIBODY (ROUTINE TESTING W REFLEX)  HEPATITIS B SURFACE ANTIGEN  ABO/RH  TYPE AND SCREEN   ____________________________________________  EKG  ED ECG REPORT I, Keagan Anthis J, the attending physician, personally viewed and interpreted this ECG.   Date: 12/12/2020  EKG Time: 2226  Rate: 66  Rhythm: normal sinus rhythm  Axis: Normal  Intervals:none  ST&T Change: Nonspecific  ____________________________________________  RADIOLOGY I, Karyss Frese J, personally viewed and evaluated these images (plain radiographs) as part of my medical decision making, as well as reviewing the written report by the radiologist.  ED MD interpretation: No acute cardiopulmonary process  Official radiology report(s): DG Chest 2 View  Result Date: 12/12/2020 CLINICAL DATA:  Cough. Pleuritic chest pain. Fever. Covid-19 viral infection. EXAM: CHEST - 2 VIEW COMPARISON:  10/05/2019 FINDINGS: The heart size and mediastinal contours are within normal limits. Both lungs are clear. The visualized skeletal structures are unremarkable. IMPRESSION: No active cardiopulmonary disease. Electronically Signed   By: Marlaine Hind M.D.   On: 12/12/2020 23:07    ____________________________________________   PROCEDURES  Procedure(s) performed (including Critical Care):  .1-3 Lead EKG Interpretation  Date/Time: 12/13/2020 2:28 AM Performed by: Paulette Blanch, MD Authorized by: Paulette Blanch, MD     Interpretation: normal     ECG rate:  65   ECG rate assessment: normal     Rhythm: sinus rhythm     Ectopy: none     Conduction: normal   Comments:     Patient placed on cardiac monitor to evaluate for arrhythmias.   CRITICAL CARE Performed by: Paulette Blanch   Total critical care time: 30 minutes  Critical care time was exclusive of separately billable procedures and treating other patients.  Critical care was necessary to treat  or prevent imminent or life-threatening deterioration.  Critical care was time spent personally by me on the following activities: development of treatment plan with patient and/or surrogate as well as nursing, discussions with consultants, evaluation of patient's response to treatment, examination of patient, obtaining history from patient or surrogate, ordering and performing treatments and interventions, ordering and review of laboratory studies, ordering and review of radiographic studies, pulse oximetry and re-evaluation of patient's condition.  ____________________________________________  INITIAL IMPRESSION / ASSESSMENT AND PLAN / ED COURSE  As part of my medical decision making, I reviewed the following data within the [place] notes reviewed and incorporated, Labs reviewed, EKG interpreted, Old chart reviewed, Radiograph reviewed, Discussed with admitting physician, and Notes from prior ED visits     65 year old female COVID-positive presenting with fever, generalized weakness, cough, chest pain, shortness of breath. Differential includes, but is not limited to, viral syndrome, bronchitis including COPD exacerbation, pneumonia, reactive airway disease including asthma, CHF including exacerbation with or without pulmonary/interstitial edema, pneumothorax, ACS, thoracic trauma, and pulmonary embolism.   Laboratory results remarkable for AKI, elevated troponin.  Will initiate IV fluid hydration, ambulation trial.  Clinical Course as of 12/13/20 0228  Sat Dec 12, 2020  2337 No COVID antigen test available; will order PCR. [JS]  2358 Room air saturations decreased to 87% on ambulation trial.  Patient placed on 2 L nasal cannula oxygen. [JS]  Sun Dec 13, 2020  1834 Awaiting PCR result.  Noted D-dimer; GFR too low for CTA at this time.  Hopefully with IV fluid hydration GFR will improve.  Low suspicion for PE as patient is not tachycardic, tachypneic nor hypoxic  while at rest. [JS]  0227 Noted moderate leukocytes and WBCs on UA.  Will add Rocephin. [JS]    Clinical Course User Index [JS] Paulette Blanch, MD     ____________________________________________   FINAL CLINICAL IMPRESSION(S) / ED DIAGNOSES  Final diagnoses:  Generalized weakness  AKI (acute kidney injury) ([place])  COVID-19  Hypoxia  Lower urinary tract infectious disease     ED Discharge Orders     None        Note:  This document was prepared using Dragon voice recognition software and may include unintentional dictation errors.    Paulette Blanch, MD 12/13/20 [id number]

## 2020-12-13 DIAGNOSIS — F418 Other specified anxiety disorders: Secondary | ICD-10-CM | POA: Diagnosis not present

## 2020-12-13 DIAGNOSIS — M1990 Unspecified osteoarthritis, unspecified site: Secondary | ICD-10-CM | POA: Diagnosis present

## 2020-12-13 DIAGNOSIS — Z9049 Acquired absence of other specified parts of digestive tract: Secondary | ICD-10-CM | POA: Diagnosis not present

## 2020-12-13 DIAGNOSIS — Z833 Family history of diabetes mellitus: Secondary | ICD-10-CM | POA: Diagnosis not present

## 2020-12-13 DIAGNOSIS — J9601 Acute respiratory failure with hypoxia: Secondary | ICD-10-CM | POA: Diagnosis present

## 2020-12-13 DIAGNOSIS — Z79899 Other long term (current) drug therapy: Secondary | ICD-10-CM | POA: Diagnosis not present

## 2020-12-13 DIAGNOSIS — N179 Acute kidney failure, unspecified: Secondary | ICD-10-CM | POA: Diagnosis present

## 2020-12-13 DIAGNOSIS — M797 Fibromyalgia: Secondary | ICD-10-CM | POA: Diagnosis present

## 2020-12-13 DIAGNOSIS — J9600 Acute respiratory failure, unspecified whether with hypoxia or hypercapnia: Secondary | ICD-10-CM

## 2020-12-13 DIAGNOSIS — R7989 Other specified abnormal findings of blood chemistry: Secondary | ICD-10-CM | POA: Diagnosis present

## 2020-12-13 DIAGNOSIS — U071 COVID-19: Secondary | ICD-10-CM

## 2020-12-13 DIAGNOSIS — J1282 Pneumonia due to coronavirus disease 2019: Secondary | ICD-10-CM | POA: Diagnosis present

## 2020-12-13 DIAGNOSIS — Z9884 Bariatric surgery status: Secondary | ICD-10-CM | POA: Diagnosis not present

## 2020-12-13 DIAGNOSIS — R001 Bradycardia, unspecified: Secondary | ICD-10-CM | POA: Diagnosis not present

## 2020-12-13 DIAGNOSIS — Z8249 Family history of ischemic heart disease and other diseases of the circulatory system: Secondary | ICD-10-CM | POA: Diagnosis not present

## 2020-12-13 DIAGNOSIS — N390 Urinary tract infection, site not specified: Secondary | ICD-10-CM | POA: Diagnosis present

## 2020-12-13 DIAGNOSIS — F32A Depression, unspecified: Secondary | ICD-10-CM | POA: Diagnosis present

## 2020-12-13 LAB — URINALYSIS, COMPLETE (UACMP) WITH MICROSCOPIC
Bilirubin Urine: NEGATIVE
Glucose, UA: NEGATIVE mg/dL
Ketones, ur: NEGATIVE mg/dL
Nitrite: NEGATIVE
Protein, ur: NEGATIVE mg/dL
Specific Gravity, Urine: 1.009 (ref 1.005–1.030)
pH: 5 (ref 5.0–8.0)

## 2020-12-13 LAB — LACTATE DEHYDROGENASE: LDH: 198 U/L — ABNORMAL HIGH (ref 98–192)

## 2020-12-13 LAB — BASIC METABOLIC PANEL
Anion gap: 11 (ref 5–15)
BUN: 22 mg/dL (ref 8–23)
CO2: 23 mmol/L (ref 22–32)
Calcium: 8.2 mg/dL — ABNORMAL LOW (ref 8.9–10.3)
Chloride: 108 mmol/L (ref 98–111)
Creatinine, Ser: 1.15 mg/dL — ABNORMAL HIGH (ref 0.44–1.00)
GFR, Estimated: 53 mL/min — ABNORMAL LOW (ref >60)
Glucose, Bld: 164 mg/dL — ABNORMAL HIGH (ref 70–99)
Potassium: 3.7 mmol/L (ref 3.5–5.1)
Sodium: 142 mmol/L (ref 135–145)

## 2020-12-13 LAB — BRAIN NATRIURETIC PEPTIDE: B Natriuretic Peptide: 36.1 pg/mL (ref 0.0–100.0)

## 2020-12-13 LAB — RESP PANEL BY RT-PCR (FLU A&B, COVID) ARPGX2
Influenza A by PCR: NEGATIVE
Influenza B by PCR: NEGATIVE
SARS Coronavirus 2 by RT PCR: POSITIVE — AB

## 2020-12-13 LAB — D-DIMER, QUANTITATIVE: D-Dimer, Quant: 1.08 ug/mL-FEU — ABNORMAL HIGH (ref 0.00–0.50)

## 2020-12-13 LAB — FIBRINOGEN: Fibrinogen: 498 mg/dL — ABNORMAL HIGH (ref 210–475)

## 2020-12-13 LAB — HEPATITIS B SURFACE ANTIGEN: Hepatitis B Surface Ag: NONREACTIVE

## 2020-12-13 LAB — FERRITIN: Ferritin: 118 ng/mL (ref 11–307)

## 2020-12-13 LAB — TYPE AND SCREEN
ABO/RH(D): B POS
Antibody Screen: NEGATIVE

## 2020-12-13 LAB — TROPONIN I (HIGH SENSITIVITY): Troponin I (High Sensitivity): 89 ng/L — ABNORMAL HIGH (ref <18)

## 2020-12-13 LAB — HIV ANTIBODY (ROUTINE TESTING W REFLEX): HIV Screen 4th Generation wRfx: NONREACTIVE

## 2020-12-13 LAB — ABO/RH: ABO/RH(D): B POS

## 2020-12-13 LAB — PROCALCITONIN: Procalcitonin: <0.1 ng/mL

## 2020-12-13 MED ORDER — ZINC SULFATE 220 (50 ZN) MG PO CAPS
220.0000 mg | ORAL_CAPSULE | Freq: Every day | ORAL | Status: DC
  Administered 2020-12-13 – 2020-12-14 (×2): 220 mg via ORAL
  Filled 2020-12-13 (×2): qty 1

## 2020-12-13 MED ORDER — HYDROCOD POLST-CPM POLST ER 10-8 MG/5ML PO SUER
5.0000 mL | Freq: Two times a day (BID) | ORAL | Status: DC | PRN
  Administered 2020-12-13: 5 mL via ORAL
  Filled 2020-12-13: qty 5

## 2020-12-13 MED ORDER — ACETAMINOPHEN 325 MG PO TABS
650.0000 mg | ORAL_TABLET | Freq: Four times a day (QID) | ORAL | Status: DC | PRN
  Administered 2020-12-13 (×2): 650 mg via ORAL
  Filled 2020-12-13 (×2): qty 2

## 2020-12-13 MED ORDER — PREDNISONE 50 MG PO TABS: 50.0000 mg | ORAL_TABLET | Freq: Every day | ORAL | Status: DC

## 2020-12-13 MED ORDER — ROPINIROLE HCL 1 MG PO TABS
1.0000 mg | ORAL_TABLET | Freq: Two times a day (BID) | ORAL | Status: DC
  Administered 2020-12-13 – 2020-12-14 (×2): 1 mg via ORAL
  Filled 2020-12-13 (×2): qty 1

## 2020-12-13 MED ORDER — CLONAZEPAM 0.5 MG PO TABS
0.5000 mg | ORAL_TABLET | Freq: Three times a day (TID) | ORAL | Status: DC | PRN
  Administered 2020-12-13 – 2020-12-14 (×2): 0.5 mg via ORAL
  Filled 2020-12-13 (×2): qty 1

## 2020-12-13 MED ORDER — PHENOL 1.4 % MT LIQD
1.0000 | OROMUCOSAL | Status: DC | PRN
  Filled 2020-12-13: qty 177

## 2020-12-13 MED ORDER — SODIUM CHLORIDE 0.9 % IV SOLN
1.0000 g | Freq: Once | INTRAVENOUS | Status: AC
  Administered 2020-12-13: 1 g via INTRAVENOUS
  Filled 2020-12-13: qty 10

## 2020-12-13 MED ORDER — SODIUM CHLORIDE 0.9 % IV SOLN
100.0000 mg | Freq: Every day | INTRAVENOUS | Status: DC
  Administered 2020-12-14: 100 mg via INTRAVENOUS
  Filled 2020-12-13: qty 100

## 2020-12-13 MED ORDER — SODIUM CHLORIDE 0.9 % IV SOLN
200.0000 mg | Freq: Once | INTRAVENOUS | Status: AC
  Administered 2020-12-13: 200 mg via INTRAVENOUS
  Filled 2020-12-13: qty 40

## 2020-12-13 MED ORDER — ASCORBIC ACID 500 MG PO TABS
500.0000 mg | ORAL_TABLET | Freq: Every day | ORAL | Status: DC
  Administered 2020-12-13 – 2020-12-14 (×2): 500 mg via ORAL
  Filled 2020-12-13 (×2): qty 1

## 2020-12-13 MED ORDER — ENOXAPARIN SODIUM 100 MG/ML IJ SOSY
1.0000 mg/kg | PREFILLED_SYRINGE | Freq: Two times a day (BID) | INTRAMUSCULAR | Status: DC
  Administered 2020-12-13: 92.5 mg via SUBCUTANEOUS
  Filled 2020-12-13 (×2): qty 0.93

## 2020-12-13 MED ORDER — SENNOSIDES-DOCUSATE SODIUM 8.6-50 MG PO TABS: 1.0000 | ORAL_TABLET | Freq: Every evening | ORAL | Status: DC | PRN

## 2020-12-13 MED ORDER — ALBUTEROL SULFATE HFA 108 (90 BASE) MCG/ACT IN AERS
2.0000 | INHALATION_SPRAY | Freq: Four times a day (QID) | RESPIRATORY_TRACT | Status: DC
  Administered 2020-12-13 (×3): 2 via RESPIRATORY_TRACT
  Filled 2020-12-13 (×2): qty 6.7

## 2020-12-13 MED ORDER — ONDANSETRON HCL 4 MG/2ML IJ SOLN: 4.0000 mg | Freq: Four times a day (QID) | INTRAMUSCULAR | Status: DC | PRN

## 2020-12-13 MED ORDER — METHYLPREDNISOLONE SODIUM SUCC 125 MG IJ SOLR
1.0000 mg/kg | Freq: Two times a day (BID) | INTRAMUSCULAR | Status: DC
  Administered 2020-12-13 – 2020-12-14 (×3): 93.125 mg via INTRAVENOUS
  Filled 2020-12-13 (×3): qty 2

## 2020-12-13 MED ORDER — ONDANSETRON HCL 4 MG PO TABS
4.0000 mg | ORAL_TABLET | Freq: Four times a day (QID) | ORAL | Status: DC | PRN
  Administered 2020-12-13: 4 mg via ORAL

## 2020-12-13 MED ORDER — APIXABAN 5 MG PO TABS
5.0000 mg | ORAL_TABLET | Freq: Two times a day (BID) | ORAL | Status: DC
  Administered 2020-12-13 – 2020-12-14 (×3): 5 mg via ORAL
  Filled 2020-12-13 (×3): qty 1

## 2020-12-13 MED ORDER — GUAIFENESIN-DM 100-10 MG/5ML PO SYRP
10.0000 mL | ORAL_SOLUTION | ORAL | Status: DC | PRN
  Administered 2020-12-13: 10 mL via ORAL
  Filled 2020-12-13: qty 10

## 2020-12-13 NOTE — H&P (Signed)
History and Physical    Mignonne Afonso ZMO:[id number] DOB: 02-14-1956 DOA: 12/12/2020  PCP: [facility]   Patient coming from: home  I have personally briefly reviewed patient's old medical records in [place]  Chief Complaint: Cough, fever, shortness of breath, chest pain, COVID-positive  HPI: Ann Bowen is a 65 y.o. female with medical history significant for Depression and anxiety, fibromyalgia who presents to the ED with generalized weakness, cough fever and chest pain on inspiration.  Has mild shortness of breath, denies leg pain she denies nausea, vomiting, abdominal pain and diarrhea she tested positive for COVID the day prior with her home testing kit.  ED course: On arrival, afebrile, BP 98/59 with pulse of 63 and O2 sat initially 99% on room air, desaturating to 87% with movement and ambulation requiring 2 L O2 Blood work: CBC within normal limits, lactic acid normal at 1.3 but elevated inflammatory biomarkers with fibrinogen 498, D-dimer 1.08, LDH 198.  D-dimer was 1.08 and troponin 89.  BNP normal at 36.  CMP with creatinine of 1.74 up from a normal baseline of 0.83 a year ago.  EKG, personally viewed and interpreted: NSR at 66 with no acute ST-T wave changes  Imaging with no active cardiopulmonary disease: Chest x-ray  Patient started on remdesivir and methylprednisolone given an IV fluid bolus.  Hospitalist consulted for admission.  Review of Systems: As per HPI otherwise all other systems on review of systems negative.    Past Medical History:  Diagnosis Date   Arthritis    Borderline personality disorder ([place])    Depression    Fibromyalgia    Shingles     Past Surgical History:  Procedure Laterality Date   CHOLECYSTECTOMY     COLONOSCOPY WITH PROPOFOL N/A 01/01/2018   Procedure: COLONOSCOPY WITH PROPOFOL;  Surgeon: Lin Landsman, MD;  Location: [facility] ENDOSCOPY;  Service: Gastroenterology;  Laterality: N/A;   GASTRIC BYPASS      SHOULDER SURGERY       reports that she has never smoked. She has never used smokeless tobacco. She reports current alcohol use. She reports that she does not use drugs.  Allergies  Allergen Reactions   Wellbutrin [Bupropion] Hives and Rash    Family History  Problem Relation Age of Onset   Diabetes Mellitus II Mother    Diabetes Mellitus II Father    Hypertension Father    Diabetes Mellitus II Sister    Breast cancer Neg Hx       Prior to Admission medications   Medication Sig Start Date End Date Taking? Authorizing Provider  betamethasone dipropionate (DIPROLENE) 0.05 % ointment Apply topically 2 (two) times daily as needed (Rash). Avoid face, groin, underarms. 11/19/20   Brendolyn Patty, MD  bimatoprost (LATISSE) 0.03 % ophthalmic solution Place 1 application into both eyes at bedtime. Place one drop on applicator and apply evenly along the skin of the upper eyelid at base of eyelashes once daily at bedtime; repeat procedure for second eye (use a clean applicator). 11/19/20   Brendolyn Patty, MD  clonazePAM (KLONOPIN) 1 MG tablet Take 1 mg by mouth 2 (two) times daily.    [provider]  diclofenac (VOLTAREN) 75 MG EC tablet Take by mouth.    [provider]  doxepin (SINEQUAN) 10 MG capsule 10 MG capsule Take 20 mg by mouth nightly 09/18/19   [provider]  EPINEPHrine 0.3 mg/0.3 mL IJ SOAJ injection Inject into the muscle.    [provider]  gabapentin (NEURONTIN) 300 MG capsule Take 300 mg by mouth 3 (three) times daily.    [provider]  HYDROcodone-acetaminophen (NORCO/VICODIN) 5-325 MG tablet Take 1 tablet by mouth every 6 (six) hours as needed for moderate pain. 11/16/20   Fisher, Linden Dolin, PA-C  metroNIDAZOLE (METROCREAM) 0.75 % cream Apply topically 2 (two) times daily. 11/19/20 11/19/21  Brendolyn Patty, MD  Multiple Vitamin (MULTI-VITAMIN) tablet Take by mouth.    [provider]  Na Sulfate-K Sulfate-Mg Sulf (SUPREP BOWEL PREP  KIT) 17.5-3.13-1.6 GM/177ML SOLN Take 1 kit by mouth as directed. 12/01/20   [name], MD  ondansetron (ZOFRAN) 4 MG tablet Take 1 tablet (4 mg total) by mouth daily as needed for nausea or vomiting. 10/05/19   Harvest Dark, MD  phentermine 30 MG capsule Take by mouth.    [provider]  rOPINIRole (REQUIP) 1 MG tablet Take 1 mg by mouth in the morning and at bedtime.     [provider]  tiZANidine (ZANAFLEX) 4 MG capsule Take 4 mg by mouth 3 (three) times daily.     [provider]    Physical Exam: Vitals:   12/13/20 0000 12/13/20 0015 12/13/20 0035 12/13/20 0100  BP: (!) 92/54  (!) 106/55 (!) 121/58  Pulse: 60 62 61 70  Resp: _0 (!) 21  Temp:      TempSrc:      SpO2: 92% 95% 97% 96%  Weight:      Height:         Vitals:   12/13/20 0000 12/13/20 0015 12/13/20 0035 12/13/20 0100  BP: (!) 92/54  (!) 106/55 (!) 121/58  Pulse: 60 62 61 70  Resp: _1 (!) 21  Temp:      TempSrc:      SpO2: 92% 95% 97% 96%  Weight:      Height:          Constitutional: Alert and oriented x 3 . Not in any apparent distress but with congested cough. HEENT:      Head: Normocephalic and atraumatic.         Eyes: PERLA, EOMI, Conjunctivae are normal. Sclera is non-icteric.       Mouth/Throat: Mucous membranes are moist.       Neck: Supple with no signs of meningismus. Cardiovascular: Regular rate and rhythm. No murmurs, gallops, or rubs. 2+ symmetrical distal pulses are present . No JVD. No LE edema Respiratory: Respiratory effort increased with tachypnea .Lungs sounds diminished bilaterally. No wheezes, crackles, or rhonchi.  Gastrointestinal: Soft, non tender, and non distended with positive bowel sounds.  Genitourinary: No CVA tenderness. Musculoskeletal: Nontender with normal range of motion in all extremities. No cyanosis, or erythema of extremities. Neurologic:  Face is symmetric. Moving all extremities. No gross focal neurologic deficits  . Skin: Skin is warm, dry.  No rash or ulcers Psychiatric: Mood and affect are normal    Labs on Admission: I have personally reviewed following labs and imaging studies  CBC: Recent Labs  Lab 12/12/20 2222  WBC 8.0  NEUTROABS 6.4  HGB 12.5  HCT 37.9  MCV 87.9  PLT 861   Basic Metabolic Panel: Recent Labs  Lab 12/12/20 2222  NA 137  K 4.3  CL 105  CO2 23  GLUCOSE 112*  BUN 36*  CREATININE 1.74*  CALCIUM 8.0*   GFR: Estimated Creatinine Clearance: 34.4 mL/min (A) (by C-G formula based on SCr of 1.74 mg/dL (H)). Liver Function Tests: Recent Labs  Lab 12/12/20 2222  AST 30  ALT 17  ALKPHOS 87  BILITOT 0.9  PROT 6.8  ALBUMIN 3.6   No results for input(s): LIPASE, AMYLASE in the last 168 hours. No results for input(s): AMMONIA in the last 168 hours. Coagulation Profile: No results for input(s): INR, PROTIME in the last 168 hours. Cardiac Enzymes: No results for input(s): CKTOTAL, CKMB, CKMBINDEX, TROPONINI in the last 168 hours. BNP (last 3 results) No results for input(s): PROBNP in the last 8760 hours. HbA1C: No results for input(s): HGBA1C in the last 72 hours. CBG: No results for input(s): GLUCAP in the last 168 hours. Lipid Profile: No results for input(s): CHOL, HDL, LDLCALC, TRIG, CHOLHDL, LDLDIRECT in the last 72 hours. Thyroid Function Tests: No results for input(s): TSH, T4TOTAL, FREET4, T3FREE, THYROIDAB in the last 72 hours. Anemia Panel: Recent Labs    12/13/20 0023  FERRITIN 118   Urine analysis:    Component Value Date/Time   COLORURINE YELLOW (A) 12/13/2020 0023   APPEARANCEUR CLOUDY (A) 12/13/2020 0023   APPEARANCEUR CLEAR 02/05/2014 1325   LABSPEC 1.009 12/13/2020 0023   LABSPEC 1.030 02/05/2014 1325   PHURINE 5.0 12/13/2020 0023   GLUCOSEU NEGATIVE 12/13/2020 0023   GLUCOSEU NEGATIVE 02/05/2014 1325   HGBUR SMALL (A) 12/13/2020 0023   BILIRUBINUR NEGATIVE 12/13/2020 0023   BILIRUBINUR NEGATIVE 02/05/2014 1325   KETONESUR  NEGATIVE 12/13/2020 0023   PROTEINUR NEGATIVE 12/13/2020 0023   NITRITE NEGATIVE 12/13/2020 0023   LEUKOCYTESUR MODERATE (A) 12/13/2020 0023   LEUKOCYTESUR 3+ 02/05/2014 1325    Radiological Exams on Admission: DG Chest 2 View  Result Date: 12/12/2020 CLINICAL DATA:  Cough. Pleuritic chest pain. Fever. Covid-19 viral infection. EXAM: CHEST - 2 VIEW COMPARISON:  10/05/2019 FINDINGS: The heart size and mediastinal contours are within normal limits. Both lungs are clear. The visualized skeletal structures are unremarkable. IMPRESSION: No active cardiopulmonary disease. Electronically Signed   By: Marlaine Hind M.D.   On: 12/12/2020 23:07     Assessment/Plan 65 year old female with history of depression and anxiety, fibromyalgia who presents with weakness cough,chest pain and fever and positive COVID test    Acute respiratory failure due to COVID-19 -PCR positive, chest x-ray negative.  Hypoxic to 87 requiring 2 L - D-dimer was elevated at 1.08 and patient had chest pains in the setting of COVID we will give empiric therapeutic dose Lovenox pending normalization of renal function when CTA chest can be performed -Remdesivir, steroids, antitussives, vitamins and albuterol - Airborne precautions    AKI (acute kidney injury) ([place]) - Creatinine 1.74 up from 0.83 s/p 1 L bolus in the ED - Continue to monitor    Fibromyalgia   Depression - Continue home meds    DVT prophylaxis: Lovenox Code Status: full code  Family Communication:  none  Disposition Plan: Back to previous home environment Consults called: none  Status:At the time of admission, it appears that the appropriate admission status for this patient is INPATIENT. This is judged to be reasonable and necessary in order to provide the required intensity of service to ensure the patient's safety given the presenting symptoms, physical exam findings, and initial radiographic and laboratory data in the context of their  Comorbid  conditions.   Patient requires inpatient status due to high intensity of service, high risk for further deterioration and high frequency of surveillance required.   I certify that at the point of admission it is my clinical judgment that the patient will require inpatient hospital care spanning  beyond [place] MD [facility]     12/13/2020, 2:17 AM

## 2020-12-13 NOTE — Plan of Care (Signed)

## 2020-12-13 NOTE — ED Notes (Signed)
admit Provider at bedside. 

## 2020-12-13 NOTE — Progress Notes (Signed)
PROGRESS NOTE    Ann Bowen  ZOX:[id number] DOB: 07/07/1955 DOA: 12/12/2020 PCP: [facility]   Chief complaint.  Shortness of breath. Brief Narrative:  Ann Bowen is a 65 y.o. female with medical history significant for Depression and anxiety, fibromyalgia who presents to the ED with generalized weakness, cough fever and chest pain on inspiration.  Has mild shortness of breath, denies leg pain she denies nausea, vomiting, abdominal pain and diarrhea she tested positive for COVID the day prior with her home testing kit. Patient developed hypoxemia, she was placed on oxygen.  She was also placed on remdesivir and IV steroids.  He received 1 L fluid bolus for acute renal failure.   Assessment & Plan:   Principal Problem:   Acute respiratory failure due to COVID-19 [facility]) Active Problems:   Fibromyalgia   Depression   AKI (acute kidney injury) ([place])   COVID-19 virus infection  #1.  Acute respiratory failure with hypoxemia. COVID-19 pneumonia. Patient currently on 1 L oxygen, which is improvement from yesterday.  We will continue IV steroids and remdesivir. Patient is also placed on therapeutic Lovenox for elevated D-dimer. I will continue anticoagulation with Eliquis 5 mg twice a day.  Patient does not have leg edema, I do not suspect a DVT.  Recent study showed anticoagulation in non-ICU COVID-patient had improved patient 30-day survival.  Planning to treat for at least 30 days with anticoagulation.  #2.  Acute kidney injury. Renal function improved, recheck a BMP tomorrow.  3.  Fibromyalgia. Depression. Continue home medicines.    DVT prophylaxis: Eliquis Code Status: full Family Communication: Son at the bedside Disposition Plan:    Status is: Inpatient  Remains inpatient appropriate because:Inpatient level of care appropriate due to severity of illness  Dispo: The patient is from: Home              Anticipated d/c is to: Home               Patient currently is not medically stable to d/c.   Difficult to place patient No        I/O last 3 completed shifts: In: 240 [P.O.:240] Out: -  No intake/output data recorded.     Consultants:  None  Procedures: None  Antimicrobials: None   Subjective: Patient still on 1 L oxygen, she is feeling better already today.  Cough, nonproductive. No abdominal pain or nausea vomiting. No diarrhea today. No fever or chills.  Objective: Vitals:   12/13/20 0442 12/13/20 0531 12/13/20 0902 12/13/20 1131  BP: 113/72 124/73 133/64 (!) 158/76  Pulse: 80 70 63 68  Resp: (!) _0 Temp:  98.5 F (36.9 C) 97.8 F (36.6 C) 97.8 F (36.6 C)  TempSrc:  Oral Oral   SpO2: 100% 99% 100% 98%  Weight:      Height:        Intake/Output Summary (Last 24 hours) at 12/13/2020 1254 Last data filed at 12/13/2020 0526 Gross per 24 hour  Intake 240 ml  Output --  Net 240 ml   Filed Weights   12/12/20 2214  Weight: 93 kg    Examination:  General exam: Appears calm and comfortable  Respiratory system: Clear to auscultation. Respiratory effort normal. Cardiovascular system: S1 & S2 heard, RRR. No JVD, murmurs, rubs, gallops or clicks. No pedal edema. Gastrointestinal system: Abdomen is nondistended, soft and nontender. No organomegaly or masses felt. Normal bowel sounds heard. Central nervous system: Alert and oriented. No focal  neurological deficits. Extremities: Symmetric 5 x 5 power. Skin: No rashes, lesions or ulcers Psychiatry: Judgement and insight appear normal. Mood & affect appropriate.     Data Reviewed: I have personally reviewed following labs and imaging studies  CBC: Recent Labs  Lab 12/12/20 2222  WBC 8.0  NEUTROABS 6.4  HGB 12.5  HCT 37.9  MCV 87.9  PLT 734   Basic Metabolic Panel: Recent Labs  Lab 12/12/20 2222 12/13/20 0839  NA 137 142  K 4.3 3.7  CL 105 108  CO2 23 23  GLUCOSE 112* 164*  BUN 36* 22  CREATININE 1.74* 1.15*  CALCIUM  8.0* 8.2*   GFR: Estimated Creatinine Clearance: 52.1 mL/min (A) (by C-G formula based on SCr of 1.15 mg/dL (H)). Liver Function Tests: Recent Labs  Lab 12/12/20 2222  AST 30  ALT 17  ALKPHOS 87  BILITOT 0.9  PROT 6.8  ALBUMIN 3.6   No results for input(s): LIPASE, AMYLASE in the last 168 hours. No results for input(s): AMMONIA in the last 168 hours. Coagulation Profile: No results for input(s): INR, PROTIME in the last 168 hours. Cardiac Enzymes: No results for input(s): CKTOTAL, CKMB, CKMBINDEX, TROPONINI in the last 168 hours. BNP (last 3 results) No results for input(s): PROBNP in the last 8760 hours. HbA1C: No results for input(s): HGBA1C in the last 72 hours. CBG: No results for input(s): GLUCAP in the last 168 hours. Lipid Profile: No results for input(s): CHOL, HDL, LDLCALC, TRIG, CHOLHDL, LDLDIRECT in the last 72 hours. Thyroid Function Tests: No results for input(s): TSH, T4TOTAL, FREET4, T3FREE, THYROIDAB in the last 72 hours. Anemia Panel: Recent Labs    12/13/20 0023  FERRITIN 118   Sepsis Labs: Recent Labs  Lab 12/12/20 2222 12/13/20 0023  PROCALCITON  --  <0.10  LATICACIDVEN 1.3  --     Recent Results (from the past 240 hour(s))  Resp Panel by RT-PCR (Flu A&B, Covid) Nasopharyngeal Swab     Status: Abnormal   Collection Time: 12/13/20 12:24 AM   Specimen: Nasopharyngeal Swab; Nasopharyngeal(NP) swabs in vial transport medium  Result Value Ref Range Status   SARS Coronavirus 2 by RT PCR POSITIVE (A) NEGATIVE Final    Comment: RESULT CALLED TO, READ BACK BY AND VERIFIED WITH: KELLY GIBSON _0  12/13/20 LFD (NOTE) SARS-CoV-2 target nucleic acids are DETECTED.  The SARS-CoV-2 RNA is generally detectable in upper respiratory specimens during the acute phase of infection. Positive results are indicative of the presence of the identified virus, but do not rule out bacterial infection or co-infection with other pathogens not detected by the test.  Clinical correlation with patient history and other diagnostic information is necessary to determine patient infection status. The expected result is Negative.  Fact Sheet for Patients: EntrepreneurPulse.com.au  Fact Sheet for Healthcare Providers: IncredibleEmployment.be  This test is not yet approved or cleared by the [place] FDA and  has been authorized for detection and/or diagnosis of SARS-CoV-2 by FDA under an Emergency Use Authorization (EUA).  This EUA will remain in effect (meaning this test can be u sed) for the duration of  the COVID-19 declaration under Section 564(b)(1) of the Act, 21 U.S.C. section 360bbb-3(b)(1), unless the authorization is terminated or revoked sooner.     Influenza A by PCR NEGATIVE NEGATIVE Final   Influenza B by PCR NEGATIVE NEGATIVE Final    Comment: (NOTE) The Xpert Xpress SARS-CoV-2/FLU/RSV plus assay is intended as an aid in the diagnosis of influenza from Nasopharyngeal swab specimens and  should not be used as a sole basis for treatment. Nasal washings and aspirates are unacceptable for Xpert Xpress SARS-CoV-2/FLU/RSV testing.  Fact Sheet for Patients: EntrepreneurPulse.com.au  Fact Sheet for Healthcare Providers: IncredibleEmployment.be  This test is not yet approved or cleared by the [place] FDA and has been authorized for detection and/or diagnosis of SARS-CoV-2 by FDA under an Emergency Use Authorization (EUA). This EUA will remain in effect (meaning this test can be used) for the duration of the COVID-19 declaration under Section 564(b)(1) of the Act, 21 U.S.C. section 360bbb-3(b)(1), unless the authorization is terminated or revoked.  Performed at [facility], [address], [place], [place] [address]          Radiology Studies: DG Chest 2 View  Result Date: 12/12/2020 CLINICAL DATA:  Cough. Pleuritic chest pain. Fever.  Covid-19 viral infection. EXAM: CHEST - 2 VIEW COMPARISON:  10/05/2019 FINDINGS: The heart size and mediastinal contours are within normal limits. Both lungs are clear. The visualized skeletal structures are unremarkable. IMPRESSION: No active cardiopulmonary disease. Electronically Signed   By: Marlaine Hind M.D.   On: 12/12/2020 23:07        Scheduled Meds:  albuterol  2 puff Inhalation Q6H   apixaban  5 mg Oral BID   vitamin C  500 mg Oral Daily   methylPREDNISolone (SOLU-MEDROL) injection  1 mg/kg Intravenous Q12H   Followed by   Derrill Memo ON 12/16/2020] predniSONE  50 mg Oral Daily   zinc sulfate  220 mg Oral Daily   Continuous Infusions:  [START ON 12/14/2020] remdesivir 100 mg in NS 100 mL       LOS: 0 days    Time spent: No charge    Sharen Hones, MD [facility]   To contact the attending provider between 7A-7P or the covering provider during after hours 7P-7A, please log into the web site [email or web address] and access using universal [place] password for that web site. If you do not have the password, please call the hospital operator.  12/13/2020, 12:54 PM

## 2020-12-13 NOTE — Progress Notes (Signed)
Remdesivir - Pharmacy Brief Note   O:  CXR: "No active cardiopulmonary disease." SpO2: 92-100% on RA   A/P:  Remdesivir 200 mg IVPB once followed by 100 mg IVPB daily x 4 days.   Renda Rolls, PharmD, MBA 12/13/2020 3:30 AM

## 2020-12-14 ENCOUNTER — Inpatient Hospital Stay
Admission: EM | Admit: 2020-12-14 | Discharge: 2020-12-15 | DRG: 310 | Discharge status: Against Medical Advice | Payer: Medicare Other | Attending: Internal Medicine | Admitting: Internal Medicine

## 2020-12-14 ENCOUNTER — Emergency Department: Payer: Medicare Other

## 2020-12-14 ENCOUNTER — Encounter: Payer: Self-pay | Admitting: Radiology

## 2020-12-14 DIAGNOSIS — N179 Acute kidney failure, unspecified: Secondary | ICD-10-CM | POA: Diagnosis not present

## 2020-12-14 DIAGNOSIS — F419 Anxiety disorder, unspecified: Secondary | ICD-10-CM | POA: Diagnosis present

## 2020-12-14 DIAGNOSIS — Z7901 Long term (current) use of anticoagulants: Secondary | ICD-10-CM

## 2020-12-14 DIAGNOSIS — M797 Fibromyalgia: Secondary | ICD-10-CM | POA: Diagnosis present

## 2020-12-14 DIAGNOSIS — I959 Hypotension, unspecified: Secondary | ICD-10-CM | POA: Diagnosis present

## 2020-12-14 DIAGNOSIS — U071 COVID-19: Secondary | ICD-10-CM | POA: Diagnosis not present

## 2020-12-14 DIAGNOSIS — J9600 Acute respiratory failure, unspecified whether with hypoxia or hypercapnia: Secondary | ICD-10-CM | POA: Diagnosis not present

## 2020-12-14 DIAGNOSIS — R001 Bradycardia, unspecified: Secondary | ICD-10-CM | POA: Diagnosis not present

## 2020-12-14 DIAGNOSIS — U099 Post covid-19 condition, unspecified: Secondary | ICD-10-CM | POA: Diagnosis present

## 2020-12-14 DIAGNOSIS — Z888 Allergy status to other drugs, medicaments and biological substances status: Secondary | ICD-10-CM

## 2020-12-14 DIAGNOSIS — Z86010 Personal history of colonic polyps: Secondary | ICD-10-CM

## 2020-12-14 DIAGNOSIS — I10 Essential (primary) hypertension: Secondary | ICD-10-CM | POA: Diagnosis present

## 2020-12-14 DIAGNOSIS — Z79899 Other long term (current) drug therapy: Secondary | ICD-10-CM

## 2020-12-14 DIAGNOSIS — Z9884 Bariatric surgery status: Secondary | ICD-10-CM

## 2020-12-14 DIAGNOSIS — F32A Depression, unspecified: Secondary | ICD-10-CM | POA: Diagnosis present

## 2020-12-14 DIAGNOSIS — Z9049 Acquired absence of other specified parts of digestive tract: Secondary | ICD-10-CM

## 2020-12-14 DIAGNOSIS — Z8249 Family history of ischemic heart disease and other diseases of the circulatory system: Secondary | ICD-10-CM

## 2020-12-14 DIAGNOSIS — F418 Other specified anxiety disorders: Secondary | ICD-10-CM | POA: Diagnosis present

## 2020-12-14 DIAGNOSIS — R531 Weakness: Secondary | ICD-10-CM

## 2020-12-14 LAB — BASIC METABOLIC PANEL
Anion gap: 10 (ref 5–15)
Anion gap: 8 (ref 5–15)
BUN: 14 mg/dL (ref 8–23)
BUN: 16 mg/dL (ref 8–23)
CO2: 21 mmol/L — ABNORMAL LOW (ref 22–32)
CO2: 24 mmol/L (ref 22–32)
Calcium: 8.6 mg/dL — ABNORMAL LOW (ref 8.9–10.3)
Calcium: 8.5 mg/dL — ABNORMAL LOW (ref 8.9–10.3)
Chloride: 107 mmol/L (ref 98–111)
Chloride: 105 mmol/L (ref 98–111)
Creatinine, Ser: 0.88 mg/dL (ref 0.44–1.00)
Creatinine, Ser: 0.95 mg/dL (ref 0.44–1.00)
GFR, Estimated: >60 mL/min (ref >60)
GFR, Estimated: >60 mL/min (ref >60)
Glucose, Bld: 252 mg/dL — ABNORMAL HIGH (ref 70–99)
Glucose, Bld: 111 mg/dL — ABNORMAL HIGH (ref 70–99)
Potassium: 4 mmol/L (ref 3.5–5.1)
Potassium: 3.4 mmol/L — ABNORMAL LOW (ref 3.5–5.1)
Sodium: 138 mmol/L (ref 135–145)
Sodium: 137 mmol/L (ref 135–145)

## 2020-12-14 LAB — CBC
HCT: 34 % — ABNORMAL LOW (ref 36.0–46.0)
Hemoglobin: 11.2 g/dL — ABNORMAL LOW (ref 12.0–15.0)
MCH: 29.2 pg (ref 26.0–34.0)
MCHC: 32.9 g/dL (ref 30.0–36.0)
MCV: 88.5 fL (ref 80.0–100.0)
Platelets: 219 10*3/uL (ref 150–400)
RBC: 3.84 MIL/uL — ABNORMAL LOW (ref 3.87–5.11)
RDW: 14.1 % (ref 11.5–15.5)
WBC: 10.1 10*3/uL (ref 4.0–10.5)
nRBC: 0 % (ref 0.0–0.2)

## 2020-12-14 LAB — TROPONIN I (HIGH SENSITIVITY): Troponin I (High Sensitivity): 31 ng/L — ABNORMAL HIGH (ref <18)

## 2020-12-14 LAB — TSH: TSH: 10.624 u[IU]/mL — ABNORMAL HIGH (ref 0.350–4.500)

## 2020-12-14 LAB — MAGNESIUM: Magnesium: 2.2 mg/dL (ref 1.7–2.4)

## 2020-12-14 LAB — CK: Total CK: 48 U/L (ref 38–234)

## 2020-12-14 LAB — LACTIC ACID, PLASMA: Lactic Acid, Venous: 1.6 mmol/L (ref 0.5–1.9)

## 2020-12-14 IMAGING — CT CT HEAD W/O CM
3 series · 16 of 47 positions shown, 19 images · non-contrast
Comparison: None.

CLINICAL DATA: Generalized weakness, [XL], mental status change

EXAM:
CT HEAD WITHOUT CONTRAST
TECHNIQUE: Contiguous axial images were obtained from the base of the skull
through the vertex without intravenous contrast.

[Series 2: head wo · axial · 0.43mm/px · z∈[-143,-8]mm · 10 of 33 slices shown, 13 images]
[im 3/33  brain]
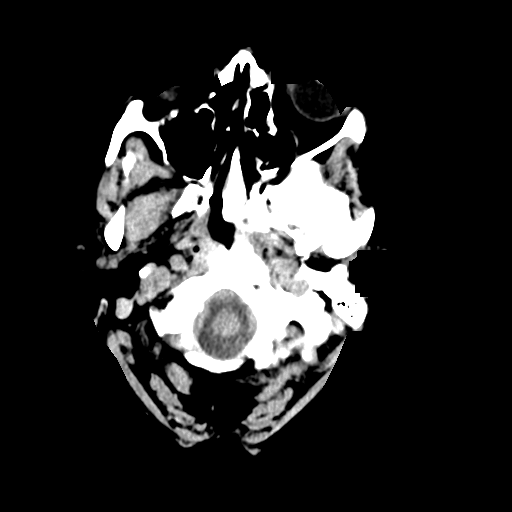
[im 3/33  bone]
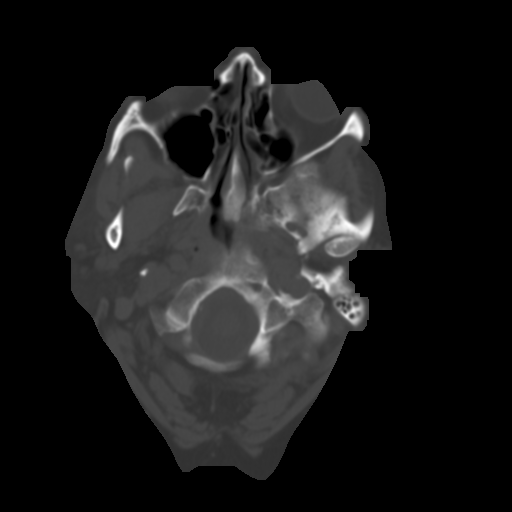
[im 6/33  brain]
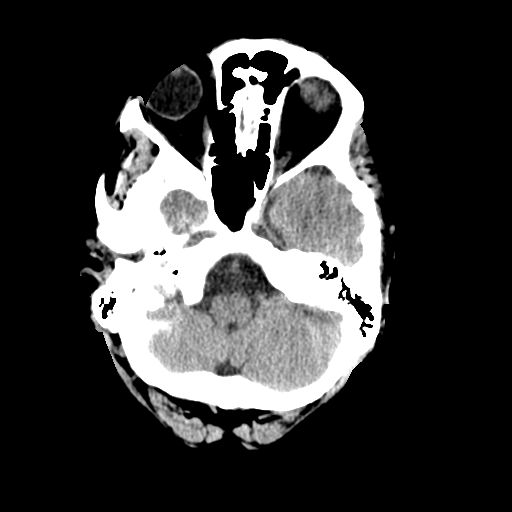
[im 9/33  brain]
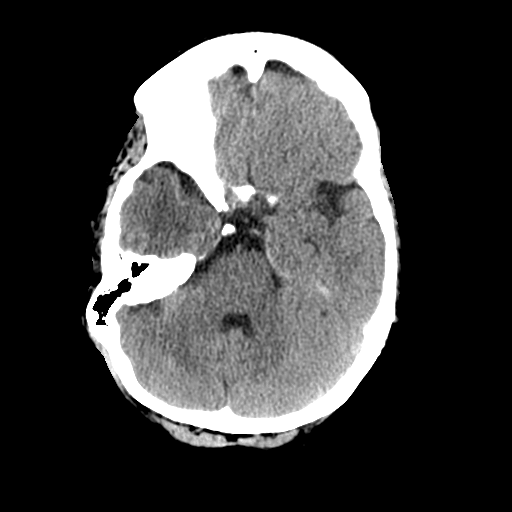
[im 12/33  brain]
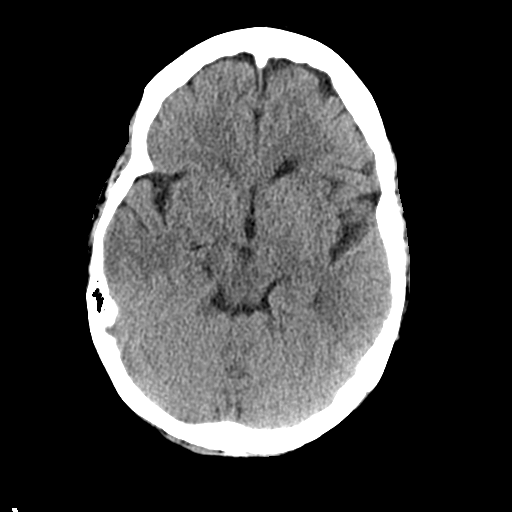
[im 15/33  brain]
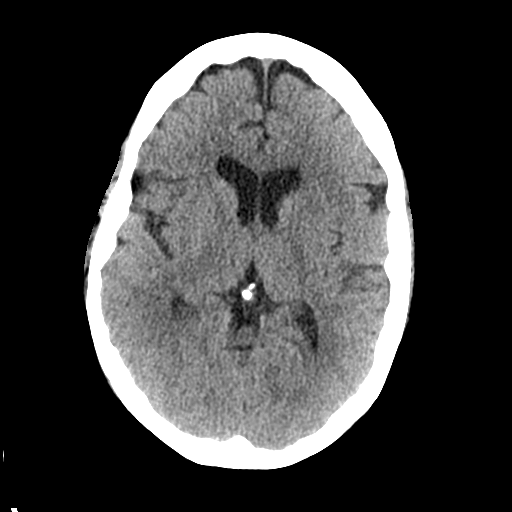
[im 15/33  bone]
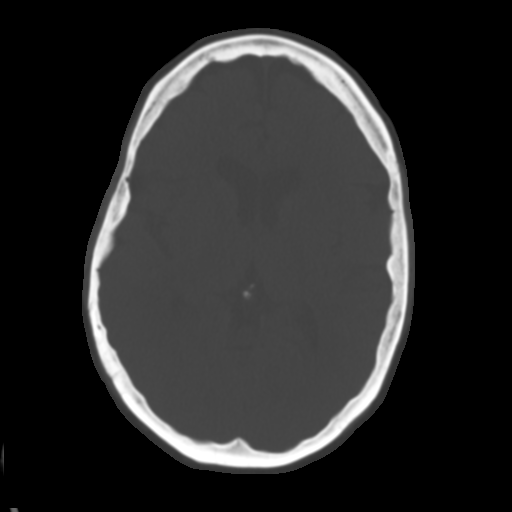
[im 18/33  brain]
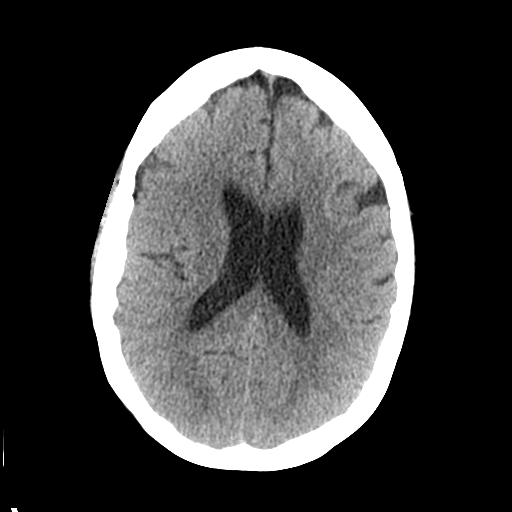
[im 21/33  brain]
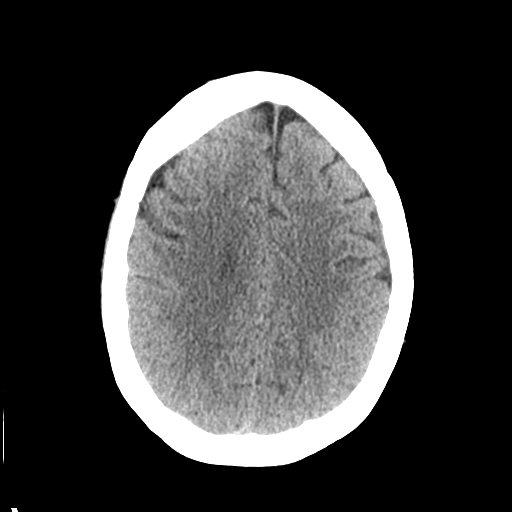
[im 25/33  brain]
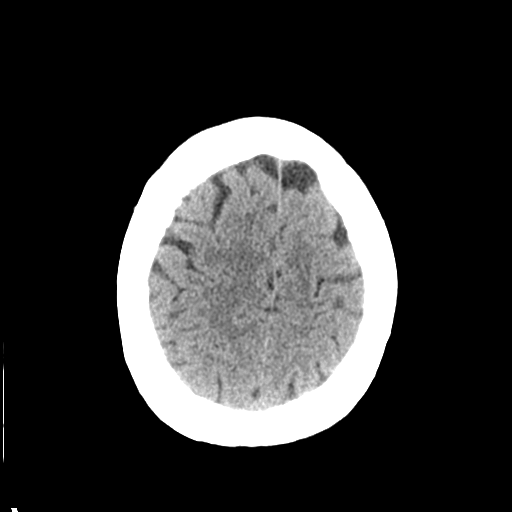
[im 27/33  brain]
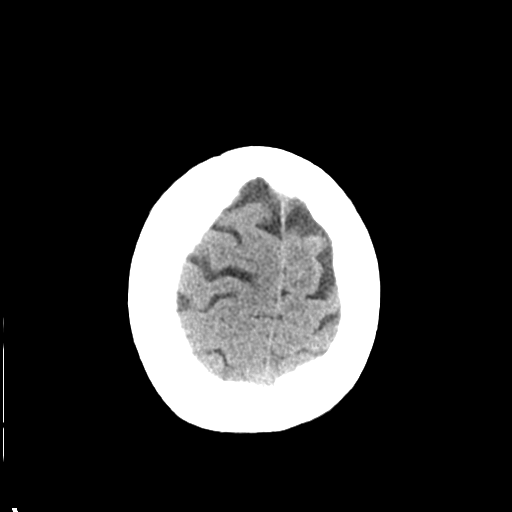
[im 27/33  bone]
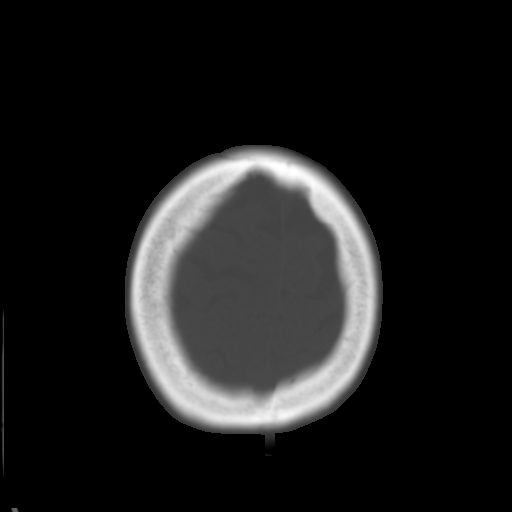
[im 30/33  brain]
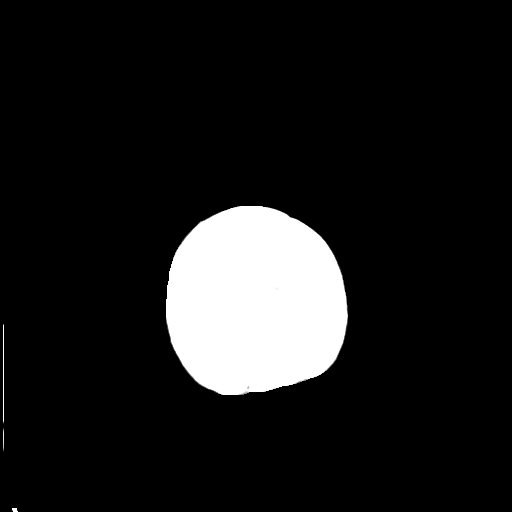

[Series 4: coronal soft tissue · coronal · 0.34mm/px · 3 of 72 slices shown]
[im 24/72  brain]
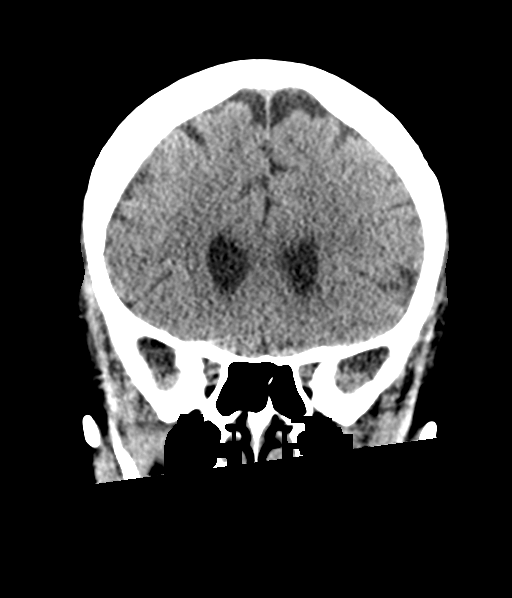
[im 32/72  brain]
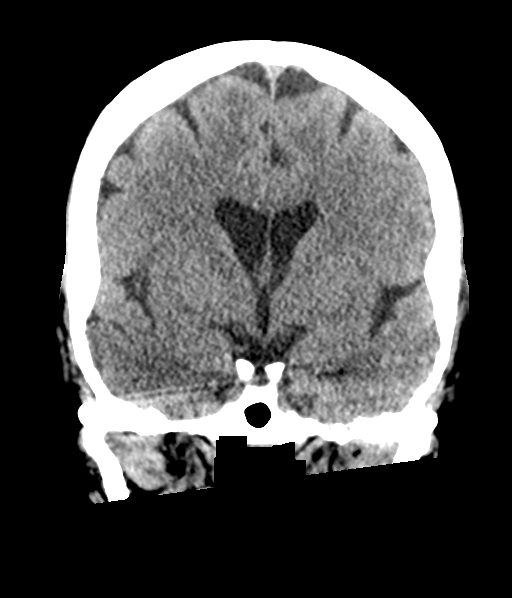
[im 40/72  brain]
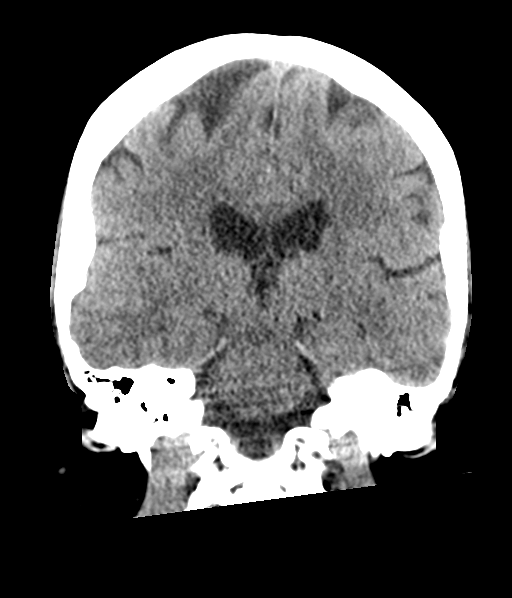

[Series 5: sagittal soft tissue · sagittal · 0.37mm/px · 3 of 56 slices shown]
[im 21/56  brain]
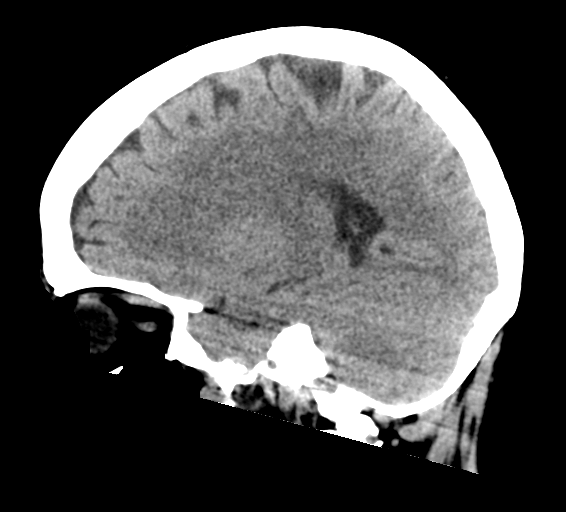
[im 28/56  brain]
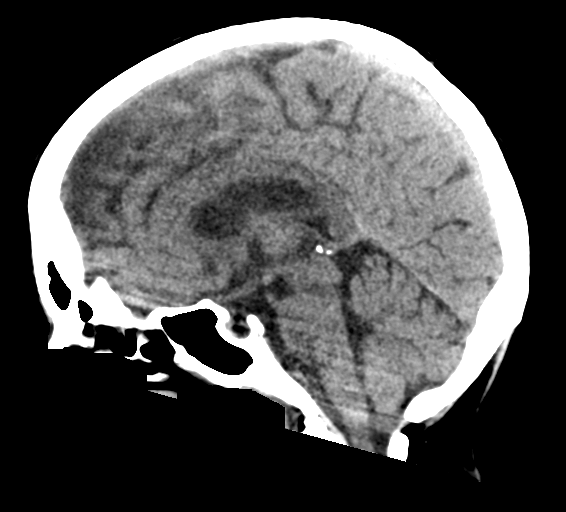
[im 36/56  brain]
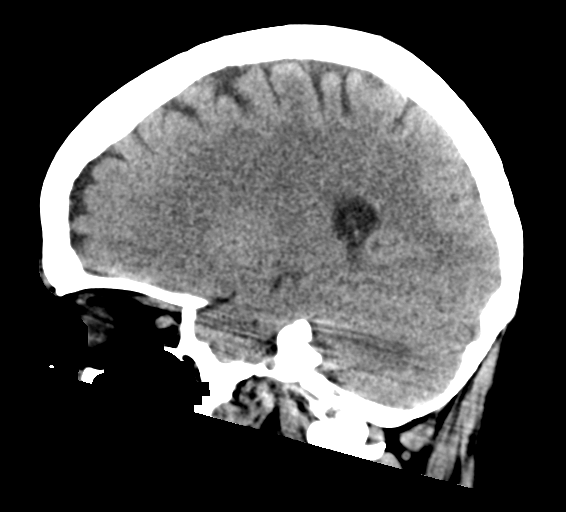

[16 of 47 positions shown; findings below may reference images not displayed]

FINDINGS: Brain: No acute infarct or hemorrhage. Lateral ventricles and
midline structures are unremarkable. No acute extra-axial fluid
collections. No mass effect.

Vascular: No hyperdense vessel or unexpected calcification.

Skull: Normal. Negative for fracture or focal lesion.

Sinuses/Orbits: No acute finding.

Other: None.
IMPRESSION: 1. No acute intracranial process.

## 2020-12-14 IMAGING — DX DG CHEST 1V PORT
1 series · 1 of 1 positions shown · non-contrast
Comparison: [DATE]

CLINICAL DATA: Generalized weakness, [4J]

EXAM:
PORTABLE CHEST 1 VIEW

[chest ap]
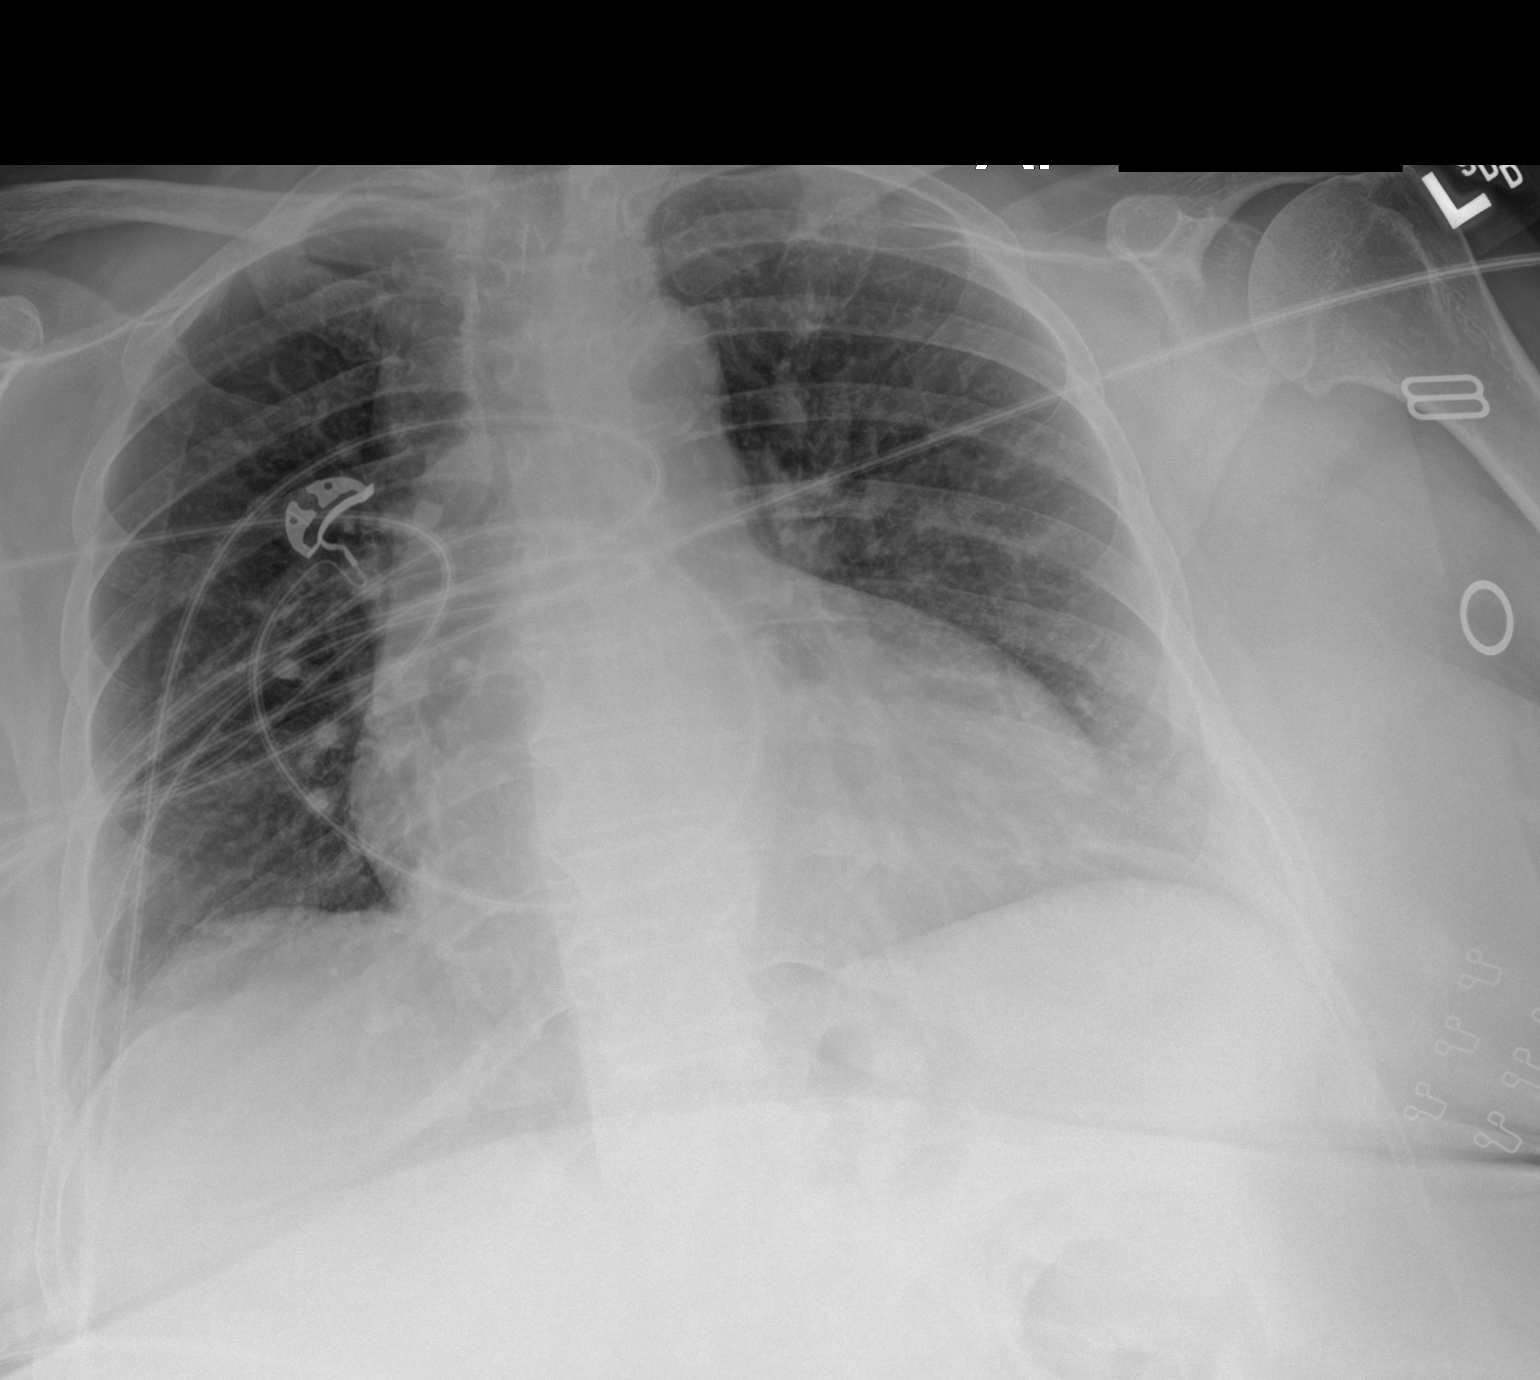

[1 of 1 positions shown; findings below may reference images not displayed]

FINDINGS: Single frontal view of the chest demonstrates an unremarkable
cardiac silhouette. No airspace disease, effusion, or pneumothorax.
No acute bony abnormalities.
IMPRESSION: 1. No acute intrathoracic process.

## 2020-12-14 MED ORDER — ASCORBIC ACID 500 MG PO TABS: 500.0000 mg | ORAL_TABLET | Freq: Every day | ORAL | 0 refills | Status: AC

## 2020-12-14 MED ORDER — ZINC SULFATE 220 (50 ZN) MG PO CAPS: 220.0000 mg | ORAL_CAPSULE | Freq: Every day | ORAL | 0 refills | Status: AC

## 2020-12-14 MED ORDER — DEXAMETHASONE 6 MG PO TABS: 6.0000 mg | ORAL_TABLET | Freq: Every day | ORAL | 0 refills | Status: AC

## 2020-12-14 MED ORDER — APIXABAN 5 MG PO TABS: 5.0000 mg | ORAL_TABLET | Freq: Two times a day (BID) | ORAL | 0 refills | Status: DC

## 2020-12-14 MED ORDER — SODIUM CHLORIDE 0.9 % IV BOLUS
1000.0000 mL | Freq: Once | INTRAVENOUS | Status: AC
  Administered 2020-12-14: 1000 mL via INTRAVENOUS

## 2020-12-14 NOTE — Progress Notes (Signed)
Patient gathered all items. Gave discharge instructions and walked out (refused wheelchair).

## 2020-12-14 NOTE — Discharge Summary (Signed)
Physician Discharge Summary  Patient ID: Ann Bowen MRN: [id number] DOB/AGE: [id number] 66 y.o.  Admit date: 12/12/2020 Discharge date: 12/14/2020  Admission Diagnoses:  Discharge Diagnoses:  Principal Problem:   Acute respiratory failure due to COVID-19 [facility]) Active Problems:   Fibromyalgia   Depression   AKI (acute kidney injury) ([place])   COVID-19 virus infection   Discharged Condition: good  Hospital Course:  Ann Bowen is a 65 y.o. female with medical history significant for Depression and anxiety, fibromyalgia who presents to the ED with generalized weakness, cough fever and chest pain on inspiration.  Has mild shortness of breath, denies leg pain she denies nausea, vomiting, abdominal pain and diarrhea she tested positive for COVID the day prior with her home testing kit. Patient developed hypoxemia, she was placed on oxygen.  She was also placed on remdesivir and IV steroids.  He received 1 L fluid bolus for acute renal failure.  #1.  Acute respiratory failure with hypoxemia. COVID-19 pneumonia. Patient condition had improved, off oxygen.  She has been treated with IV steroids and remdesivir.  At this time will discontinue remdesivir, continue oral steroids. Patient is also placed on therapeutic Lovenox for elevated D-dimer. I will continue anticoagulation with Eliquis 5 mg twice a day.  Patient does not have leg edema, I do not suspect a DVT.  Recent study showed anticoagulation in non-ICU COVID-patient had improved patient 30-day survival.  Planning to treat for at least 30 days with anticoagulation.   #2.  Acute kidney injury. Renal function has improved.  3.  Fibromyalgia. Depression. Continue home medicines.   Consults: None  Significant Diagnostic Studies:  CHEST - 2 VIEW   COMPARISON:  10/05/2019   FINDINGS: The heart size and mediastinal contours are within normal limits. Both lungs are clear. The visualized skeletal structures are unremarkable.    IMPRESSION: No active cardiopulmonary disease.     Electronically Signed   By: Marlaine Hind M.D.   On: 12/12/2020 23:07   Treatments: Remdesivir and IV steroids.  Discharge Exam: Blood pressure (!) 152/76, pulse (!) 54, temperature 98.2 F (36.8 C), resp. rate 14, height _0  (1.6 m), weight 93 kg, SpO2 100 %. General appearance: alert and cooperative Resp: clear to auscultation bilaterally Cardio: regular rate and rhythm, S1, S2 normal, no murmur, click, rub or gallop GI: soft, non-tender; bowel sounds normal; no masses,  no organomegaly Extremities: extremities normal, atraumatic, no cyanosis or edema  Disposition: Discharge disposition: 01-Home or Self Care       Discharge Instructions     Diet - low sodium heart healthy   Complete by: As directed    Increase activity slowly   Complete by: As directed       Allergies as of 12/14/2020       Reactions   Wellbutrin [bupropion] Hives, Rash        Medication List     STOP taking these medications    HYDROcodone-acetaminophen 5-325 MG tablet Commonly known as: NORCO/VICODIN   Suprep Bowel Prep Kit 17.5-3.13-1.6 GM/177ML Soln Generic drug: Na Sulfate-K Sulfate-Mg Sulf       TAKE these medications    apixaban 5 MG Tabs tablet Commonly known as: ELIQUIS Take 1 tablet (5 mg total) by mouth 2 (two) times daily.   ascorbic acid 500 MG tablet Commonly known as: VITAMIN C Take 1 tablet (500 mg total) by mouth daily for 14 days. Start taking on: December 15, 2020   betamethasone dipropionate 0.05 % ointment Commonly known as:  DIPROLENE Apply topically 2 (two) times daily as needed (Rash). Avoid face, groin, underarms.   bimatoprost 0.03 % ophthalmic solution Commonly known as: LATISSE Place 1 application into both eyes at bedtime. Place one drop on applicator and apply evenly along the skin of the upper eyelid at base of eyelashes once daily at bedtime; repeat procedure for second eye (use a clean  applicator).   clonazePAM 0.5 MG tablet Commonly known as: KLONOPIN Take 1-2 tablets by mouth 3 (three) times daily as needed.   dexamethasone 6 MG tablet Commonly known as: DECADRON Take 1 tablet (6 mg total) by mouth daily for 8 days.   diclofenac 75 MG EC tablet Commonly known as: VOLTAREN Take 75 mg by mouth 2 (two) times daily.   Diprolene 0.05 % ointment Generic drug: augmented betamethasone dipropionate Apply 1 application topically 2 (two) times daily.   doxepin 10 MG capsule Commonly known as: SINEQUAN 10 MG capsule Take 20 mg by mouth nightly   EPINEPHrine 0.3 mg/0.3 mL Soaj injection Commonly known as: EPI-PEN Inject into the muscle.   losartan 25 MG tablet Commonly known as: COZAAR Take 25 mg by mouth daily.   metroNIDAZOLE 0.75 % cream Commonly known as: METROCREAM Apply topically 2 (two) times daily.   Multi-Vitamin tablet Take 1 tablet by mouth daily.   phentermine 30 MG capsule Take 30 mg by mouth every morning.   rOPINIRole 1 MG tablet Commonly known as: REQUIP Take 1 mg by mouth in the morning and at bedtime.   tiZANidine 4 MG tablet Commonly known as: ZANAFLEX Take 4 mg by mouth at bedtime.   zinc sulfate 220 (50 Zn) MG capsule Take 1 capsule (220 mg total) by mouth daily for 14 days. Start taking on: December 15, 2020        [place] Follow up in 1 week(s).   Specialty: General Practice Contact information: [place] [address] [phone]                 Signed: Sharen Hones 12/14/2020, 10:38 AM
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
## 2020-12-14 NOTE — ED Notes (Signed)
Please update pt spouse, Percell Miller with updates on status. (number on file in the chart)

## 2020-12-14 NOTE — ED Notes (Signed)
Patient husband updated and notified

## 2020-12-14 NOTE — ED Triage Notes (Signed)
Pt presents to the ED from home with c/o generalized weakness. Pt states that she was admitted here after being diagnosed with Covid. Pt was discharged this morning and began feeling severe weakness at home.

## 2020-12-15 DIAGNOSIS — Z79899 Other long term (current) drug therapy: Secondary | ICD-10-CM | POA: Diagnosis not present

## 2020-12-15 DIAGNOSIS — Z86010 Personal history of colonic polyps: Secondary | ICD-10-CM | POA: Diagnosis not present

## 2020-12-15 DIAGNOSIS — Z8249 Family history of ischemic heart disease and other diseases of the circulatory system: Secondary | ICD-10-CM | POA: Diagnosis not present

## 2020-12-15 DIAGNOSIS — U099 Post covid-19 condition, unspecified: Secondary | ICD-10-CM | POA: Diagnosis present

## 2020-12-15 DIAGNOSIS — Z7901 Long term (current) use of anticoagulants: Secondary | ICD-10-CM | POA: Diagnosis not present

## 2020-12-15 DIAGNOSIS — I959 Hypotension, unspecified: Secondary | ICD-10-CM | POA: Diagnosis present

## 2020-12-15 DIAGNOSIS — R001 Bradycardia, unspecified: Secondary | ICD-10-CM | POA: Diagnosis present

## 2020-12-15 DIAGNOSIS — F32A Depression, unspecified: Secondary | ICD-10-CM | POA: Diagnosis present

## 2020-12-15 DIAGNOSIS — M797 Fibromyalgia: Secondary | ICD-10-CM

## 2020-12-15 DIAGNOSIS — I10 Essential (primary) hypertension: Secondary | ICD-10-CM | POA: Diagnosis present

## 2020-12-15 DIAGNOSIS — F418 Other specified anxiety disorders: Secondary | ICD-10-CM

## 2020-12-15 DIAGNOSIS — Z888 Allergy status to other drugs, medicaments and biological substances status: Secondary | ICD-10-CM | POA: Diagnosis not present

## 2020-12-15 DIAGNOSIS — F419 Anxiety disorder, unspecified: Secondary | ICD-10-CM | POA: Diagnosis present

## 2020-12-15 DIAGNOSIS — Z9049 Acquired absence of other specified parts of digestive tract: Secondary | ICD-10-CM | POA: Diagnosis not present

## 2020-12-15 DIAGNOSIS — Z9884 Bariatric surgery status: Secondary | ICD-10-CM | POA: Diagnosis not present

## 2020-12-15 LAB — CBC
HCT: 32 % — ABNORMAL LOW (ref 36.0–46.0)
Hemoglobin: 10.6 g/dL — ABNORMAL LOW (ref 12.0–15.0)
MCH: 29.2 pg (ref 26.0–34.0)
MCHC: 33.1 g/dL (ref 30.0–36.0)
MCV: 88.2 fL (ref 80.0–100.0)
Platelets: 151 10*3/uL (ref 150–400)
RBC: 3.63 MIL/uL — ABNORMAL LOW (ref 3.87–5.11)
RDW: 14.1 % (ref 11.5–15.5)
WBC: 6.4 10*3/uL (ref 4.0–10.5)
nRBC: 0 % (ref 0.0–0.2)

## 2020-12-15 LAB — BASIC METABOLIC PANEL
Anion gap: 3 — ABNORMAL LOW (ref 5–15)
BUN: 14 mg/dL (ref 8–23)
CO2: 24 mmol/L (ref 22–32)
Calcium: 8.2 mg/dL — ABNORMAL LOW (ref 8.9–10.3)
Chloride: 115 mmol/L — ABNORMAL HIGH (ref 98–111)
Creatinine, Ser: 0.85 mg/dL (ref 0.44–1.00)
GFR, Estimated: >60 mL/min (ref >60)
Glucose, Bld: 94 mg/dL (ref 70–99)
Potassium: 4.2 mmol/L (ref 3.5–5.1)
Sodium: 142 mmol/L (ref 135–145)

## 2020-12-15 LAB — URINALYSIS, COMPLETE (UACMP) WITH MICROSCOPIC
Bilirubin Urine: NEGATIVE
Glucose, UA: NEGATIVE mg/dL
Hgb urine dipstick: NEGATIVE
Ketones, ur: NEGATIVE mg/dL
Leukocytes,Ua: NEGATIVE
Nitrite: NEGATIVE
Protein, ur: NEGATIVE mg/dL
Specific Gravity, Urine: 1.006 (ref 1.005–1.030)
pH: 6 (ref 5.0–8.0)

## 2020-12-15 LAB — MAGNESIUM: Magnesium: 2.2 mg/dL (ref 1.7–2.4)

## 2020-12-15 LAB — T4, FREE: Free T4: 0.75 ng/dL (ref 0.61–1.12)

## 2020-12-15 LAB — TROPONIN I (HIGH SENSITIVITY): Troponin I (High Sensitivity): 30 ng/L — ABNORMAL HIGH (ref <18)

## 2020-12-15 MED ORDER — DEXAMETHASONE 6 MG PO TABS
6.0000 mg | ORAL_TABLET | Freq: Every day | ORAL | Status: DC
  Administered 2020-12-15: 6 mg via ORAL
  Filled 2020-12-15: qty 1

## 2020-12-15 MED ORDER — OXYCODONE HCL 5 MG PO TABS
5.0000 mg | ORAL_TABLET | Freq: Once | ORAL | Status: AC
Start: 2020-12-15 — End: 2020-12-15
  Administered 2020-12-15: 5 mg via ORAL
  Filled 2020-12-15: qty 1

## 2020-12-15 MED ORDER — POTASSIUM CHLORIDE CRYS ER 20 MEQ PO TBCR
40.0000 meq | EXTENDED_RELEASE_TABLET | Freq: Once | ORAL | Status: AC
  Administered 2020-12-15: 40 meq via ORAL
  Filled 2020-12-15: qty 2

## 2020-12-15 MED ORDER — ACETAMINOPHEN 325 MG PO TABS: 650.0000 mg | ORAL_TABLET | Freq: Four times a day (QID) | ORAL | Status: DC | PRN

## 2020-12-15 MED ORDER — SODIUM CHLORIDE 0.9% FLUSH
3.0000 mL | Freq: Two times a day (BID) | INTRAVENOUS | Status: DC
  Administered 2020-12-15 (×2): 3 mL via INTRAVENOUS

## 2020-12-15 MED ORDER — ATROPINE SULFATE 1 MG/10ML IJ SOSY: 0.5000 mg | PREFILLED_SYRINGE | INTRAMUSCULAR | Status: DC | PRN

## 2020-12-15 MED ORDER — ACETAMINOPHEN 650 MG RE SUPP: 650.0000 mg | Freq: Four times a day (QID) | RECTAL | Status: DC | PRN

## 2020-12-15 MED ORDER — SODIUM CHLORIDE 0.9 % IV SOLN: INTRAVENOUS | Status: AC

## 2020-12-15 MED ORDER — APIXABAN 5 MG PO TABS
5.0000 mg | ORAL_TABLET | Freq: Two times a day (BID) | ORAL | Status: DC
  Administered 2020-12-15: 5 mg via ORAL
  Filled 2020-12-15: qty 1

## 2020-12-15 MED ORDER — POLYVINYL ALCOHOL 1.4 % OP SOLN
1.0000 [drp] | OPHTHALMIC | Status: DC | PRN
  Filled 2020-12-15: qty 15

## 2020-12-15 MED ORDER — OXYCODONE HCL 5 MG PO TABS
5.0000 mg | ORAL_TABLET | Freq: Once | ORAL | Status: AC
  Administered 2020-12-15: 5 mg via ORAL
  Filled 2020-12-15: qty 1

## 2020-12-15 NOTE — Progress Notes (Signed)
Patient admitted with dizziness, refusing to use bsc at this time insisting on walking to restroom unattended. Patient refusing bed alarm.
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
## 2020-12-15 NOTE — ED Notes (Signed)
Lab at bedside

## 2020-12-15 NOTE — ED Notes (Addendum)
This RN and Pandora Leiter, NT answered pt call light. Pt stated "I want to leave." Pt has also complained about not having a bathroom and how the food is terrible, etc and wants to speak to the doctor. This RN message [place], MD about the situation and has given this RN permission to sign pt out AMA.
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
## 2020-12-17 ENCOUNTER — Telehealth: Payer: Self-pay | Admitting: Gastroenterology

## 2020-12-17 NOTE — Telephone Encounter (Signed)
LVM for pt to return my call to reschedule colonoscopy.

## 2020-12-17 NOTE — Telephone Encounter (Signed)
Patient wants to reschedule procedure. Clinical staff will follow up with patient.

## 2020-12-19 LAB — CULTURE, BLOOD (SINGLE)
Culture: NO GROWTH
Special Requests: ADEQUATE

## 2020-12-20 ENCOUNTER — Other Ambulatory Visit: Payer: Self-pay

## 2020-12-20 ENCOUNTER — Emergency Department
Admission: EM | Admit: 2020-12-20 | Discharge: 2020-12-20 | Disposition: A | Discharge status: Home / Self Care | Payer: Medicare Other | Attending: Emergency Medicine | Admitting: Emergency Medicine

## 2020-12-20 ENCOUNTER — Emergency Department: Payer: Medicare Other

## 2020-12-20 DIAGNOSIS — Z8616 Personal history of COVID-19: Secondary | ICD-10-CM | POA: Diagnosis not present

## 2020-12-20 DIAGNOSIS — U071 COVID-19: Secondary | ICD-10-CM

## 2020-12-20 DIAGNOSIS — Z7901 Long term (current) use of anticoagulants: Secondary | ICD-10-CM | POA: Diagnosis not present

## 2020-12-20 DIAGNOSIS — R531 Weakness: Secondary | ICD-10-CM

## 2020-12-20 LAB — CBC WITH DIFFERENTIAL/PLATELET
Abs Immature Granulocytes: 0.52 10*3/uL — ABNORMAL HIGH (ref 0.00–0.07)
Basophils Absolute: 0 10*3/uL (ref 0.0–0.1)
Basophils Relative: 0 %
Eosinophils Absolute: 0 10*3/uL (ref 0.0–0.5)
Eosinophils Relative: 0 %
HCT: 35.2 % — ABNORMAL LOW (ref 36.0–46.0)
Hemoglobin: 11.9 g/dL — ABNORMAL LOW (ref 12.0–15.0)
Immature Granulocytes: 6 %
Lymphocytes Relative: 33 %
Lymphs Abs: 2.9 10*3/uL (ref 0.7–4.0)
MCH: 29.2 pg (ref 26.0–34.0)
MCHC: 33.8 g/dL (ref 30.0–36.0)
MCV: 86.5 fL (ref 80.0–100.0)
Monocytes Absolute: 0.8 10*3/uL (ref 0.1–1.0)
Monocytes Relative: 9 %
Neutro Abs: 4.6 10*3/uL (ref 1.7–7.7)
Neutrophils Relative %: 52 %
Platelets: 255 10*3/uL (ref 150–400)
RBC: 4.07 MIL/uL (ref 3.87–5.11)
RDW: 13.7 % (ref 11.5–15.5)
Smear Review: NORMAL
WBC: 8.9 10*3/uL (ref 4.0–10.5)
nRBC: 0 % (ref 0.0–0.2)

## 2020-12-20 LAB — COMPREHENSIVE METABOLIC PANEL
ALT: 19 U/L (ref 0–44)
AST: 20 U/L (ref 15–41)
Albumin: 3.5 g/dL (ref 3.5–5.0)
Alkaline Phosphatase: 72 U/L (ref 38–126)
Anion gap: 9 (ref 5–15)
BUN: 20 mg/dL (ref 8–23)
CO2: 27 mmol/L (ref 22–32)
Calcium: 8.8 mg/dL — ABNORMAL LOW (ref 8.9–10.3)
Chloride: 99 mmol/L (ref 98–111)
Creatinine, Ser: 0.89 mg/dL (ref 0.44–1.00)
GFR, Estimated: >60 mL/min (ref >60)
Glucose, Bld: 112 mg/dL — ABNORMAL HIGH (ref 70–99)
Potassium: 3.8 mmol/L (ref 3.5–5.1)
Sodium: 135 mmol/L (ref 135–145)
Total Bilirubin: 1.1 mg/dL (ref 0.3–1.2)
Total Protein: 6.6 g/dL (ref 6.5–8.1)

## 2020-12-20 LAB — TROPONIN I (HIGH SENSITIVITY): Troponin I (High Sensitivity): 7 ng/L (ref <18)

## 2020-12-20 IMAGING — CR DG CHEST 2V
1 series · 2 of 2 positions shown · non-contrast
Comparison: Six days ago

CLINICAL DATA: COVID with worsening dyspnea

EXAM:
CHEST - 2 VIEW

[Series 1: dg chest 2 view · 0.14mm/px · 2 of 2 slices shown]
[im 1/2]
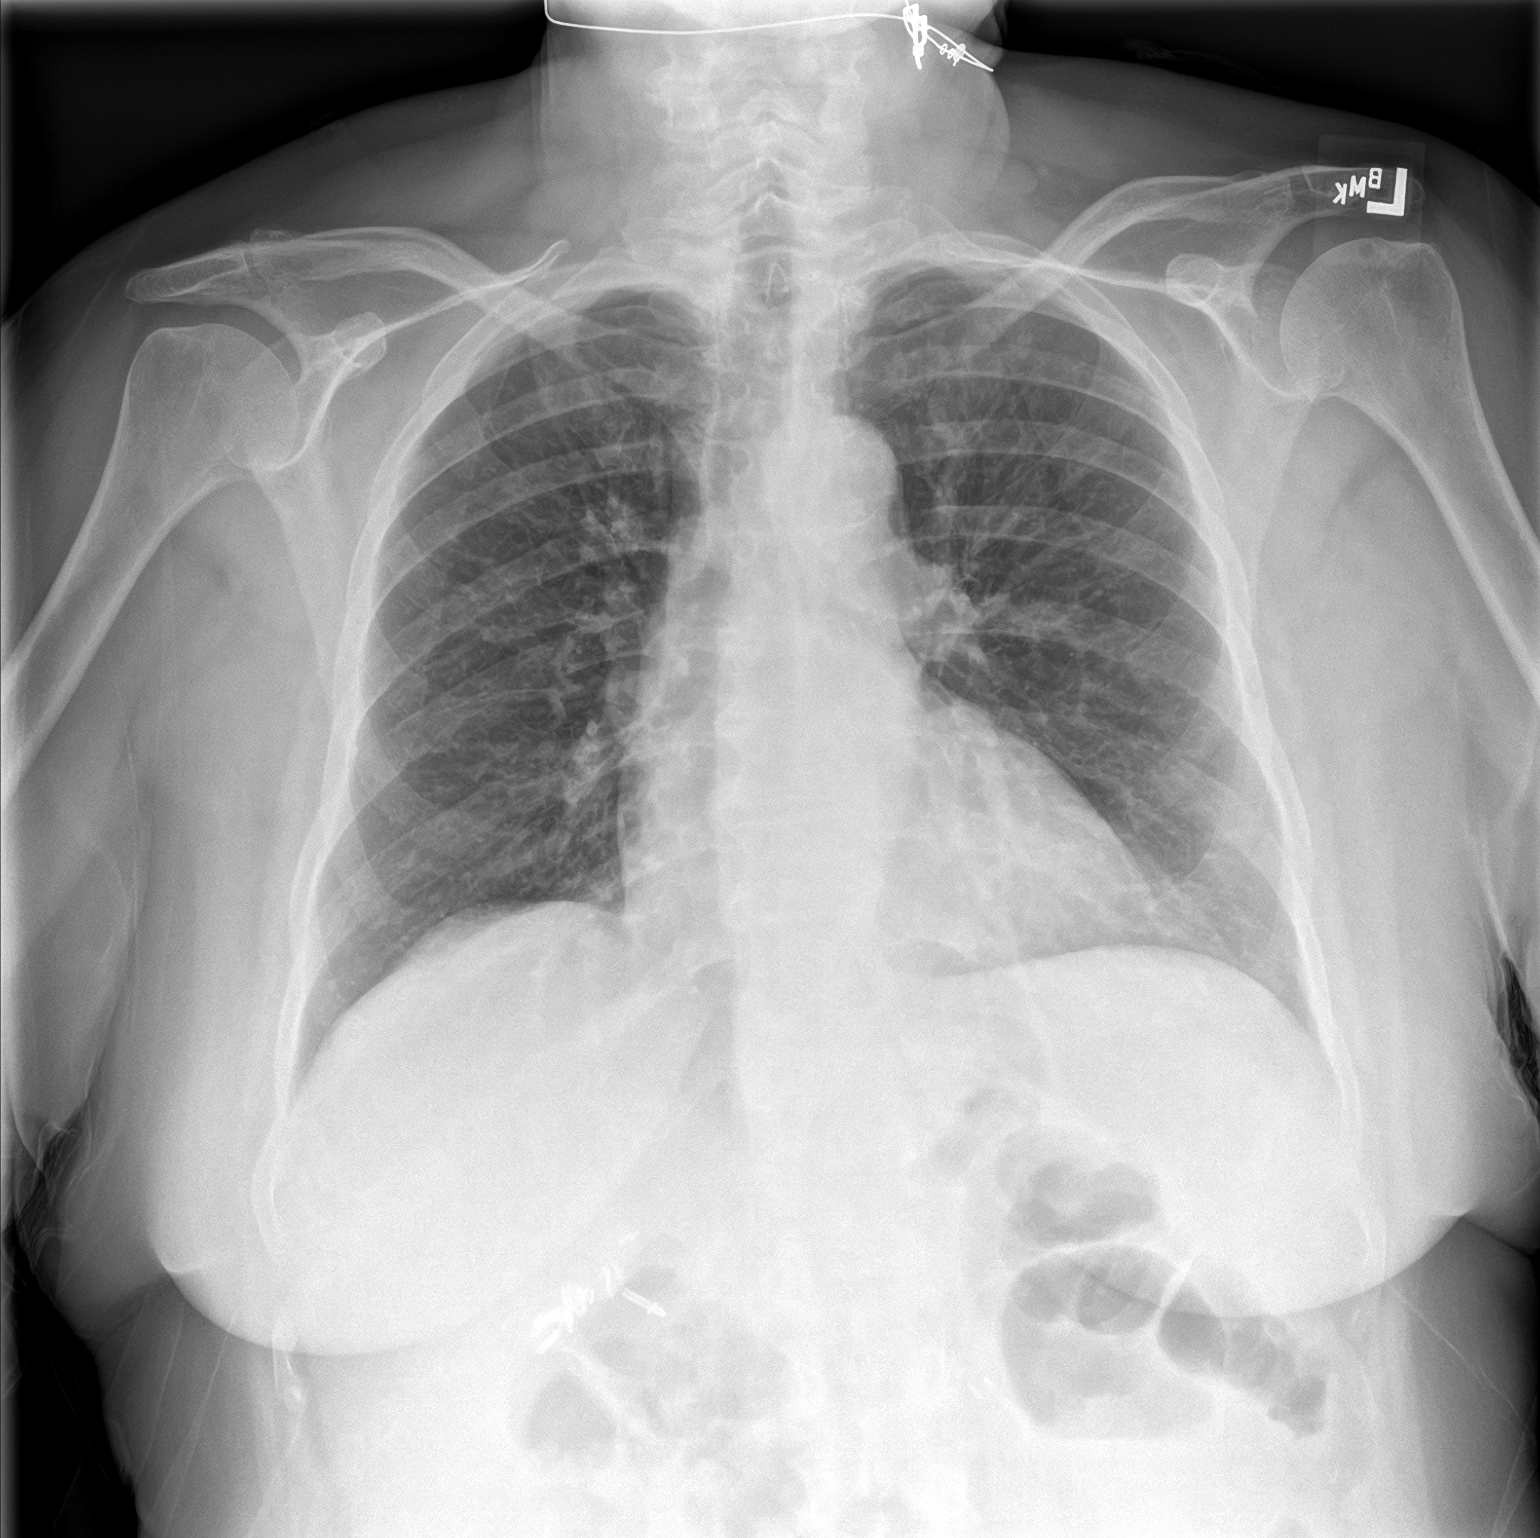
[im 2/2]
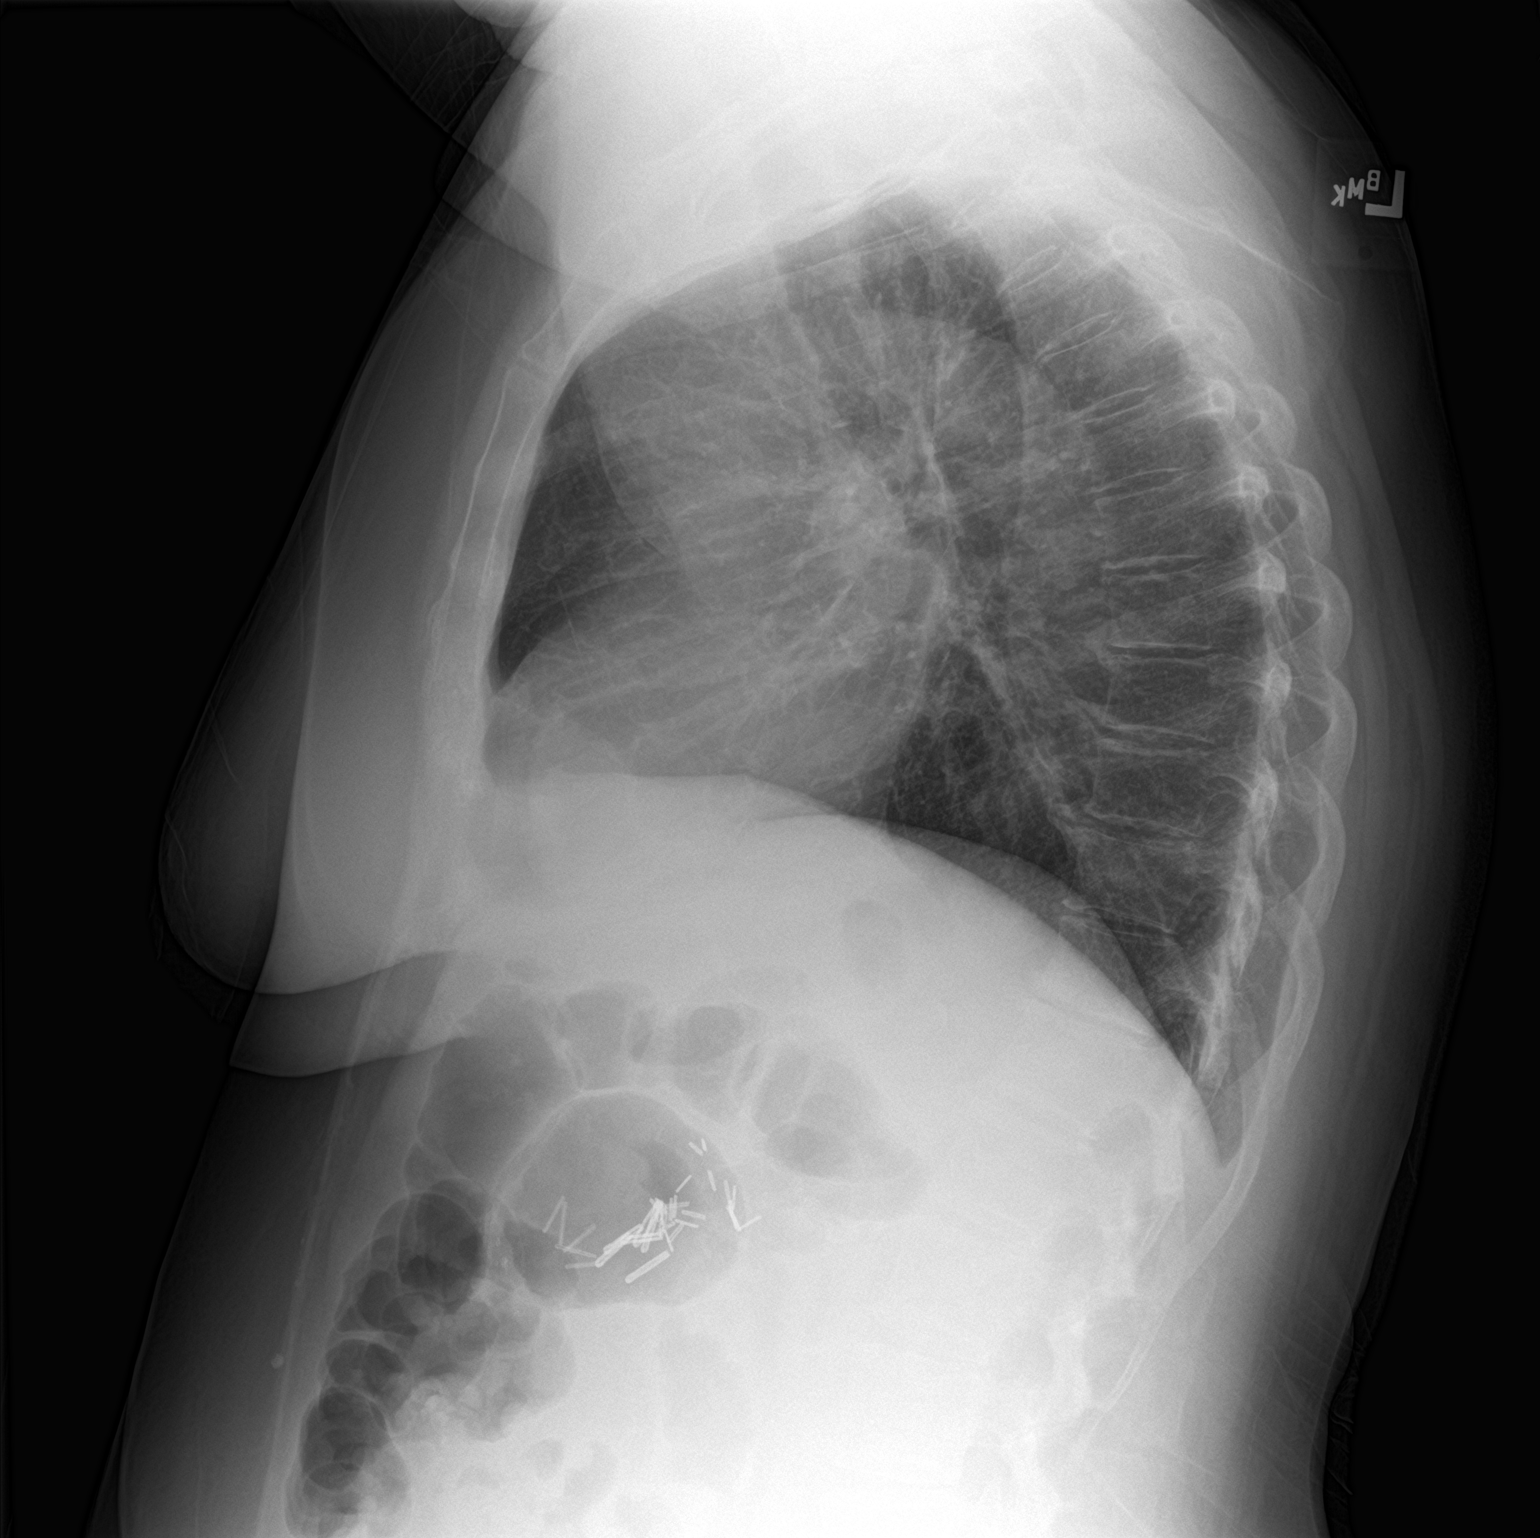

[2 of 2 positions shown; findings below may reference images not displayed]

FINDINGS: Low volume chest. No visible infiltrate-the lateral view is
especially clear appearing. No edema, effusion, or air leak. Normal
heart size and mediastinal contours. Postoperative right upper
quadrant.
IMPRESSION: Negative for pneumonia.

## 2020-12-20 MED ORDER — ACETAMINOPHEN 325 MG PO TABS
650.0000 mg | ORAL_TABLET | Freq: Once | ORAL | Status: AC
  Administered 2020-12-20: 650 mg via ORAL
  Filled 2020-12-20: qty 2

## 2020-12-20 MED ORDER — SODIUM CHLORIDE 0.9 % IV BOLUS
500.0000 mL | Freq: Once | INTRAVENOUS | Status: AC
  Administered 2020-12-20: 500 mL via INTRAVENOUS

## 2020-12-20 NOTE — ED Triage Notes (Signed)
Pt called from [facility] to treatment room, no response 
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
## 2020-12-20 NOTE — ED Notes (Signed)
See triage note  States she tested positive for COVID on 12/11/2020  States she still has a h/a and feels weak  Afebrile on arrival  Pt was asleep in the lobby prior to getting to room

## 2020-12-20 NOTE — ED Provider Notes (Signed)
Emergency Medicine Provider Triage Evaluation Note  Ann Bowen , a 65 y.o. female  was evaluated in triage.  Pt complains of shortness of breath.  Previously diagnosed with COVID, multiple recent ED visits.  She says she is here tonight because she "feels like shit" and " no one told me how to use the equipment I was at home with [incentive spirometer]."  Review of Systems  Positive: Shortness of breath Negative: Vomiting  Physical Exam  BP 114/62 (BP Location: Right Arm)   Pulse (!) 48   Temp 97.6 F (36.4 C) (Oral)   Resp 18   Ht 1.6 m ('5\' 3"'$ )   Wt 93 kg   SpO2 99%   BMI 36.31 kg/m  Gen:   Awake, no distress   Resp:  Normal effort  MSK:   Moves extremities without difficulty   Medical Decision Making  Medically screening exam initiated at 2:50 AM.  Appropriate orders placed.  Shirelle Gilligan was informed that the remainder of the evaluation will be completed by another provider, this initial triage assessment does not replace that evaluation, and the importance of remaining in the ED until their evaluation is complete.     Hinda Kehr, MD 12/20/20 [phone]

## 2020-12-20 NOTE — ED Notes (Addendum)
Dr. Karma Greaser stopped in on pt. In triage, and notified RN that if anything needed to be ordered, he would do it, but he did not anticipate blood work or imaging. Dr. Karma Greaser aware of pt's HR 48 bpm.

## 2020-12-20 NOTE — Discharge Instructions (Addendum)
To use your spirometer, you will inhale and raise the level up as high as possible.  Do this several times daily.  He should also try to walk to help clear your lungs.  Take vitamin C, vitamin D, and zinc.  These will help boost your immune system and make you feel better.  At this time all of your labs and x-rays are normal.  You do not need to be admitted.  Follow-up with your regular doctor if not improving in 3 to 4 days.  Your kidney functions are normal so you do not have kidney damage.

## 2020-12-20 NOTE — ED Triage Notes (Signed)
C/o persistent, increasing weakness and SOB, pt. Had positive covid test 7/30. Pt. Has been seen in ED multiple times for same.

## 2020-12-20 NOTE — ED Triage Notes (Signed)
Pt called x's 3 from [facility] to treatment room, no response

## 2020-12-24 NOTE — Telephone Encounter (Signed)
Sent pt a message about calling to reschedule her colonoscopy.

## 2020-12-31 ENCOUNTER — Encounter: Payer: Self-pay | Admitting: Gastroenterology

## 2021-01-14 ENCOUNTER — Ambulatory Visit: Payer: Medicare Other | Admitting: Anesthesiology

## 2021-01-14 ENCOUNTER — Encounter
Admission: RE | Disposition: A | Discharge status: Home / Self Care | Payer: Self-pay | Source: Home / Self Care | Attending: Gastroenterology

## 2021-01-14 ENCOUNTER — Other Ambulatory Visit: Payer: Self-pay

## 2021-01-14 ENCOUNTER — Encounter: Payer: Self-pay | Admitting: Gastroenterology

## 2021-01-14 ENCOUNTER — Ambulatory Visit
Admission: RE | Admit: 2021-01-14 | Discharge: 2021-01-14 | Disposition: A | Discharge status: Home / Self Care | Payer: Medicare Other | Attending: Gastroenterology | Admitting: Gastroenterology

## 2021-01-14 DIAGNOSIS — Z888 Allergy status to other drugs, medicaments and biological substances status: Secondary | ICD-10-CM | POA: Diagnosis not present

## 2021-01-14 DIAGNOSIS — Z860100 Personal history of colon polyps, unspecified: Secondary | ICD-10-CM

## 2021-01-14 DIAGNOSIS — Z79899 Other long term (current) drug therapy: Secondary | ICD-10-CM | POA: Insufficient documentation

## 2021-01-14 DIAGNOSIS — Z9884 Bariatric surgery status: Secondary | ICD-10-CM | POA: Insufficient documentation

## 2021-01-14 DIAGNOSIS — K640 First degree hemorrhoids: Secondary | ICD-10-CM | POA: Insufficient documentation

## 2021-01-14 DIAGNOSIS — Z7901 Long term (current) use of anticoagulants: Secondary | ICD-10-CM | POA: Insufficient documentation

## 2021-01-14 DIAGNOSIS — Z1211 Encounter for screening for malignant neoplasm of colon: Secondary | ICD-10-CM | POA: Diagnosis not present

## 2021-01-14 DIAGNOSIS — K635 Polyp of colon: Secondary | ICD-10-CM

## 2021-01-14 HISTORY — PX: COLONOSCOPY: SHX5424

## 2021-01-14 SURGERY — COLONOSCOPY
Anesthesia: General

## 2021-01-14 MED ORDER — ACETAMINOPHEN 325 MG PO TABS: 325.0000 mg | ORAL_TABLET | ORAL | Status: DC | PRN

## 2021-01-14 MED ORDER — ONDANSETRON HCL 4 MG/2ML IJ SOLN: 4.0000 mg | Freq: Once | INTRAMUSCULAR | Status: DC | PRN

## 2021-01-14 MED ORDER — SODIUM CHLORIDE 0.9 % IV SOLN: INTRAVENOUS | Status: DC

## 2021-01-14 MED ORDER — STERILE WATER FOR IRRIGATION IR SOLN
Status: DC | PRN
  Administered 2021-01-14: 150 mL

## 2021-01-14 MED ORDER — LIDOCAINE HCL (CARDIAC) PF 100 MG/5ML IV SOSY
PREFILLED_SYRINGE | INTRAVENOUS | Status: DC | PRN
  Administered 2021-01-14: 30 mg via INTRAVENOUS

## 2021-01-14 MED ORDER — LACTATED RINGERS IV SOLN: INTRAVENOUS | Status: DC

## 2021-01-14 MED ORDER — ACETAMINOPHEN 160 MG/5ML PO SOLN: 325.0000 mg | ORAL | Status: DC | PRN

## 2021-01-14 MED ORDER — PROPOFOL 10 MG/ML IV BOLUS
INTRAVENOUS | Status: DC | PRN
  Administered 2021-01-14: 20 mg via INTRAVENOUS
  Administered 2021-01-14: 150 mg via INTRAVENOUS
  Administered 2021-01-14: 30 mg via INTRAVENOUS
  Administered 2021-01-14: 40 mg via INTRAVENOUS
  Administered 2021-01-14: 50 mg via INTRAVENOUS

## 2021-01-14 SURGICAL SUPPLY — 22 items
CLIP HMST 235XBRD CATH ROT (MISCELLANEOUS) IMPLANT
CLIP RESOLUTION 360 11X235 (MISCELLANEOUS)
ELECT REM PT RETURN 9FT ADLT (ELECTROSURGICAL)
ELECTRODE REM PT RTRN 9FT ADLT (ELECTROSURGICAL) IMPLANT
FORCEPS BIOP RAD 4 LRG CAP 4 (CUTTING FORCEPS) ×1 IMPLANT
GOWN CVR UNV OPN BCK APRN NK (MISCELLANEOUS) ×2 IMPLANT
GOWN ISOL THUMB LOOP REG UNIV (MISCELLANEOUS) ×4
INJECTOR VARIJECT VIN23 (MISCELLANEOUS) IMPLANT
KIT DEFENDO VALVE AND CONN (KITS) IMPLANT
KIT PRC NS LF DISP ENDO (KITS) ×1 IMPLANT
KIT PROCEDURE OLYMPUS (KITS) ×2
MANIFOLD NEPTUNE II (INSTRUMENTS) ×2 IMPLANT
MARKER SPOT ENDO TATTOO 5ML (MISCELLANEOUS) IMPLANT
PROBE APC STR FIRE (PROBE) IMPLANT
RETRIEVER NET ROTH 2.5X230 LF (MISCELLANEOUS) IMPLANT
SNARE COLD EXACTO (MISCELLANEOUS) IMPLANT
SNARE SHORT THROW 13M SML OVAL (MISCELLANEOUS) IMPLANT
SNARE SNG USE RND 15MM (INSTRUMENTS) IMPLANT
SPOT EX ENDOSCOPIC TATTOO (MISCELLANEOUS)
TRAP ETRAP POLY (MISCELLANEOUS) IMPLANT
VARIJECT INJECTOR VIN23 (MISCELLANEOUS)
WATER STERILE IRR 250ML POUR (IV SOLUTION) ×2 IMPLANT

## 2021-01-14 NOTE — Anesthesia Procedure Notes (Signed)
Date/Time: 01/14/2021 9:50 AM Performed by: Cameron Ali, CRNA Pre-anesthesia Checklist: Patient identified, Emergency Drugs available, Suction available, Timeout performed and Patient being monitored Patient Re-evaluated:Patient Re-evaluated prior to induction Oxygen Delivery Method: Nasal cannula Placement Confirmation: positive ETCO2

## 2021-01-14 NOTE — Transfer of Care (Signed)
Immediate Anesthesia Transfer of Care Note  Patient: Ann Bowen  Procedure(s) Performed: COLONOSCOPY  Patient Location: PACU  Anesthesia Type: General  Level of Consciousness: awake, alert  and patient cooperative  Airway and Oxygen Therapy: Patient Spontanous Breathing and Patient connected to supplemental oxygen  Post-op Assessment: Post-op Vital signs reviewed, Patient's Cardiovascular Status Stable, Respiratory Function Stable, Patent Airway and No signs of Nausea or vomiting  Post-op Vital Signs: Reviewed and stable  Complications: No notable events documented.

## 2021-01-14 NOTE — H&P (Signed)
[name], MD [facility] [address], [place], [place] [address] [phone] Fax : [phone]  Primary Care Physician:  [facility], [facility] Primary Gastroenterologist:  Dr. Allen Norris  Pre-Procedure History & Physical: HPI:  Ann Bowen is a 65 y.o. female is here for an colonoscopy.   Past Medical History:  Diagnosis Date   Arthritis    Borderline personality disorder ([place])    Depression    Fibromyalgia    Shingles     Past Surgical History:  Procedure Laterality Date   CHOLECYSTECTOMY     COLONOSCOPY WITH PROPOFOL N/A 01/01/2018   Procedure: COLONOSCOPY WITH PROPOFOL;  Surgeon: Lin Landsman, MD;  Location: [facility] ENDOSCOPY;  Service: Gastroenterology;  Laterality: N/A;   GASTRIC BYPASS     SHOULDER SURGERY      Prior to Admission medications   Medication Sig Start Date End Date Taking? Authorizing Provider  apixaban (ELIQUIS) 5 MG TABS tablet Take 1 tablet (5 mg total) by mouth 2 (two) times daily. 12/14/20  Yes Sharen Hones, MD  clonazePAM (KLONOPIN) 0.5 MG tablet Take 1-2 tablets by mouth 3 (three) times daily as needed. 12/07/20  Yes [provider]  doxepin (SINEQUAN) 10 MG capsule 10 MG capsule Take 20 mg by mouth nightly 09/18/19  Yes [provider]  losartan (COZAAR) 25 MG tablet Take 25 mg by mouth daily. 12/02/20  Yes [provider]  phentermine 30 MG capsule Take 30 mg by mouth every morning.   Yes [provider]  rOPINIRole (REQUIP) 1 MG tablet Take 1 mg by mouth in the morning and at bedtime.    Yes [provider]  tiZANidine (ZANAFLEX) 4 MG tablet Take 4 mg by mouth at bedtime. 12/01/20  Yes [provider]  betamethasone dipropionate (DIPROLENE) 0.05 % ointment Apply topically 2 (two) times daily as needed (Rash). Avoid face, groin, underarms. Patient not taking: Reported on 01/14/2021 11/19/20   Brendolyn Patty, MD  bimatoprost (LATISSE) 0.03 % ophthalmic solution Place 1 application  into both eyes at bedtime. Place one drop on applicator and apply evenly along the skin of the upper eyelid at base of eyelashes once daily at bedtime; repeat procedure for second eye (use a clean applicator). Patient not taking: Reported on 01/14/2021 11/19/20   Brendolyn Patty, MD  diclofenac (VOLTAREN) 75 MG EC tablet Take 75 mg by mouth 2 (two) times daily. Patient not taking: Reported on 01/14/2021    [provider]  DIPROLENE 0.05 % ointment Apply 1 application topically 2 (two) times daily. Patient not taking: Reported on 01/14/2021 11/20/20   [provider]  EPINEPHrine 0.3 mg/0.3 mL IJ SOAJ injection Inject into the muscle.    [provider]  metroNIDAZOLE (METROCREAM) 0.75 % cream Apply topically 2 (two) times daily. Patient not taking: Reported on 01/14/2021 11/19/20 11/19/21  Brendolyn Patty, MD    Allergies as of 11/30/2020 - Review Complete 11/19/2020  Allergen Reaction Noted   Wellbutrin [bupropion] Hives and Rash 12/06/2014    Family History  Problem Relation Age of Onset   Diabetes Mellitus II Mother    Diabetes Mellitus II Father    Hypertension Father    Diabetes Mellitus II Sister    Breast cancer Neg Hx     Social History   Socioeconomic History   Marital status: Married    Spouse name: Not on file   Number of children: Not on file   Years of education: Not on file   Highest education level:  Not on file  Occupational History   Not on file  Tobacco Use   Smoking status: Never   Smokeless tobacco: Never  Vaping Use   Vaping Use: Never used  Substance and Sexual Activity   Alcohol use: Yes    Comment: occasionaly   Drug use: Never   Sexual activity: Not on file  Other Topics Concern   Not on file  Social History Narrative   ** Merged History Encounter **       Social Determinants of Health   Financial Resource Strain: Not on file  Food Insecurity: Not on file  Transportation Needs: Not on file  Physical Activity: Not on file   Stress: Not on file  Social Connections: Not on file  Intimate Partner Violence: Not on file    Review of Systems: See HPI, otherwise negative ROS  Physical Exam: BP 131/68   Pulse 73   Temp (!) 97.3 F (36.3 C) (Temporal)   Ht '5\' 3"'$  (1.6 m)   Wt 90.3 kg   SpO2 100%   BMI 35.25 kg/m  General:   Alert,  pleasant and cooperative in NAD Head:  Normocephalic and atraumatic. Neck:  Supple; no masses or thyromegaly. Lungs:  Clear throughout to auscultation.    Heart:  Regular rate and rhythm. Abdomen:  Soft, nontender and nondistended. Normal bowel sounds, without guarding, and without rebound.   Neurologic:  Alert and  oriented x4;  grossly normal neurologically.  Impression/Plan: Ann Bowen is here for an colonoscopy to be performed for a history of adenomatous polyps on 2019   Risks, benefits, limitations, and alternatives regarding  colonoscopy have been reviewed with the patient.  Questions have been answered.  All parties agreeable.   [name], MD  01/14/2021, 9:42 AM

## 2021-01-14 NOTE — Anesthesia Preprocedure Evaluation (Addendum)
Anesthesia Evaluation  Patient identified by MRN, date of birth, ID band Patient awake    Reviewed: Allergy & Precautions, NPO status   Airway Mallampati: II  TM Distance: >3 FB     Dental   Pulmonary    Pulmonary exam normal        Cardiovascular  Rhythm:Regular Rate:Normal     Neuro/Psych PSYCHIATRIC DISORDERS Anxiety Depression    GI/Hepatic   Endo/Other  Obesity - BMI 35  Renal/GU      Musculoskeletal  (+) Arthritis , Fibromyalgia -  Abdominal   Peds  Hematology   Anesthesia Other Findings   Reproductive/Obstetrics                             Anesthesia Physical Anesthesia Plan  ASA: 2  Anesthesia Plan: General   Post-op Pain Management:    Induction: Intravenous  PONV Risk Score and Plan: Propofol infusion, TIVA and Treatment may vary due to age or medical condition  Airway Management Planned: Natural Airway and Nasal Cannula  Additional Equipment:   Intra-op Plan:   Post-operative Plan:   Informed Consent: I have reviewed the patients History and Physical, chart, labs and discussed the procedure including the risks, benefits and alternatives for the proposed anesthesia with the patient or authorized representative who has indicated his/her understanding and acceptance.       Plan Discussed with: CRNA  Anesthesia Plan Comments:        Anesthesia Quick Evaluation

## 2021-01-14 NOTE — Anesthesia Postprocedure Evaluation (Signed)
Anesthesia Post Note  Patient: Ann Bowen  Procedure(s) Performed: COLONOSCOPY     Patient location during evaluation: PACU Anesthesia Type: General Level of consciousness: awake Pain management: pain level controlled Vital Signs Assessment: post-procedure vital signs reviewed and stable Respiratory status: respiratory function stable Cardiovascular status: stable Postop Assessment: no signs of nausea or vomiting Anesthetic complications: no   No notable events documented.  Veda Canning

## 2021-01-14 NOTE — Op Note (Signed)
[facility] Gastroenterology Patient Name: Ann Bowen Procedure Date: 01/14/2021 9:45 AM MRN: [id number] Account #: [id number] Date of Birth: December 01, 1955 Admit Type: Outpatient Age: 65 Room: [facility] OR ROOM 01 Gender: Female Note Status: Finalized Procedure:             Colonoscopy Indications:           High risk colon cancer surveillance: Personal history                         of colonic polyps Providers:             [name] MD, MD Referring MD:          [place], MD (Referring MD) Medicines:             Propofol per Anesthesia Complications:         No immediate complications. Procedure:             Pre-Anesthesia Assessment:                        - Prior to the procedure, a History and Physical was                         performed, and patient medications and allergies were                         reviewed. The patient's tolerance of previous                         anesthesia was also reviewed. The risks and benefits                         of the procedure and the sedation options and risks                         were discussed with the patient. All questions were                         answered, and informed consent was obtained. Prior                         Anticoagulants: The patient has taken no previous                         anticoagulant or antiplatelet agents. ASA Grade                         Assessment: II - A patient with mild systemic disease.                         After reviewing the risks and benefits, the patient                         was deemed in satisfactory condition to undergo the                         procedure.  After obtaining informed consent, the colonoscope was                         passed under direct vision. Throughout the procedure,                         the patient's blood pressure, pulse, and oxygen                         saturations were monitored  continuously. The was                         introduced through the anus and advanced to the the                         cecum, identified by appendiceal orifice and ileocecal                         valve. The colonoscopy was performed without                         difficulty. The patient tolerated the procedure well.                         The quality of the bowel preparation was good. Findings:      The perianal and digital rectal examinations were normal.      A 2 mm polyp was found in the transverse colon. The polyp was sessile.       The polyp was removed with a cold biopsy forceps. Resection and       retrieval were complete.      Non-bleeding internal hemorrhoids were found during retroflexion. The       hemorrhoids were Grade I (internal hemorrhoids that do not prolapse). Impression:            - One 2 mm polyp in the transverse colon, removed with                         a cold biopsy forceps. Resected and retrieved.                        - Non-bleeding internal hemorrhoids. Recommendation:        - Discharge patient to home.                        - Resume previous diet.                        - Continue present medications.                        - Await pathology results.                        - Repeat colonoscopy in 7 years for surveillance. Procedure Code(s):     --- Professional ---                        [phone], Colonoscopy, flexible; with biopsy, single or  multiple Diagnosis Code(s):     --- Professional ---                        Z86.010, Personal history of colonic polyps                        K63.5, Polyp of colon CPT copyright 2019 American Medical Association. All rights reserved. The codes documented in this report are preliminary and upon coder review may  be revised to meet current compliance requirements. [name] MD, MD 01/14/2021 10:09:24 AM This report has been signed electronically. Number of Addenda: 0 Note Initiated On:  01/14/2021 9:45 AM Scope Withdrawal Time: 0 hours 9 minutes 35 seconds  Total Procedure Duration: 0 hours 13 minutes 21 seconds  Estimated Blood Loss:  Estimated blood loss: none.      [facility]

## 2021-01-15 ENCOUNTER — Encounter: Payer: Self-pay | Admitting: Gastroenterology

## 2021-01-15 LAB — SURGICAL PATHOLOGY

## 2021-01-17 ENCOUNTER — Encounter: Payer: Self-pay | Admitting: Gastroenterology

## 2021-07-23 ENCOUNTER — Other Ambulatory Visit: Payer: Self-pay

## 2021-07-23 ENCOUNTER — Encounter: Payer: Self-pay | Admitting: Emergency Medicine

## 2021-07-23 ENCOUNTER — Emergency Department: Payer: Medicare Other

## 2021-07-23 ENCOUNTER — Emergency Department
Admission: EM | Admit: 2021-07-23 | Discharge: 2021-07-23 | Disposition: A | Discharge status: Home / Self Care | Payer: Medicare Other | Attending: Emergency Medicine | Admitting: Emergency Medicine

## 2021-07-23 DIAGNOSIS — M79671 Pain in right foot: Secondary | ICD-10-CM | POA: Insufficient documentation

## 2021-07-23 DIAGNOSIS — W208XXA Other cause of strike by thrown, projected or falling object, initial encounter: Secondary | ICD-10-CM | POA: Diagnosis not present

## 2021-07-23 MED ORDER — HYDROCODONE-ACETAMINOPHEN 5-325 MG PO TABS: 1.0000 | ORAL_TABLET | Freq: Four times a day (QID) | ORAL | 0 refills | Status: AC | PRN

## 2021-07-23 MED ORDER — MELOXICAM 15 MG PO TABS: 15.0000 mg | ORAL_TABLET | Freq: Every day | ORAL | 0 refills | Status: DC

## 2021-07-23 MED ORDER — OXYCODONE HCL 5 MG PO TABS
5.0000 mg | ORAL_TABLET | Freq: Once | ORAL | Status: AC
  Administered 2021-07-23: 5 mg via ORAL
  Filled 2021-07-23: qty 1

## 2021-07-23 NOTE — Discharge Instructions (Addendum)
Follow up with primary care if not improving over the week. ? ?Return to the ER for symptoms that change or worsen if unable to schedule an appointment. ?

## 2021-07-23 NOTE — ED Triage Notes (Signed)
Pt to ED from home c/o right foot pain, dropped shovel on it a couple days ago and not getting better. ?

## 2021-07-23 NOTE — ED Provider Notes (Signed)
? ?[facility] ?Provider Note ? ? ? Event Date/Time  ? First MD Initiated Contact with Patient 07/23/21 0708   ?  (approximate) ? ? ?History  ? ?Foot Pain ? ? ?HPI ? ?Ann Bowen is a 66 y.o. female  with history of arthritis, fibromyalgia and as listed in EMR presents to the emergency department for treatment and evaluation of right foot pain. She dropped a rake on top of it 2 days ago. She started a new job yesterday and stood for 8 hours and now having severe pain. No relief with tizanidine.  ? ?  ? ?Physical Exam  ? ?Triage Vital Signs: ?ED Triage Vitals  ?Enc Vitals Group  ?   BP 07/23/21 0659 116/65  ?   Pulse Rate 07/23/21 0659 95  ?   Resp 07/23/21 0659 16  ?   Temp 07/23/21 0659 97.8 ?F (36.6 ?C)  ?   Temp Source 07/23/21 0659 Oral  ?   SpO2 07/23/21 0659 98 %  ?   Weight 07/23/21 0658 198 lb 6.6 oz (90 kg)  ?   Height 07/23/21 0658 '5\' 3"'$  (1.6 m)  ?   Head Circumference --   ?   Peak Flow --   ?   Pain Score 07/23/21 0658 10  ?   Pain Loc --   ?   Pain Edu? --   ?   Excl. in [place]? --   ? ? ?Most recent vital signs: ?Vitals:  ? 07/23/21 0659  ?BP: 116/65  ?Pulse: 95  ?Resp: 16  ?Temp: 97.8 ?F (36.6 ?C)  ?SpO2: 98%  ? ? ?General: Awake, no distress.  ?CV:  Good peripheral perfusion.  ?Resp:  Normal effort.  ?Abd:  No distention.  ?Other:  Tenderness over the talar dome without edema, erythema, or wound. ? ? ?ED Results / Procedures / Treatments  ? ?Labs ?(all labs ordered are listed, but only abnormal results are displayed) ?Labs Reviewed - No data to display ? ? ?EKG ? ?Not indicated. ? ? ?RADIOLOGY ? ?Image and radiology report reviewed by me. ? ?No acute abnormality of the right foot. ? ?PROCEDURES: ? ?Critical Care performed: No ? ?Procedures ? ? ?MEDICATIONS ORDERED IN ED: ?Medications  ?oxyCODONE (Oxy IR/ROXICODONE) immediate release tablet 5 mg (5 mg Oral Given 07/23/21 0753)  ? ? ? ?IMPRESSION / MDM / ASSESSMENT AND PLAN / ED COURSE  ? ?I have reviewed the triage  note. ? ?Differential diagnosis includes, but is not limited to, contusion, fracture ? ?66 year old female presents to the ER for evaluation of right foot pain after dropping a rake on it 2 days ago. See HPI. ? ?Image is negative for acute fracture.  Plan will be to place her in a cam walking boot and have her follow-up with podiatry if she is not improving over the week.  Symptoms change or worsen she is to see primary care or return to the emergency department if she is unable to see the specialist. ? ?  ? ? ?FINAL CLINICAL IMPRESSION(S) / ED DIAGNOSES  ? ?Final diagnoses:  ?Acute foot pain, right  ? ? ? ?Rx / DC Orders  ? ?ED Discharge Orders   ? ?      Ordered  ?  meloxicam (MOBIC) 15 MG tablet  Daily       ? 07/23/21 0749  ?  HYDROcodone-acetaminophen (NORCO/VICODIN) 5-325 MG tablet  Every 6 hours PRN       ? 07/23/21 0749  ? ?  ?  ? ?  ? ? ? ?  Note:  This document was prepared using Dragon voice recognition software and may include unintentional dictation errors. ?  ?Victorino Dike, FNP ?07/23/21 1248 ? ?  ?Carrie Mew, MD ?07/25/21 1507 ? ?

## 2021-07-27 NOTE — ED Notes (Signed)
Poison Control contacted by Probation officer due to pt never arriving to ED. Writer spoke with Andee Poles, RN and was informed that Poison control would follow up with pt.  ?

## 2021-07-27 NOTE — ED Triage Notes (Signed)
Call made by [employer], Probation officer spoke with Lattie Haw. Per Lattie Haw, pt took regular nightly medications between 2100-2200 and since has had nausea, anxiousness and visual hallucinations. Pt reported that the only new medication was Meloxicam '15mg'$  that was started last Friday.  ? ?Medications taken tonight:  ? ?Gabapentin '600mg'$  ?Ropinirole '1mg'$  ?Doxepin '30mg'$  ?Tizanidine '4mg'$  ?Meloxicam '15mg'$  ? ?No drug interactions per [facility].  ?

## 2021-08-28 ENCOUNTER — Encounter: Payer: Self-pay | Admitting: Emergency Medicine

## 2021-08-28 ENCOUNTER — Observation Stay
Admit: 2021-08-28 | Discharge: 2021-08-28 | Disposition: A | Discharge status: Home / Self Care | Payer: Medicare Other | Attending: Neurology | Admitting: Neurology

## 2021-08-28 ENCOUNTER — Emergency Department: Payer: Medicare Other

## 2021-08-28 ENCOUNTER — Other Ambulatory Visit: Payer: Self-pay

## 2021-08-28 ENCOUNTER — Observation Stay
Admission: EM | Admit: 2021-08-28 | Discharge: 2021-08-30 | Disposition: A | Discharge status: Home / Self Care | Payer: Medicare Other | Attending: Internal Medicine | Admitting: Internal Medicine

## 2021-08-28 DIAGNOSIS — N179 Acute kidney failure, unspecified: Secondary | ICD-10-CM | POA: Diagnosis not present

## 2021-08-28 DIAGNOSIS — Z79899 Other long term (current) drug therapy: Secondary | ICD-10-CM | POA: Insufficient documentation

## 2021-08-28 DIAGNOSIS — D649 Anemia, unspecified: Secondary | ICD-10-CM

## 2021-08-28 DIAGNOSIS — Z7901 Long term (current) use of anticoagulants: Secondary | ICD-10-CM | POA: Insufficient documentation

## 2021-08-28 DIAGNOSIS — E669 Obesity, unspecified: Secondary | ICD-10-CM | POA: Diagnosis not present

## 2021-08-28 DIAGNOSIS — F418 Other specified anxiety disorders: Secondary | ICD-10-CM | POA: Diagnosis present

## 2021-08-28 DIAGNOSIS — T428X5A Adverse effect of antiparkinsonism drugs and other central muscle-tone depressants, initial encounter: Secondary | ICD-10-CM | POA: Diagnosis not present

## 2021-08-28 DIAGNOSIS — R4189 Other symptoms and signs involving cognitive functions and awareness: Secondary | ICD-10-CM | POA: Diagnosis not present

## 2021-08-28 DIAGNOSIS — D519 Vitamin B12 deficiency anemia, unspecified: Secondary | ICD-10-CM

## 2021-08-28 DIAGNOSIS — R4182 Altered mental status, unspecified: Secondary | ICD-10-CM | POA: Diagnosis present

## 2021-08-28 DIAGNOSIS — G459 Transient cerebral ischemic attack, unspecified: Principal | ICD-10-CM | POA: Diagnosis present

## 2021-08-28 DIAGNOSIS — T50905A Adverse effect of unspecified drugs, medicaments and biological substances, initial encounter: Secondary | ICD-10-CM

## 2021-08-28 DIAGNOSIS — R29898 Other symptoms and signs involving the musculoskeletal system: Secondary | ICD-10-CM | POA: Diagnosis not present

## 2021-08-28 LAB — URINE DRUG SCREEN, QUALITATIVE (ARMC ONLY)
Amphetamines, Ur Screen: NOT DETECTED
Barbiturates, Ur Screen: NOT DETECTED
Benzodiazepine, Ur Scrn: NOT DETECTED
Cannabinoid 50 Ng, Ur ~~LOC~~: NOT DETECTED
Cocaine Metabolite,Ur ~~LOC~~: NOT DETECTED
MDMA (Ecstasy)Ur Screen: NOT DETECTED
Methadone Scn, Ur: NOT DETECTED
Opiate, Ur Screen: NOT DETECTED
Phencyclidine (PCP) Ur S: NOT DETECTED
Tricyclic, Ur Screen: POSITIVE — AB

## 2021-08-28 LAB — COMPREHENSIVE METABOLIC PANEL
ALT: 12 U/L (ref 0–44)
AST: 17 U/L (ref 15–41)
Albumin: 3.4 g/dL — ABNORMAL LOW (ref 3.5–5.0)
Alkaline Phosphatase: 85 U/L (ref 38–126)
Anion gap: 9 (ref 5–15)
BUN: 25 mg/dL — ABNORMAL HIGH (ref 8–23)
CO2: 23 mmol/L (ref 22–32)
Calcium: 8.6 mg/dL — ABNORMAL LOW (ref 8.9–10.3)
Chloride: 104 mmol/L (ref 98–111)
Creatinine, Ser: 1.3 mg/dL — ABNORMAL HIGH (ref 0.44–1.00)
GFR, Estimated: 45 mL/min — ABNORMAL LOW (ref >60)
Glucose, Bld: 119 mg/dL — ABNORMAL HIGH (ref 70–99)
Potassium: 3.7 mmol/L (ref 3.5–5.1)
Sodium: 136 mmol/L (ref 135–145)
Total Bilirubin: 1 mg/dL (ref 0.3–1.2)
Total Protein: 6.9 g/dL (ref 6.5–8.1)

## 2021-08-28 LAB — ECHOCARDIOGRAM COMPLETE BUBBLE STUDY
AR max vel: 1.88 cm2
AV Peak grad: 7.8 mmHg
Ao pk vel: 1.4 m/s
Area-P 1/2: 4.6 cm2
Calc EF: 67.4 %
S' Lateral: 2.48 cm
Single Plane A2C EF: 71 %
Single Plane A4C EF: 66.6 %

## 2021-08-28 LAB — URINALYSIS, ROUTINE W REFLEX MICROSCOPIC
Bilirubin Urine: NEGATIVE
Glucose, UA: NEGATIVE mg/dL
Hgb urine dipstick: NEGATIVE
Ketones, ur: NEGATIVE mg/dL
Nitrite: NEGATIVE
Protein, ur: NEGATIVE mg/dL
Specific Gravity, Urine: 1.012 (ref 1.005–1.030)
pH: 5 (ref 5.0–8.0)

## 2021-08-28 LAB — CBC WITH DIFFERENTIAL/PLATELET
Abs Immature Granulocytes: 0.02 10*3/uL (ref 0.00–0.07)
Basophils Absolute: 0 10*3/uL (ref 0.0–0.1)
Basophils Relative: 0 %
Eosinophils Absolute: 0.2 10*3/uL (ref 0.0–0.5)
Eosinophils Relative: 2 %
HCT: 33.8 % — ABNORMAL LOW (ref 36.0–46.0)
Hemoglobin: 10.9 g/dL — ABNORMAL LOW (ref 12.0–15.0)
Immature Granulocytes: 0 %
Lymphocytes Relative: 18 %
Lymphs Abs: 1.5 10*3/uL (ref 0.7–4.0)
MCH: 27.9 pg (ref 26.0–34.0)
MCHC: 32.2 g/dL (ref 30.0–36.0)
MCV: 86.7 fL (ref 80.0–100.0)
Monocytes Absolute: 0.8 10*3/uL (ref 0.1–1.0)
Monocytes Relative: 10 %
Neutro Abs: 5.7 10*3/uL (ref 1.7–7.7)
Neutrophils Relative %: 70 %
Platelets: 293 10*3/uL (ref 150–400)
RBC: 3.9 MIL/uL (ref 3.87–5.11)
RDW: 13.7 % (ref 11.5–15.5)
WBC: 8.2 10*3/uL (ref 4.0–10.5)
nRBC: 0 % (ref 0.0–0.2)

## 2021-08-28 LAB — CBG MONITORING, ED: Glucose-Capillary: 171 mg/dL — ABNORMAL HIGH (ref 70–99)

## 2021-08-28 LAB — TROPONIN I (HIGH SENSITIVITY): Troponin I (High Sensitivity): 6 ng/L (ref <18)

## 2021-08-28 IMAGING — DX DG CHEST 1V PORT
1 series · 1 of 1 positions shown · non-contrast
Comparison: Chest x-ray [DATE].

CLINICAL DATA: 65-year-old female with history of altered mental
status.

EXAM:
PORTABLE CHEST 1 VIEW

[chest ap]
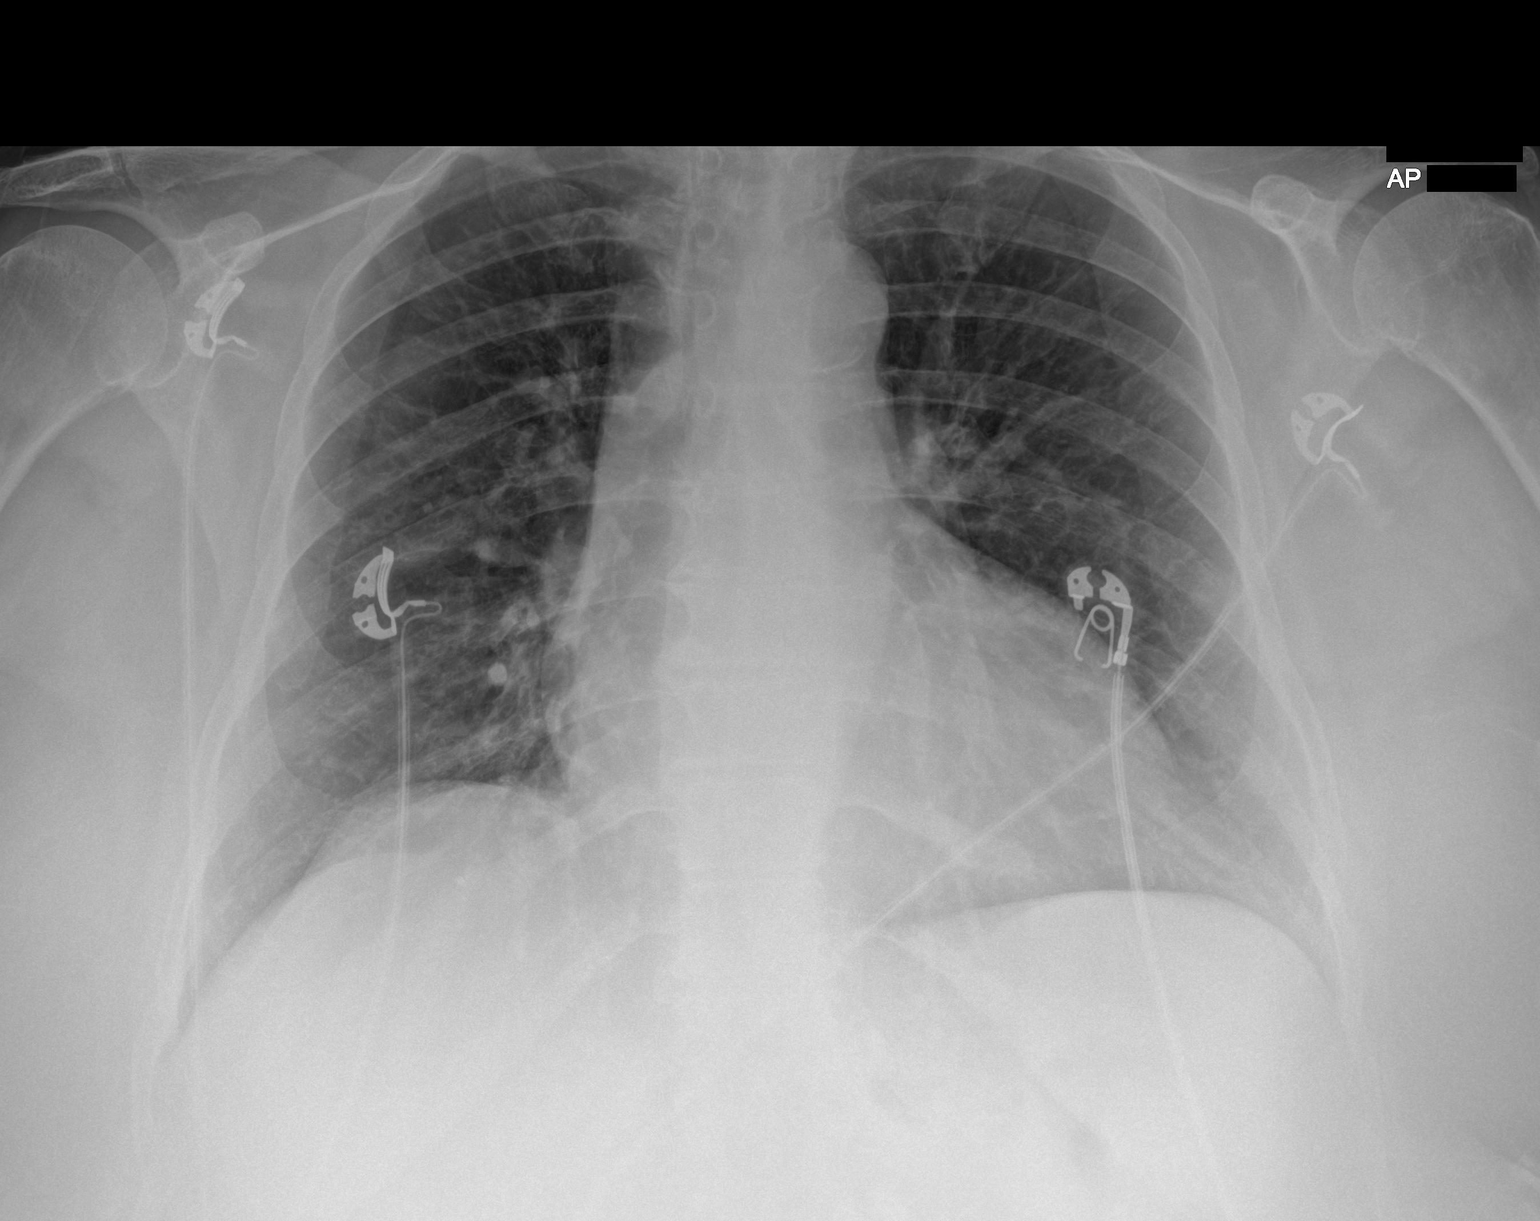

[1 of 1 positions shown; findings below may reference images not displayed]

FINDINGS: Lung volumes are normal. No consolidative airspace disease. No
pleural effusions. No pneumothorax. No pulmonary nodule or mass
noted. Pulmonary vasculature and the cardiomediastinal silhouette
are within normal limits. Atherosclerosis in the thoracic aorta.
IMPRESSION: 1.  No radiographic evidence of acute cardiopulmonary disease.
2. Aortic atherosclerosis.

## 2021-08-28 IMAGING — CT CT HEAD W/O CM
4 series · 17 of 47 positions shown, 19 images · non-contrast
Comparison: Head CT [DATE].

CLINICAL DATA: 65-year-old female with confusion and altered mental
status. TIA.



[Series 2: head wo · axial · 0.44mm/px · z∈[-183,-58]mm · 6 of 35 slices shown, 8 images]
[im 5/35  brain]
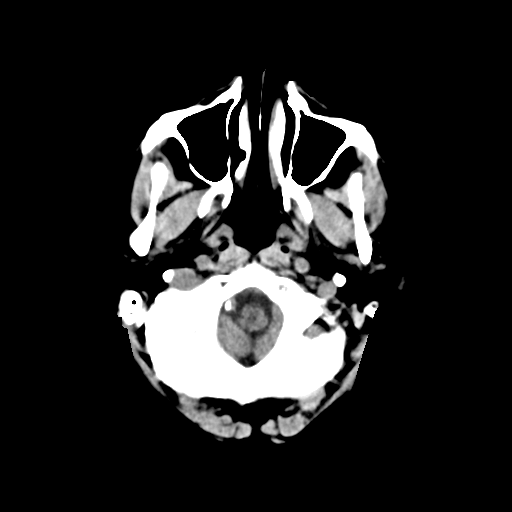
[im 5/35  bone]
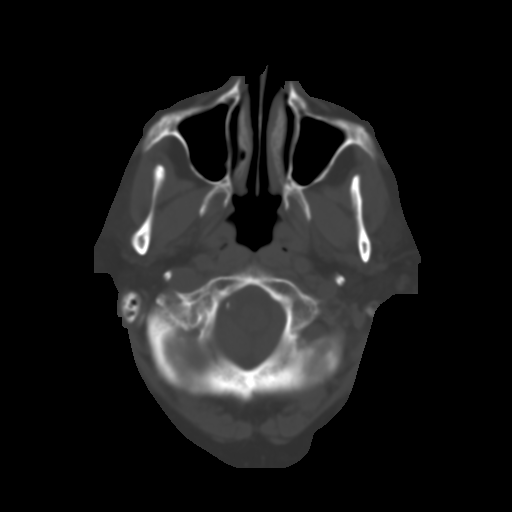
[im 10/35  brain]
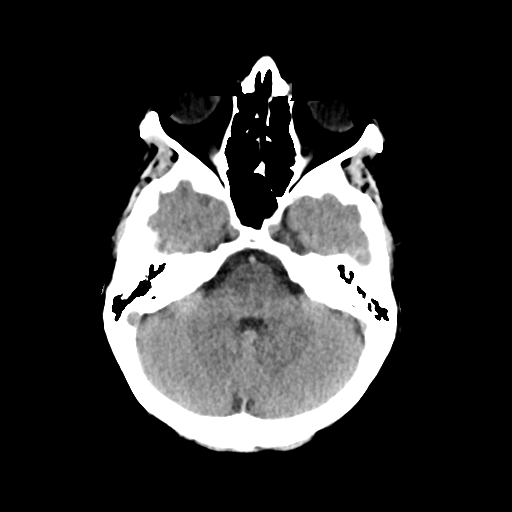
[im 15/35  brain]
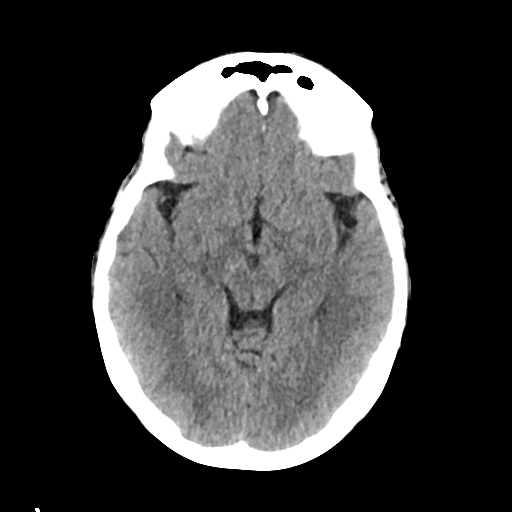
[im 20/35  brain]
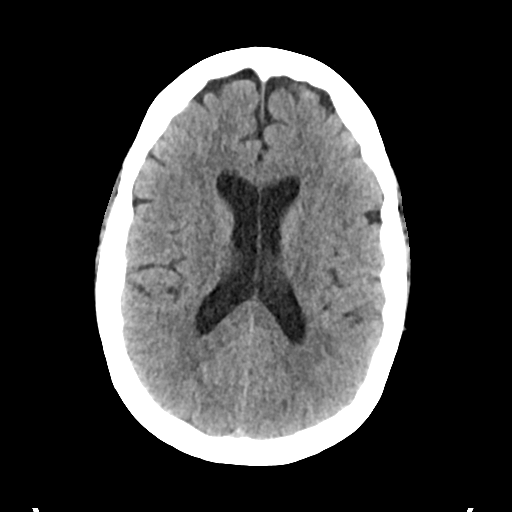
[im 25/35  brain]
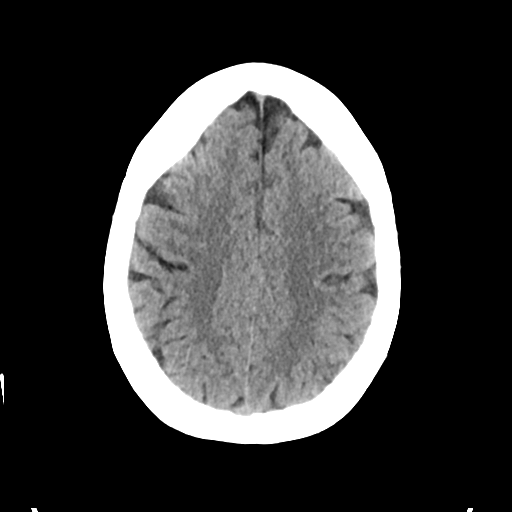
[im 25/35  bone]
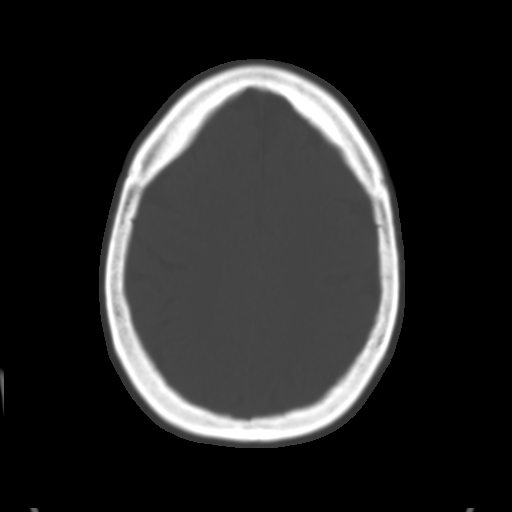
[im 30/35  brain]
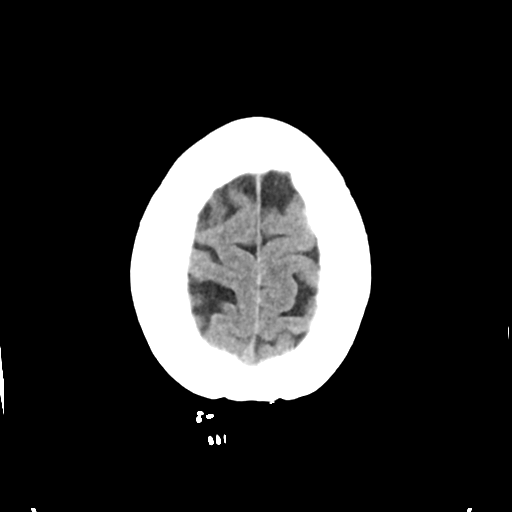

[Series 3: head bone · axial · 0.44mm/px · z∈[-187,-103]mm · 5 of 90 slices shown]
[im 9/90  bone]
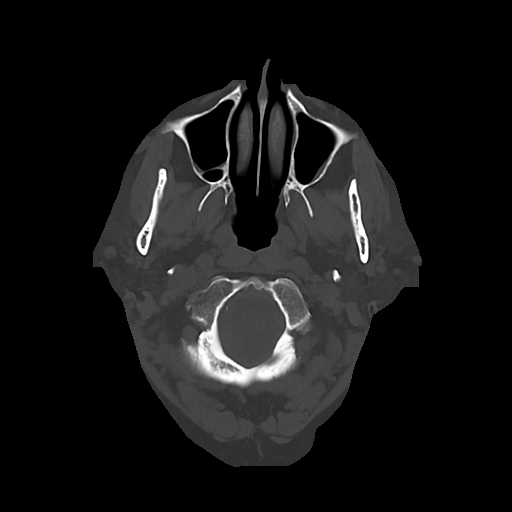
[im 17/90  bone]
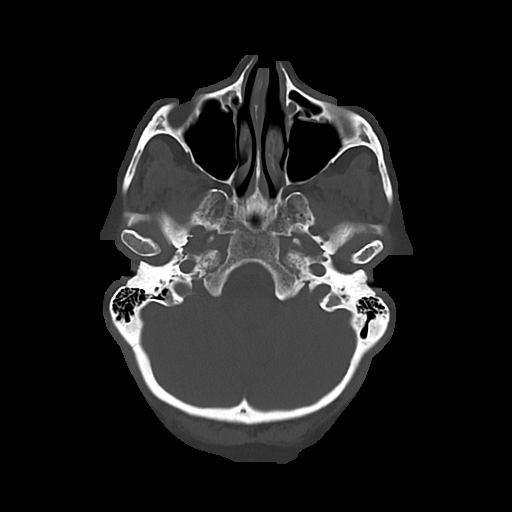
[im 30/90  bone]
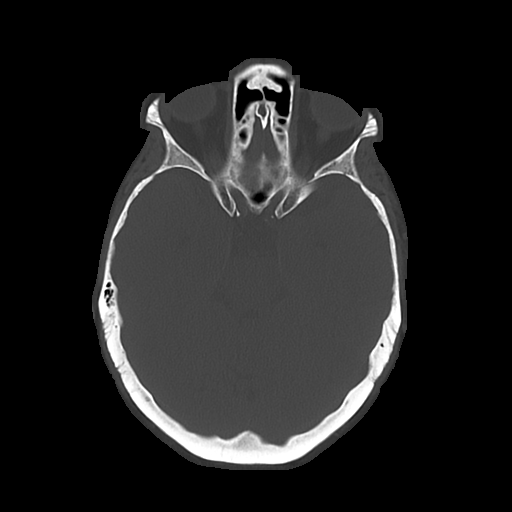
[im 39/90  bone]
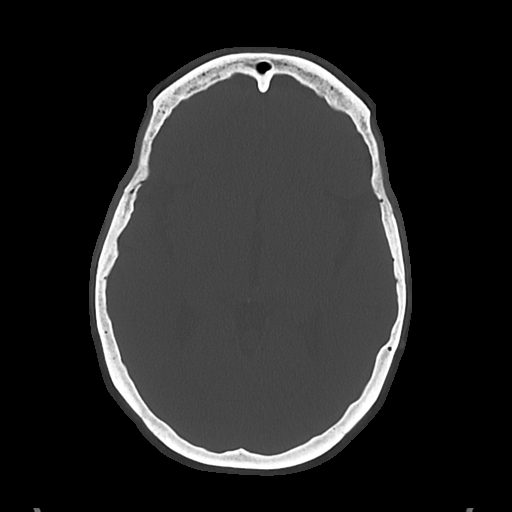
[im 51/90  bone]
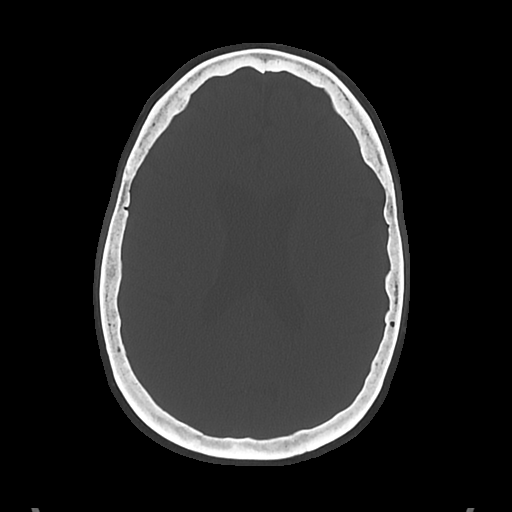

[Series 4: cor soft · coronal · 0.32mm/px · 3 of 68 slices shown]
[im 23/68  brain]
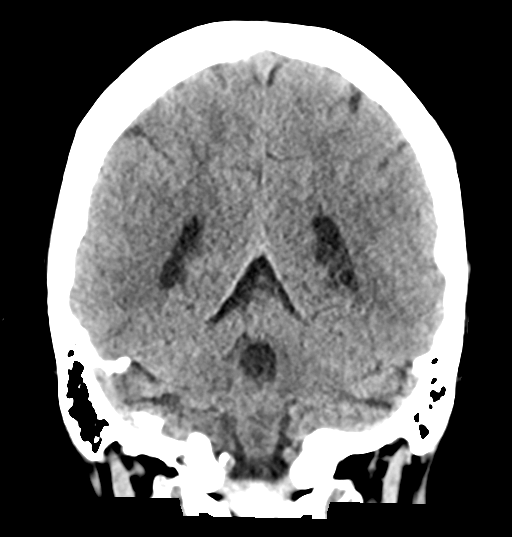
[im 30/68  brain]
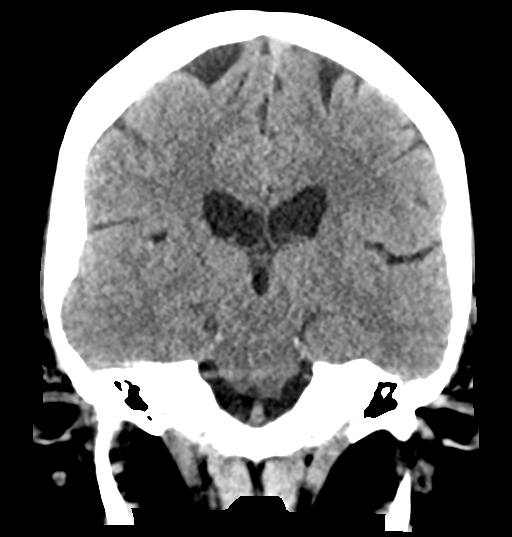
[im 38/68  brain]
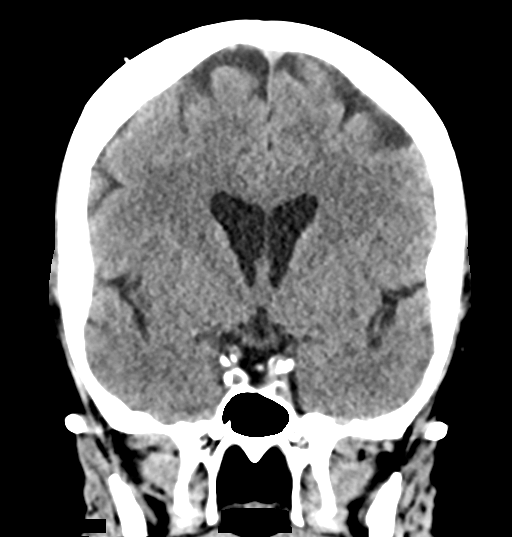

[Series 5: sag soft · sagittal · 0.35mm/px · 3 of 56 slices shown]
[im 19/56  brain]
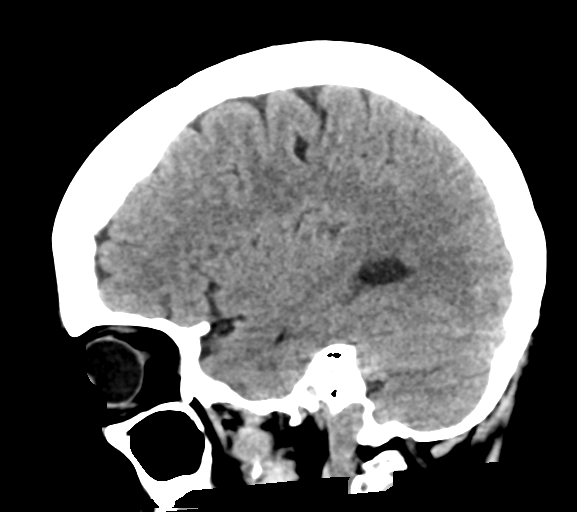
[im 28/56  brain]
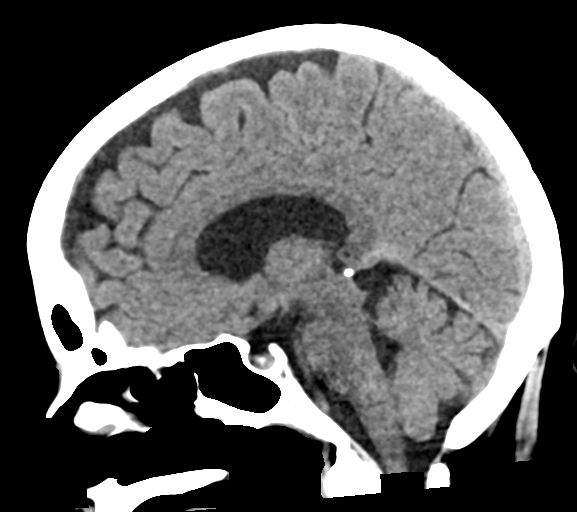
[im 37/56  brain]
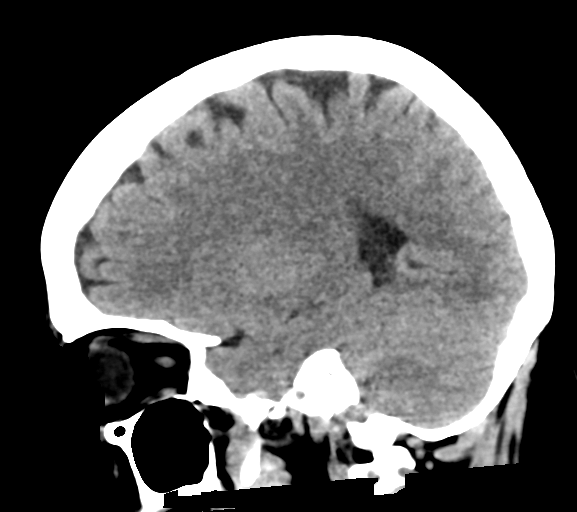

[17 of 47 positions shown; findings below may reference images not displayed]

FINDINGS: Brain: Cerebral volume is stable and within normal limits for age.
No midline shift, ventriculomegaly, mass effect, evidence of mass
lesion, intracranial hemorrhage or evidence of cortically based
acute infarction. Gray-white matter differentiation is within normal
limits throughout the brain.

Vascular: Calcified atherosclerosis at the skull base. No suspicious
intracranial vascular hyperdensity.

Skull: No acute osseous abnormality identified.

Sinuses/Orbits: Visualized paranasal sinuses and mastoids are stable
and well aerated.

Other: Visualized orbits and scalp soft tissues are within normal
limits.
IMPRESSION: Stable and normal for age noncontrast Head CT.

## 2021-08-28 MED ORDER — ACETAMINOPHEN 325 MG RE SUPP
650.0000 mg | RECTAL | Status: DC | PRN
  Filled 2021-08-28: qty 2

## 2021-08-28 MED ORDER — ASPIRIN 81 MG PO CHEW
81.0000 mg | CHEWABLE_TABLET | Freq: Every day | ORAL | Status: DC
  Administered 2021-08-28 – 2021-08-30 (×3): 81 mg via ORAL
  Filled 2021-08-28 (×3): qty 1

## 2021-08-28 MED ORDER — ACETAMINOPHEN 160 MG/5ML PO SOLN
650.0000 mg | ORAL | Status: DC | PRN
  Filled 2021-08-28: qty 20.3

## 2021-08-28 MED ORDER — STROKE: EARLY STAGES OF RECOVERY BOOK
Freq: Once | Status: AC
  Administered 2021-08-28: 1

## 2021-08-28 MED ORDER — ENOXAPARIN SODIUM 60 MG/0.6ML IJ SOSY
0.5000 mg/kg | PREFILLED_SYRINGE | INTRAMUSCULAR | Status: DC
  Administered 2021-08-28 – 2021-08-29 (×2): 47.5 mg via SUBCUTANEOUS
  Filled 2021-08-28 (×2): qty 0.6

## 2021-08-28 MED ORDER — LAMOTRIGINE 100 MG PO TABS
50.0000 mg | ORAL_TABLET | Freq: Every day | ORAL | Status: DC
  Filled 2021-08-28: qty 1

## 2021-08-28 MED ORDER — ROPINIROLE HCL 1 MG PO TABS
1.0000 mg | ORAL_TABLET | Freq: Two times a day (BID) | ORAL | Status: DC
  Administered 2021-08-28 – 2021-08-30 (×4): 1 mg via ORAL
  Filled 2021-08-28 (×4): qty 1

## 2021-08-28 MED ORDER — GABAPENTIN 300 MG PO CAPS
300.0000 mg | ORAL_CAPSULE | Freq: Two times a day (BID) | ORAL | Status: DC
Start: 2021-08-28 — End: 2021-08-30
  Administered 2021-08-28 – 2021-08-30 (×4): 300 mg via ORAL
  Filled 2021-08-28 (×4): qty 1

## 2021-08-28 MED ORDER — ALBUTEROL SULFATE (2.5 MG/3ML) 0.083% IN NEBU: 2.5000 mg | INHALATION_SOLUTION | RESPIRATORY_TRACT | Status: DC | PRN

## 2021-08-28 MED ORDER — SODIUM CHLORIDE 0.9 % IV SOLN: INTRAVENOUS | Status: AC

## 2021-08-28 MED ORDER — TIZANIDINE HCL 2 MG PO TABS
4.0000 mg | ORAL_TABLET | Freq: Every day | ORAL | Status: DC
  Administered 2021-08-28 – 2021-08-29 (×2): 4 mg via ORAL
  Filled 2021-08-28 (×2): qty 2

## 2021-08-28 MED ORDER — ATORVASTATIN CALCIUM 20 MG PO TABS
40.0000 mg | ORAL_TABLET | Freq: Every day | ORAL | Status: DC
  Administered 2021-08-28 – 2021-08-30 (×3): 40 mg via ORAL
  Filled 2021-08-28 (×3): qty 2

## 2021-08-28 MED ORDER — ACETAMINOPHEN 325 MG PO TABS: 650.0000 mg | ORAL_TABLET | ORAL | Status: DC | PRN

## 2021-08-28 MED ORDER — PNEUMOCOCCAL 20-VAL CONJ VACC 0.5 ML IM SUSY
0.5000 mL | PREFILLED_SYRINGE | INTRAMUSCULAR | Status: DC
  Filled 2021-08-28: qty 0.5

## 2021-08-28 MED ORDER — PHENTERMINE HCL 30 MG PO CAPS: 30.0000 mg | ORAL_CAPSULE | ORAL | Status: DC

## 2021-08-28 MED ORDER — DOXEPIN HCL 10 MG PO CAPS
20.0000 mg | ORAL_CAPSULE | Freq: Every day | ORAL | Status: DC
  Administered 2021-08-28 – 2021-08-29 (×2): 20 mg via ORAL
  Filled 2021-08-28 (×2): qty 2

## 2021-08-28 NOTE — Assessment & Plan Note (Addendum)
Possibility of TIA but more likely drug reaction.  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  Patient seen by neurology.  Stroke work-up is negative.  Continue aspirin.  No indication for statin since LDL at goal. ?

## 2021-08-28 NOTE — Progress Notes (Signed)
OT Cancellation Note ? ?Patient Details ?Name: Ann Bowen ?MRN: [id number] ?DOB: 1956/01/07 ? ? ?Cancelled Treatment:    Reason Eval/Treat Not Completed: Other (comment);OT screened, no needs identified, will sign off (per chart review and discussion with PT, pt appears to be performing at or close to baseline. No OT needs identified at this time. OT will complete order. Please reconsult if there is a change in functional status.) ? ?Shanon Payor, OTD OTR/L  ?08/28/21, 1:40 PM  ?

## 2021-08-28 NOTE — Progress Notes (Signed)
?  Chaplain On-Call responded to Spiritual Care Consult Order from RN Posey Rea. ?Patient requested information about Advance Directives. ? ?Chaplain met the patient and provided the AD documents and education to the patient. ? ?Patient stated her understanding, and plans to discuss further with her family. ? ?Chaplain Pollyann Samples ?M.Div., [facility]  ?

## 2021-08-28 NOTE — Progress Notes (Signed)
PHARMACIST - PHYSICIAN COMMUNICATION ? ?CONCERNING:  Enoxaparin (Lovenox) for DVT Prophylaxis  ? ?DESCRIPTION: ?Patient was prescribed enoxaprin '40mg'$  q24 hours for VTE prophylaxis.  ? Danley Danker Weights  ? 08/28/21 0703  ?Weight: 95.3 kg (210 lb)  ? ? ?Body mass index is 37.2 kg/m?. ? ?Estimated Creatinine Clearance: 46.8 mL/min (A) (by C-G formula based on SCr of 1.3 mg/dL (H)). ? ? ?Based on [place] patient is candidate for enoxaparin 0.'5mg'$ /kg TBW SQ every 24 hours based on BMI being >30. ? ?RECOMMENDATION: ?Pharmacy has adjusted enoxaparin dose per [facility] policy. ? ?Patient is now receiving enoxaparin 47.5 mg every 24 hours  ? ? ?Darnelle Bos, PharmD ?Clinical Pharmacist  ?08/28/2021 ?1:08 PM ? ?

## 2021-08-28 NOTE — Progress Notes (Signed)
*  PRELIMINARY RESULTS* ?Echocardiogram ?2D Echocardiogram has been performed. ? ?Ann Bowen ?08/28/2021, 3:39 PM ?

## 2021-08-28 NOTE — Assessment & Plan Note (Addendum)
BMI 37.20  ?

## 2021-08-28 NOTE — Evaluation (Signed)
Physical Therapy Evaluation ?Patient Details ?Name: Eyleen Rawlinson ?MRN: [id number] ?DOB: 06-Mar-1956 ?Today's Date: 08/28/2021 ? ?History of Present Illness ? Patient is a 66 year old female who reported to [facility] with stroke like symptoms. Patient has a PMH(+) for  arthritis, fibromyalgia ?  ?Clinical Impression ? Physical Therapy Evaluation completed this session. Patient tolerated session well and was agreeable to treatment. No pain reported throughout session. Upon entry patient was sitting EOB with speech therapy, RN, and NT present. Patient states she lives in a 1 story home with her husband, was Independent with all ALDs and mobility prior to hospitalization, and has 2STE. Patient works as a Camera operator, and does not use any AD at baseline. Patient demonstrated [facility] strength in BUE and BLE, and independent ability to perform steps, ambulation, bed mobility, and functional transfers. With varying gait speeds and alternating head movements patient was able to maintain good balance, as well as pick up objects from the floor independently. Patient is demonstrating at/near baseline level of function, and does not require additional skilled physical therapy. Signing off.  ?   ? ?Recommendations for follow up therapy are one component of a multi-disciplinary discharge planning process, led by the attending physician.  Recommendations may be updated based on patient status, additional functional criteria and insurance authorization. ? ?Follow Up Recommendations No PT follow up ? ?  ?Assistance Recommended at Discharge None  ?Patient can return home with the following ?   ? ?  ?Equipment Recommendations None recommended by PT  ?Recommendations for Other Services ?    ?  ?Functional Status Assessment Patient has not had a recent decline in their functional status  ? ?  ?Precautions / Restrictions Precautions ?Precautions: Fall ?Restrictions ?Weight Bearing Restrictions: No  ? ?  ? ?Mobility ? Bed Mobility ?Overal bed  mobility: Independent ?  ?  ?  ?  ?  ?  ?  ?  ? ?Transfers ?Overall transfer level: Independent ?Equipment used: None ?  ?  ?  ?  ?  ?  ?  ?  ?  ? ?Ambulation/Gait ?Ambulation/Gait assistance: Independent ?Gait Distance (Feet): 200 Feet ?Assistive device: None ?Gait Pattern/deviations: WFL(Within Functional Limits) ?  ?Gait velocity interpretation: >4.37 ft/sec, indicative of normal walking speed ?  ?General Gait Details: patient demonstrated good balance with varying gait speeds and alternating head movements ? ?Stairs ?Stairs: Yes ?Stairs assistance: Modified independent (Device/Increase time) ?Stair Management: One rail Right, Alternating pattern ?Number of Stairs: 5 ?General stair comments: no LOB noted ? ?Wheelchair Mobility ?  ? ?Modified Rankin (Stroke Patients Only) ?  ? ?  ? ?Balance Overall balance assessment: Independent ?  ?  ?  ?  ?  ?  ?  ?  ?  ?  ?  ?  ?  ?  ?  ?  ?  ?  ?   ? ? ? ?Pertinent Vitals/Pain Pain Assessment ?Pain Assessment: No/denies pain  ? ? ?Home Living Family/patient expects to be discharged to:: Private residence ?Living Arrangements: Spouse/significant other ?Available Help at Discharge: Family ?Type of Home: House ?Home Access: Stairs to enter ?Entrance Stairs-Rails: Right;Left;Can reach both ?Entrance Stairs-Number of Steps: 2 ?  ?Home Layout: One level ?Home Equipment: None ?   ?  ?Prior Function Prior Level of Function : Independent/Modified Independent ?  ?  ?  ?  ?  ?  ?  ?  ?  ? ? ?Hand Dominance  ? Dominant Hand: Right ? ?  ?Extremity/Trunk Assessment  ?  Upper Extremity Assessment ?Upper Extremity Assessment: Overall WFL for tasks assessed ?  ? ?Lower Extremity Assessment ?Lower Extremity Assessment: Overall WFL for tasks assessed ?  ? ?   ?Communication  ? Communication: No difficulties  ?Cognition Arousal/Alertness: Awake/alert ?Behavior During Therapy: [facility] for tasks assessed/performed ?Overall Cognitive Status: Within Functional Limits for tasks assessed ?  ?  ?  ?  ?  ?   ?  ?  ?  ?  ?  ?  ?  ?  ?  ?  ?General Comments: A&Ox3 ?  ?  ? ?  ?General Comments General comments (skin integrity, edema, etc.): HR ranged from 80-92bpm, SpO2 remained >90% ? ?  ?Exercises Other Exercises ?Other Exercises: Patient educated on role of PT in acute setting, fall risk, and d/c recommendations  ? ?Assessment/Plan  ?  ?PT Assessment Patient does not need any further PT services  ?PT Problem List   ? ?   ?  ?PT Treatment Interventions     ? ?PT Goals (Current goals can be found in the Care Plan section)  ?Acute Rehab PT Goals ?Patient Stated Goal: to go home ?PT Goal Formulation: With patient ?Time For Goal Achievement: 09/11/21 ?Potential to Achieve Goals: Good ? ?  ?Frequency   ?  ? ? ?Co-evaluation   ?  ?  ?  ?  ? ? ?  ?AM-PAC PT "6 Clicks" Mobility  ?Outcome Measure Help needed turning from your back to your side while in a flat bed without using bedrails?: None ?Help needed moving from lying on your back to sitting on the side of a flat bed without using bedrails?: None ?Help needed moving to and from a bed to a chair (including a wheelchair)?: None ?Help needed standing up from a chair using your arms (e.g., wheelchair or bedside chair)?: None ?Help needed to walk in hospital room?: None ?Help needed climbing 3-5 steps with a railing? : None ?6 Click Score: 24 ? ?  ?End of Session Equipment Utilized During Treatment: Gait belt ?Activity Tolerance: Patient tolerated treatment well ?Patient left: in bed;with call bell/phone within reach ?Nurse Communication: Mobility status ?PT Visit Diagnosis: Other symptoms and signs involving the nervous system (R29.898) ?  ? ?Time: 6754-4920 ?PT Time Calculation (min) (ACUTE ONLY): 13 min ? ? ?Charges:   PT Evaluation ?$PT Eval Low Complexity: 1 Low ?  ?  ?   ? ? ?Iva Boop, PT  ?08/28/21. 1:32 PM ? ? ?

## 2021-08-28 NOTE — ED Notes (Signed)
Elsie Amis RN aware of assigned bed ?

## 2021-08-28 NOTE — Assessment & Plan Note (Addendum)
Acute kidney injury.  This has resolved.  Creatinine 1.3 on presentation down to 0.79. ?

## 2021-08-28 NOTE — Assessment & Plan Note (Signed)
Continue doxepin ?

## 2021-08-28 NOTE — ED Triage Notes (Signed)
Pt reports she thinks shes having mini strokes. States confusion when she woke up this morning and couldn't figure out how to use phone. Denies any weakness to certain side. C/o bilateral foot pain. States a similar episode happened a few weeks ago.  ?

## 2021-08-28 NOTE — ED Provider Notes (Signed)
? ?[facility] ?Provider Note ? ? ? Event Date/Time  ? First MD Initiated Contact with Patient 08/28/21 [phone]   ?  (approximate) ? ? ?History  ? ?Altered Mental Status ? ? ?HPI ? ?Ann Bowen is a 66 y.o. female who denies any past medical problems who reports this morning and 1 time several days ago she woke up from sleeping and did not know where she was and could not figure out how to use her cell phone.  The symptoms have now resolved.  She did not have any weakness or numbness or slurry speech or trouble seeing.  She says she is getting over pneumonia and was coughing a lot of stuff up yesterday but is not today.  Patient also complains of intermittent episodes of burning in the soles of the feet last couple days and go away. ? ?  ? ? ?Physical Exam  ? ?Triage Vital Signs: ?ED Triage Vitals  ?Enc Vitals Group  ?   BP 08/28/21 0702 103/64  ?   Pulse Rate 08/28/21 0702 71  ?   Resp 08/28/21 0702 20  ?   Temp 08/28/21 0705 97.9 ?F (36.6 ?C)  ?   Temp Source 08/28/21 0705 Oral  ?   SpO2 08/28/21 0702 98 %  ?   Weight 08/28/21 0703 210 lb (95.3 kg)  ?   Height 08/28/21 0703 '5\' 3"'$  (1.6 m)  ?   Head Circumference --   ?   Peak Flow --   ?   Pain Score 08/28/21 0703 0  ?   Pain Loc --   ?   Pain Edu? --   ?   Excl. in [place]? --   ? ? ?Most recent vital signs: ?Vitals:  ? 08/28/21 1215 08/28/21 1302  ?BP:  (!) 156/67  ?Pulse: 70 77  ?Resp: 17 18  ?Temp:  98.1 ?F (36.7 ?C)  ?SpO2: 98% 100%  ? ? ? ?General: Awake, no distress.  ?Head normocephalic atraumatic ?Eyes pupils equal round reactive extraocular movements intact ?Mouth no erythema or exudate ?CV:  Good peripheral perfusion.  Heart regular rate and rhythm no audible murmur ?Resp:  Normal effort.  Lungs are clear ?Abd:  No distention.  Abdomen soft bowel sounds are positive there is no tenderness ?Extremities no edema no foot pain currently ?Neuro exam cranial nerves II through XII are intact although visual fields were not checked cerebellar  finger-nose rapid alternating movements and hands are normal motor strength is 5/5 throughout and patient does not report any numbness ? ?ED Results / Procedures / Treatments  ? ?Labs ?(all labs ordered are listed, but only abnormal results are displayed) ?Labs Reviewed  ?COMPREHENSIVE METABOLIC PANEL - Abnormal; Notable for the following components:  ?    Result Value  ? Glucose, Bld 119 (*)   ? BUN 25 (*)   ? Creatinine, Ser 1.30 (*)   ? Calcium 8.6 (*)   ? Albumin 3.4 (*)   ? GFR, Estimated 45 (*)   ? All other components within normal limits  ?CBC WITH DIFFERENTIAL/PLATELET - Abnormal; Notable for the following components:  ? Hemoglobin 10.9 (*)   ? HCT 33.8 (*)   ? All other components within normal limits  ?URINALYSIS, ROUTINE W REFLEX MICROSCOPIC - Abnormal; Notable for the following components:  ? Color, Urine YELLOW (*)   ? APPearance CLEAR (*)   ? Leukocytes,Ua SMALL (*)   ? Bacteria, UA RARE (*)   ? All other components  within normal limits  ?URINE DRUG SCREEN, QUALITATIVE ([facility] ONLY) - Abnormal; Notable for the following components:  ? Tricyclic, Ur Screen POSITIVE (*)   ? All other components within normal limits  ?CBG MONITORING, ED - Abnormal; Notable for the following components:  ? Glucose-Capillary 171 (*)   ? All other components within normal limits  ?LDL CHOLESTEROL, DIRECT  ?HEMOGLOBIN A1C  ?TROPONIN I (HIGH SENSITIVITY)  ? ? ? ?EKG ? ?EKG read and interpreted by me shows normal sinus rhythm rate of 66 there are some PR segment depression in lead II otherwise no acute disease. ? ? ?RADIOLOGY ?Chest x-ray reviewed by me may show a little bit of increased markings in the right middle lobe area around the fissure the minor fissure waiting for the radiologist to read the report ? ? ?PROCEDURES: ? ?Critical Care performed:  ? ?Procedures ? ? ?MEDICATIONS ORDERED IN ED: ?Medications  ?doxepin (SINEQUAN) capsule 20 mg (has no administration in time range)  ?gabapentin (NEURONTIN) capsule 300 mg (has  no administration in time range)  ?rOPINIRole (REQUIP) tablet 1 mg (has no administration in time range)  ?lamoTRIgine (LAMICTAL) tablet 50 mg (50 mg Oral Patient Refused/Not Given 08/28/21 1500)  ?tiZANidine (ZANAFLEX) tablet 4 mg (has no administration in time range)  ?albuterol (PROVENTIL) (2.5 MG/3ML) 0.083% nebulizer solution 2.5 mg (has no administration in time range)  ? stroke: early stages of recovery book (has no administration in time range)  ?acetaminophen (TYLENOL) tablet 650 mg (has no administration in time range)  ?  Or  ?acetaminophen (TYLENOL) 160 MG/5ML solution 650 mg (has no administration in time range)  ?  Or  ?acetaminophen (TYLENOL) suppository 650 mg (has no administration in time range)  ?enoxaparin (LOVENOX) injection 47.5 mg (has no administration in time range)  ?aspirin chewable tablet 81 mg (81 mg Oral Given 08/28/21 1500)  ?atorvastatin (LIPITOR) tablet 40 mg (40 mg Oral Given 08/28/21 1500)  ?pneumococcal 20-valent conjugate vaccine (PREVNAR 20) injection 0.5 mL (has no administration in time range)  ? ? ? ?IMPRESSION / MDM / ASSESSMENT AND PLAN / ED COURSE  ?I reviewed the triage vital signs and the nursing notes. ?Gust patient with Dr. Quinn Axe neurology.  She feels this is likely TIA as I do.  We will get the patient admitted to the hospital. ?I then discussed the patient with the hospitalist who will admit the patient.  We will complete a stroke work-up. ? ? ?The patient is on the cardiac monitor to evaluate for evidence of arrhythmia and/or significant heart rate changes.  None have been seen. ? ? ? ?FINAL CLINICAL IMPRESSION(S) / ED DIAGNOSES  ? ?Final diagnoses:  ?TIA (transient ischemic attack)  ? ? ? ?Rx / DC Orders  ? ?ED Discharge Orders   ? ? None  ? ?  ? ? ? ?Note:  This document was prepared using Dragon voice recognition software and may include unintentional dictation errors. ?  ?Nena Polio, MD ?08/28/21 1536 ? ?

## 2021-08-28 NOTE — ED Notes (Signed)
Dr. Francine Graven states no need for repeat trop, order d/c'd. ?

## 2021-08-28 NOTE — Progress Notes (Signed)
SLP Cancellation Note ? ?Patient Details ?Name: Ann Bowen ?MRN: [id number] ?DOB: December 01, 1955 ? ? ?Cancelled treatment:       Reason Eval/Treat Not Completed: SLP screened, no needs identified, will sign off (chart reviewed; consulted NSG then met w/ pt in room as she was being admitted to the room) ?Pt denied any difficulty w/ her speech and language abilities. She conversed in conversation w/ multiple staff members w/out expressive/receptive deficits noted; pt denied any confusion. Speech fully intelligible and clear; she feels slight impact on her articulation when wearing her Partial plate piece(noted a missing front tooth).  ?NSG is performing her [facility] swallow screen and will f/u w/ MD to order the diet after. ?No further skilled ST services indicated as pt appears at her baseline. Pt agreed. NSG to reconsult if any change in status while admitted.   ? ? ? ? ?Orinda Kenner, MS, CCC-SLP ?Speech Language Pathologist ?[facility]; [place] ?[phone] (ascom) ?Jovaughn Wojtaszek ?08/28/2021, 1:07 PM ?

## 2021-08-28 NOTE — ED Notes (Signed)
Pt presents to ED with c/o of having intermittent AMS. Pt states this morning she woke up and didn't know where she was, pt also states husband brought her here but she didn't know why she was here. Pt states she is unsure if this has ever happened. Pt is now currently A&Ox4 with no complaints of pain or headache. Pt denies blood thinner use, pt denies any injury or trauma to head. Pt denies any dysuria or urinary symptoms, pt states HX of UTI "a long time ago" Husband at bedside and cannot recall any other episodes similar to this.  ?

## 2021-08-28 NOTE — H&P (Addendum)
?History and Physical  ? ? ?Patient: Ann Bowen LTJ:[id number] DOB: [birth year] ?DOA: 08/28/2021 ?DOS: the patient was seen and examined on 08/28/2021 ?[facility]  ?Patient coming from: Home ? ?Chief Complaint:  ?Chief Complaint  ?Patient presents with  ? Altered Mental Status  ? ?HPI: Ann Bowen is a 66 y.o. female with medical history significant for depression, fibromyalgia, borderline personality disorder who presents to the ER via private vehicle for evaluation of confusion and disorientation this morning. ?Patient was in her usual state of health and went to bed at about 2:30 AM on the morning of presentation.  She woke up at about 5:30 AM and according to her husband at the bedside was very disoriented and did not know where she was or how she got there.  Due to persistence of her symptoms he decided to bring her to the ER to get checked out when they arrived at the hospital she refused to get down from the vehicle.  He started driving home but about 15 minutes into the drive patient's mental status improved and she agreed with coming to the ER for evaluation. ?She has no focal deficits, denies having any headache, no difficulty swallowing, no blurred vision, no dizziness or lightheadedness. ?She states that she has had episodes of disorientation in the past but none lasting this long. ?She denies having any fever, no chills, no cough, no urinary symptoms, no changes in her bowel habits, no abdominal pain, no leg swelling, no blurred vision or focal deficit. ?Review of Systems: As mentioned in the history of present illness. All other systems reviewed and are negative. ?Past Medical History:  ?Diagnosis Date  ? Arthritis   ? Borderline personality disorder ([place])   ? Depression   ? Fibromyalgia   ? Shingles   ? ?Past Surgical History:  ?Procedure Laterality Date  ? CHOLECYSTECTOMY    ? COLONOSCOPY N/A 01/14/2021  ? Procedure: COLONOSCOPY;  Surgeon: [name], MD;  Location: [place];  Service: Endoscopy;  Laterality: N/A;  positive home test 12-11-20  ? COLONOSCOPY WITH PROPOFOL N/A 01/01/2018  ? Procedure: COLONOSCOPY WITH PROPOFOL;  Surgeon: Lin Landsman, MD;  Location[facility] ENDOSCOPY;  Service: Gastroenterology;  Laterality: N/A;  ? GASTRIC BYPASS    ? SHOULDER SURGERY    ? ?Social History:  reports that she has never smoked. She has never used smokeless tobacco. She reports current alcohol use. She reports that she does not use drugs. ? ?Allergies  ?Allergen Reactions  ? Wellbutrin [Bupropion] Hives and Rash  ? ? ?Family History  ?Problem Relation Age of Onset  ? Diabetes Mellitus II Mother   ? Diabetes Mellitus II Father   ? Hypertension Father   ? Diabetes Mellitus II Sister   ? Breast cancer Neg Hx   ? ? ?Prior to Admission medications   ?Medication Sig Start Date End Date Taking? Authorizing Provider  ?clonazePAM (KLONOPIN) 0.5 MG tablet Take 1-2 tablets by mouth 3 (three) times daily as needed. 12/07/20  Yes [provider]  ?doxepin (SINEQUAN) 10 MG capsule 10 MG capsule Take 20 mg by mouth nightly 09/18/19  Yes [provider]  ?gabapentin (NEURONTIN) 300 MG capsule Take by mouth. 08/27/21  Yes [provider]  ?indomethacin (INDOCIN) 50 MG capsule Take 50 mg by mouth 3 (three) times daily as needed. 08/27/21  Yes [provider]  ?lamoTRIgine (LAMICTAL) 25 MG tablet Take 50 mg by mouth daily. 04/02/21  Yes [provider]  ?losartan (  COZAAR) 25 MG tablet Take 25 mg by mouth daily. 12/02/20  Yes [provider]  ?meloxicam (MOBIC) 15 MG tablet Take 1 tablet (15 mg total) by mouth daily. 07/23/21  Yes Triplett, Dessa Phi, FNP  ?phentermine 30 MG capsule Take 30 mg by mouth every morning.   Yes [provider]  ?PROVENTIL HFA 108 (90 Base) MCG/ACT inhaler Inhale 2 puffs into the lungs every 4 (four) hours as needed. 08/09/21  Yes [provider]  ?rOPINIRole (REQUIP) 1 MG tablet Take 1 mg by mouth in the  morning and at bedtime.    Yes [provider]  ?tiZANidine (ZANAFLEX) 4 MG tablet Take 4 mg by mouth at bedtime. 12/01/20  Yes [provider]  ?apixaban (ELIQUIS) 5 MG TABS tablet Take 1 tablet (5 mg total) by mouth 2 (two) times daily. ?Patient not taking: Reported on 08/28/2021 12/14/20   Sharen Hones, MD  ?betamethasone dipropionate (DIPROLENE) 0.05 % ointment Apply topically 2 (two) times daily as needed (Rash). Avoid face, groin, underarms. ?Patient not taking: Reported on 01/14/2021 11/19/20   Brendolyn Patty, MD  ?bimatoprost (LATISSE) 0.03 % ophthalmic solution Place 1 application into both eyes at bedtime. Place one drop on applicator and apply evenly along the skin of the upper eyelid at base of eyelashes once daily at bedtime; repeat procedure for second eye (use a clean applicator). ?Patient not taking: Reported on 01/14/2021 11/19/20   Brendolyn Patty, MD  ?clonazePAM (KLONOPIN) 1 MG tablet Take 1 mg by mouth at bedtime as needed. ?Patient not taking: Reported on 08/28/2021 05/06/21   [provider]  ?diclofenac (VOLTAREN) 75 MG EC tablet Take 75 mg by mouth 2 (two) times daily. ?Patient not taking: Reported on 01/14/2021    [provider]  ?DIPROLENE 0.05 % ointment Apply 1 application topically 2 (two) times daily. ?Patient not taking: Reported on 01/14/2021 11/20/20   [provider]  ?EPINEPHrine 0.3 mg/0.3 mL IJ SOAJ injection Inject into the muscle.    [provider]  ?metroNIDAZOLE (METROCREAM) 0.75 % cream Apply topically 2 (two) times daily. ?Patient not taking: Reported on 01/14/2021 11/19/20 11/19/21  Brendolyn Patty, MD  ? ? ?Physical Exam: ?Vitals:  ? 08/28/21 1145 08/28/21 1200 08/28/21 1215 08/28/21 1302  ?BP:  112/62  (!) 156/67  ?Pulse: 64 71 70 77  ?Resp: '15 18 17 18  '$ ?Temp:    98.1 ?F (36.7 ?C)  ?TempSrc:      ?SpO2: 98% 99% 98% 100%  ?Weight:      ?Height:      ? ?Physical Exam ?Vitals and nursing note reviewed.  ?Constitutional:   ?   Appearance: Normal  appearance.  ?HENT:  ?   Head: Normocephalic and atraumatic.  ?   Nose: Nose normal.  ?   Mouth/Throat:  ?   Mouth: Mucous membranes are moist.  ?Eyes:  ?   Pupils: Pupils are equal, round, and reactive to light.  ?Cardiovascular:  ?   Rate and Rhythm: Normal rate and regular rhythm.  ?Pulmonary:  ?   Effort: Pulmonary effort is normal.  ?   Breath sounds: Normal breath sounds.  ?Abdominal:  ?   General: Abdomen is flat. Bowel sounds are normal.  ?   Palpations: Abdomen is soft.  ?Musculoskeletal:     ?   General: Normal range of motion.  ?   Cervical back: Normal range of motion and neck supple.  ?Skin: ?   General: Skin is warm and dry.  ?Neurological:  ?  General: No focal deficit present.  ?   Mental Status: She is alert and oriented to person, place, and time.  ?Psychiatric:     ?   Mood and Affect: Mood normal.     ?   Behavior: Behavior normal.  ? ? ?Data Reviewed: ?Relevant notes from primary care and specialist visits, past discharge summaries as available in EHR, including Care Everywhere. ?Prior diagnostic testing as pertinent to current admission diagnoses ?Updated medications and problem lists for reconciliation ?ED course, including vitals, labs, imaging, treatment and response to treatment ?Triage notes, nursing and pharmacy notes and ED provider's notes ?Notable results as noted in HPI ?Urine drug screen is positive for tricyclic's which patient takes at home ?Serum sodium 136, potassium 3.7, chloride 104, bicarb 23, glucose 119, BUN 25, creatinine 1.30, calcium 8.6, albumin 3.4, total protein 6.9, white count 8.2, hemoglobin 10.9, hematocrit 33.8, platelet count 293 ?Urine analysis shows small leukocyte esterase ?Chest x-ray reviewed by me shows no evidence of acute cardiopulmonary disease ?CT scan of the head without contrast is Stable and normal for age noncontrast Head CT. ?Twelve-lead EKG reviewed by me shows sinus rhythm with low voltage QRS and LVH. ?There are no new results to review at this  time. ? ?Assessment and Plan: ?* TIA (transient ischemic attack) ?Patient presents to the ER for evaluation of a transient episode of disorientation with no focal deficits. ?Initial CT scan of the head with

## 2021-08-28 NOTE — Consult Note (Signed)
NEUROLOGY CONSULTATION NOTE  ? ?Date of service: August 28, 2021 ?Patient Name: Ann Bowen ?MRN:  [id number] ?DOB:  05/12/1956 ?Reason for consult: TIA ?Requesting physician: Dr. Francine Graven ?_ _ _   _ __   _ __ _ _  __ __   _ __   __ _ ? ?History of Present Illness  ? ?This is a 66 year old woman with a past medical history significant for depression, fibromyalgia, borderline personality disorder, who presents to the ED for evaluation of confusion and disorientation this morning.  When she woke up at 5:30 AM she told her husband that she was very confused and did not know where she was at that time.  She states that the symptoms lasted approximately 5 to 10 minutes, however has been told ED that they lasted longer and persist approximately 15 minutes into the drive.  ED was initially under the impression that this happened twice in the last week and never before however patient states that she actually started having events like this, only upon awakening, beginning 3 months ago after she started tizanidine nightly.  The disorientation upon awakening occurs more frequently when she gets less sleep (last night she went to bed at 0230 then woke up at 0530). She has never had a sleep study.  She is admitted for TIA and spell work-up. ?  ?ROS  ? ?Per HPI: all other systems reviewed and are negative ? ?Past History  ? ?I have reviewed the following: ? ?Past Medical History:  ?Diagnosis Date  ? Arthritis   ? Borderline personality disorder ([place])   ? Depression   ? Fibromyalgia   ? Shingles   ? ?Past Surgical History:  ?Procedure Laterality Date  ? CHOLECYSTECTOMY    ? COLONOSCOPY N/A 01/14/2021  ? Procedure: COLONOSCOPY;  Surgeon: [name], MD;  Location: [place];  Service: Endoscopy;  Laterality: N/A;  positive home test 12-11-20  ? COLONOSCOPY WITH PROPOFOL N/A 01/01/2018  ? Procedure: COLONOSCOPY WITH PROPOFOL;  Surgeon: Lin Landsman, MD;  Location: [facility] ENDOSCOPY;  Service: Gastroenterology;  Laterality:  N/A;  ? GASTRIC BYPASS    ? SHOULDER SURGERY    ? ?Family History  ?Problem Relation Age of Onset  ? Diabetes Mellitus II Mother   ? Diabetes Mellitus II Father   ? Hypertension Father   ? Diabetes Mellitus II Sister   ? Breast cancer Neg Hx   ? ?Social History  ? ?Socioeconomic History  ? Marital status: Married  ?  Spouse name: Not on file  ? Number of children: Not on file  ? Years of education: Not on file  ? Highest education level: Not on file  ?Occupational History  ? Not on file  ?Tobacco Use  ? Smoking status: Never  ? Smokeless tobacco: Never  ?Vaping Use  ? Vaping Use: Never used  ?Substance and Sexual Activity  ? Alcohol use: Yes  ?  Comment: occasionaly  ? Drug use: Never  ? Sexual activity: Not on file  ?Other Topics Concern  ? Not on file  ?Social History Narrative  ? ** Merged History Encounter **  ?    ? ?Social Determinants of Health  ? ?Financial Resource Strain: Not on file  ?Food Insecurity: Not on file  ?Transportation Needs: Not on file  ?Physical Activity: Not on file  ?Stress: Not on file  ?Social Connections: Not on file  ? ?Allergies  ?Allergen Reactions  ? Wellbutrin [Bupropion] Hives and Rash  ? ? ?Medications  ? ?(  Not in a hospital admission) ?  ? ?No current facility-administered medications for this encounter. ? ?Current Outpatient Medications:  ?  clonazePAM (KLONOPIN) 0.5 MG tablet, Take 1-2 tablets by mouth 3 (three) times daily as needed., Disp: , Rfl:  ?  doxepin (SINEQUAN) 10 MG capsule, 10 MG capsule Take 20 mg by mouth nightly, Disp: , Rfl:  ?  gabapentin (NEURONTIN) 300 MG capsule, Take by mouth., Disp: , Rfl:  ?  indomethacin (INDOCIN) 50 MG capsule, Take 50 mg by mouth 3 (three) times daily as needed., Disp: , Rfl:  ?  lamoTRIgine (LAMICTAL) 25 MG tablet, Take 50 mg by mouth daily., Disp: , Rfl:  ?  losartan (COZAAR) 25 MG tablet, Take 25 mg by mouth daily., Disp: , Rfl:  ?  meloxicam (MOBIC) 15 MG tablet, Take 1 tablet (15 mg total) by mouth daily., Disp: 30 tablet, Rfl:  0 ?  phentermine 30 MG capsule, Take 30 mg by mouth every morning., Disp: , Rfl:  ?  PROVENTIL HFA 108 (90 Base) MCG/ACT inhaler, Inhale 2 puffs into the lungs every 4 (four) hours as needed., Disp: , Rfl:  ?  rOPINIRole (REQUIP) 1 MG tablet, Take 1 mg by mouth in the morning and at bedtime. , Disp: , Rfl:  ?  tiZANidine (ZANAFLEX) 4 MG tablet, Take 4 mg by mouth at bedtime., Disp: , Rfl:  ?  apixaban (ELIQUIS) 5 MG TABS tablet, Take 1 tablet (5 mg total) by mouth 2 (two) times daily. (Patient not taking: Reported on 08/28/2021), Disp: 60 tablet, Rfl: 0 ?  betamethasone dipropionate (DIPROLENE) 0.05 % ointment, Apply topically 2 (two) times daily as needed (Rash). Avoid face, groin, underarms. (Patient not taking: Reported on 01/14/2021), Disp: 45 g, Rfl: 0 ?  bimatoprost (LATISSE) 0.03 % ophthalmic solution, Place 1 application into both eyes at bedtime. Place one drop on applicator and apply evenly along the skin of the upper eyelid at base of eyelashes once daily at bedtime; repeat procedure for second eye (use a clean applicator). (Patient not taking: Reported on 01/14/2021), Disp: 5 mL, Rfl: 12 ?  clonazePAM (KLONOPIN) 1 MG tablet, Take 1 mg by mouth at bedtime as needed. (Patient not taking: Reported on 08/28/2021), Disp: , Rfl:  ?  diclofenac (VOLTAREN) 75 MG EC tablet, Take 75 mg by mouth 2 (two) times daily. (Patient not taking: Reported on 01/14/2021), Disp: , Rfl:  ?  DIPROLENE 0.05 % ointment, Apply 1 application topically 2 (two) times daily. (Patient not taking: Reported on 01/14/2021), Disp: , Rfl:  ?  EPINEPHrine 0.3 mg/0.3 mL IJ SOAJ injection, Inject into the muscle., Disp: , Rfl:  ?  metroNIDAZOLE (METROCREAM) 0.75 % cream, Apply topically 2 (two) times daily. (Patient not taking: Reported on 01/14/2021), Disp: 45 g, Rfl: 3 ? ?Vitals  ? ?Vitals:  ? 08/28/21 0702 08/28/21 0703 08/28/21 0705 08/28/21 1000  ?BP: 103/64   109/60  ?Pulse: 71   94  ?Resp: 20   16  ?Temp:   97.9 ?F (36.6 ?C)   ?TempSrc:   Oral    ?SpO2: 98%   98%  ?Weight:  95.3 kg    ?Height:  '5\' 3"'$  (1.6 m)    ?  ? ?Body mass index is 37.2 kg/m?. ? ?Physical Exam  ? ?Physical Exam ?Gen: A&O x4, NAD ?HEENT: Atraumatic, normocephalic;mucous membranes moist; oropharynx clear, tongue without atrophy or fasciculations. ?Neck: Supple, trachea midline. ?Resp: CTAB, no w/r/r ?CV: RRR, no m/g/r; nml S1 and S2. 2+ symmetric peripheral  pulses. ?Abd: soft/NT/ND; nabs x 4 quad ?Extrem: Nml bulk; no cyanosis, clubbing, or edema. ? ?Neuro: ?*MS: A&O x4. Follows multi-step commands.  ?*Speech: fluid, nondysarthric, able to name and repeat ?*CN:  ?  I: Deferred ?  II,III: PERRLA, VFF by confrontation, optic discs unable to be visualized 2/2 pupillary constriction ?  III,IV,VI: EOMI w/o nystagmus, no ptosis ?  V: Sensation intact from V1 to V3 to LT ?  VII: Eyelid closure was full.  Smile symmetric. ?  VIII: Hearing intact to voice ?  IX,X: Voice normal, palate elevates symmetrically  ?  XI: SCM/trap 5/5 bilat   ?XII: Tongue protrudes midline, no atrophy or fasciculations  ? ?*Motor:   Normal bulk.  No tremor, rigidity or bradykinesia. No pronator drift. ? ?  Strength: Dlt Bic Tri WrE WrF FgS Gr HF KnF KnE PlF DoF  ?  Left '5 5 5 5 5 5 5 5 5 5 5 5  '$ ?  Right '5 5 5 5 5 5 5 5 5 5 5 5  '$ ? ? ?*Sensory: Intact to light touch, pinprick, temperature vibration throughout. Symmetric. Propioception intact bilat.  No double-simultaneous extinction.  ?*Coordination:  Finger-to-nose, heel-to-shin, rapid alternating motions were intact. ?*Reflexes:  2+ and symmetric throughout without clonus; toes down-going bilat ?*Gait: deferred ? ?NIHSS = 0 ? ? ?Premorbid mRS = 0 ? ? ?Labs  ? ?CBC:  ?Recent Labs  ?Lab 08/28/21 ?0728  ?WBC 8.2  ?NEUTROABS 5.7  ?HGB 10.9*  ?HCT 33.8*  ?MCV 86.7  ?PLT 293  ? ? ?Basic Metabolic Panel:  ?Lab Results  ?Component Value Date  ? NA 136 08/28/2021  ? K 3.7 08/28/2021  ? CO2 23 08/28/2021  ? GLUCOSE 119 (H) 08/28/2021  ? BUN 25 (H) 08/28/2021  ? CREATININE 1.30 (H)  08/28/2021  ? CALCIUM 8.6 (L) 08/28/2021  ? GFRNONAA 45 (L) 08/28/2021  ? GFRAA >60 10/06/2019  ? ?Lipid Panel:  ?Lab Results  ?Component Value Date  ? [place] 60 08/11/2015  ? ?HgbA1c:  ?Lab Results  ?Co

## 2021-08-29 ENCOUNTER — Observation Stay: Payer: Medicare Other

## 2021-08-29 LAB — LIPID PANEL
Cholesterol: 135 mg/dL (ref 0–200)
HDL: 32 mg/dL — ABNORMAL LOW (ref >40)
LDL Cholesterol: 74 mg/dL (ref 0–99)
Total CHOL/HDL Ratio: 4.2 RATIO
Triglycerides: 145 mg/dL (ref <150)
VLDL: 29 mg/dL (ref 0–40)

## 2021-08-29 LAB — BASIC METABOLIC PANEL
Anion gap: 4 — ABNORMAL LOW (ref 5–15)
Anion gap: 5 (ref 5–15)
BUN: 18 mg/dL (ref 8–23)
BUN: 16 mg/dL (ref 8–23)
CO2: 28 mmol/L (ref 22–32)
CO2: 29 mmol/L (ref 22–32)
Calcium: 8.2 mg/dL — ABNORMAL LOW (ref 8.9–10.3)
Calcium: 8.7 mg/dL — ABNORMAL LOW (ref 8.9–10.3)
Chloride: 106 mmol/L (ref 98–111)
Chloride: 107 mmol/L (ref 98–111)
Creatinine, Ser: 0.78 mg/dL (ref 0.44–1.00)
Creatinine, Ser: 0.79 mg/dL (ref 0.44–1.00)
GFR, Estimated: >60 mL/min (ref >60)
GFR, Estimated: >60 mL/min (ref >60)
Glucose, Bld: 103 mg/dL — ABNORMAL HIGH (ref 70–99)
Glucose, Bld: 157 mg/dL — ABNORMAL HIGH (ref 70–99)
Potassium: 3.8 mmol/L (ref 3.5–5.1)
Potassium: 4.7 mmol/L (ref 3.5–5.1)
Sodium: 138 mmol/L (ref 135–145)
Sodium: 141 mmol/L (ref 135–145)

## 2021-08-29 LAB — LDL CHOLESTEROL, DIRECT: Direct LDL: 77.5 mg/dL (ref 0–99)

## 2021-08-29 IMAGING — MR MR MRA HEAD W/O CM
1 of 2 series · 9 of 48 positions shown · non-contrast
Comparison: No pertinent prior exam.

CLINICAL DATA: Patient woke with confusion today. TIA. Symptoms
have since resolved.

EXAM:
MRA HEAD WITHOUT CONTRAST
TECHNIQUE: Angiographic images of the Circle of Willis were acquired using MRA
technique without intravenous contrast.

[Series 5: ax dwi_tracew · axial · 3.0mm · 0.65mm/px · z∈[-80,+75]mm · 9 of 48 slices shown]
[im 1/48]
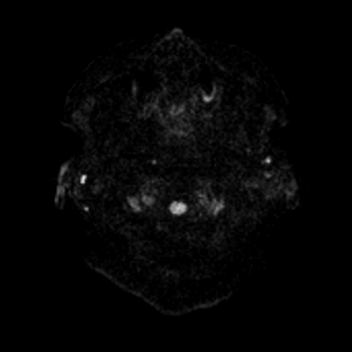
[im 6/48]
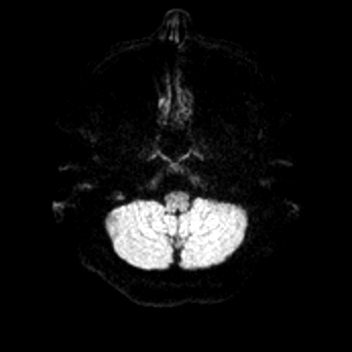
[im 12/48]
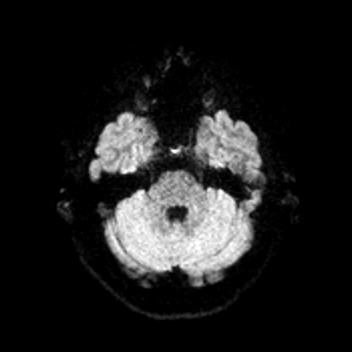
[im 18/48]
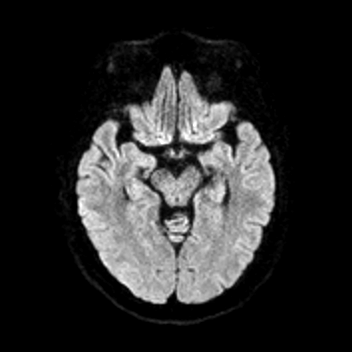
[im 24/48]
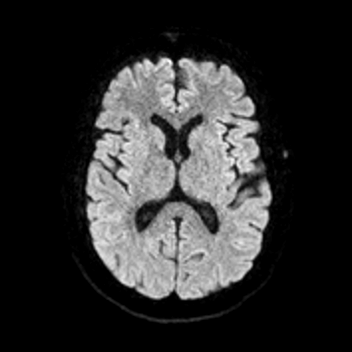
[im 30/48]
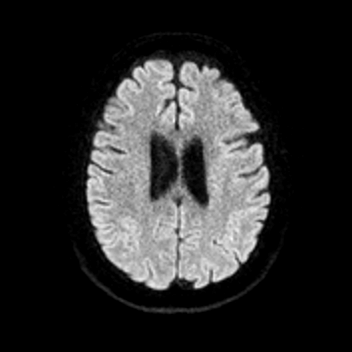
[im 36/48]
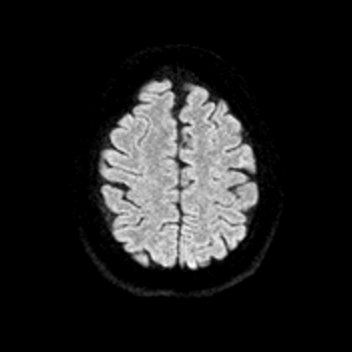
[im 42/48]
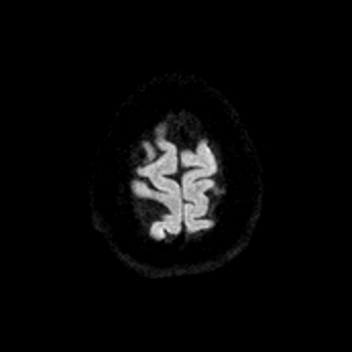
[im 48/48]
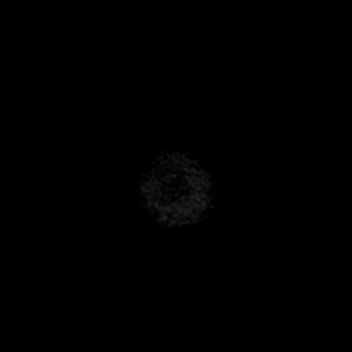

[9 of 48 positions shown; findings below may reference images not displayed]

FINDINGS: Anterior circulation: Tortuosity noted in the cervical left ICA.
Internal carotid arteries are otherwise within normal limits from
the high cervical segments through the ICA termini. The A1 and M1
segments are normal. The anterior communicating artery is patent.
MCA bifurcations are within normal limits. ACA and MCA branch
vessels are within normal limits.

Posterior circulation: Vertebral arteries are codominant. Right PICA
and left AI Ca origins visualized and normal. Basilar artery is
within normal limits. Both posterior cerebral arteries originate
from the basilar tip. Right posterior communicating artery
contributes. PCA branch vessels are within normal limits.

Anatomic variants: None

Other: None.
IMPRESSION: Normal MRA circle-of-Willis without significant proximal stenosis,
aneurysm, or branch vessel occlusion.

## 2021-08-29 IMAGING — MR MR MRA NECK WO/W CM
4 of 5 series · 36 of 48 positions shown · IV contrast (gadavist)
Comparison: MR head without and with contrast the same day.

CLINICAL DATA: Patient woke with confusion today. TIA. Symptoms
have since resolved.

EXAM:
MRA NECK WITHOUT AND WITH CONTRAST
TECHNIQUE: Multiplanar and multiecho pulse sequences of the neck were obtained
without and with intravenous contrast. Angiographic images of the
neck were obtained using MRA technique without and with intravenous
contrast.
CONTRAST:  10mL GADAVIST GADOBUTROL 1 MMOL/ML IV SOLN

[Series 13: angio_fl3d_cor_pre_ttc=2.0s · coronal · 0.9mm · 0.85mm/px · 9 of 96 slices shown]
[im 1/96]
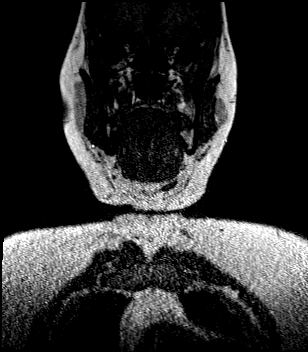
[im 12/96]
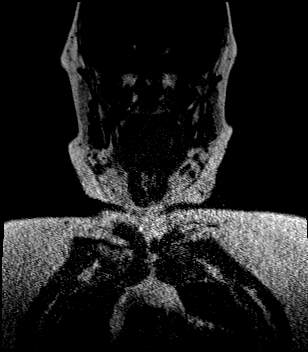
[im 24/96]
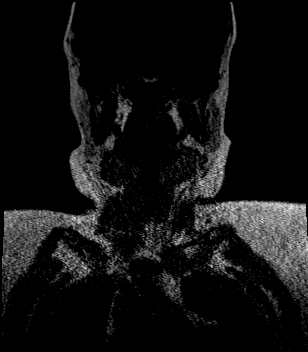
[im 36/96]
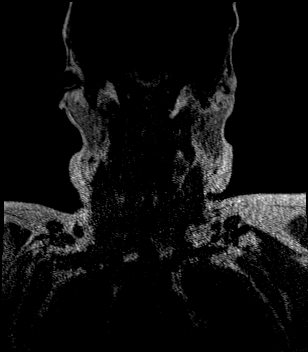
[im 48/96]
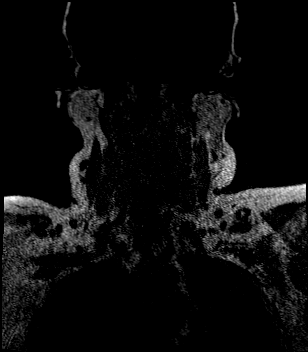
[im 60/96]
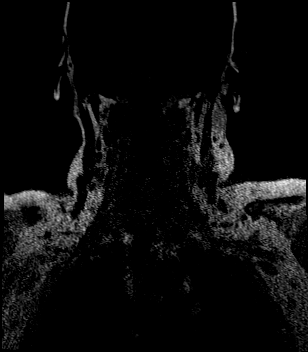
[im 72/96]
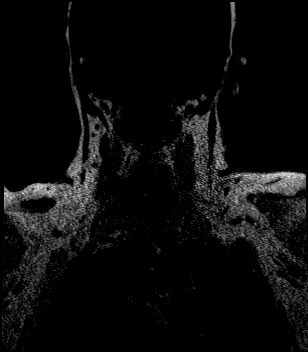
[im 84/96]
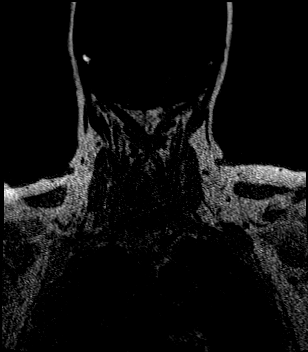
[im 96/96]
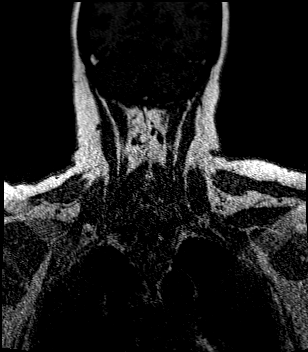

[Series 15: angio_fl3d_cor_post_ttc=2.0s · coronal · 0.9mm · 0.85mm/px · 9 of 96 slices shown]
[im 1/96]
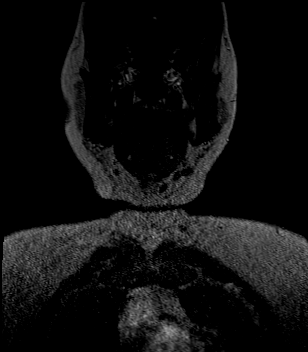
[im 12/96]
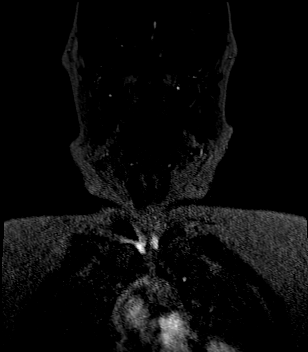
[im 24/96]
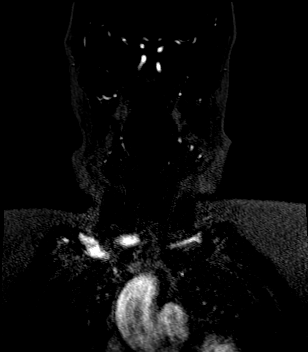
[im 36/96]
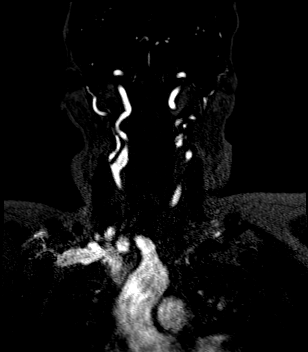
[im 48/96]
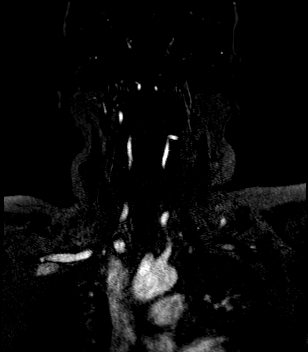
[im 60/96]
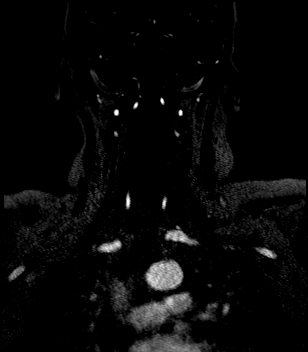
[im 72/96]
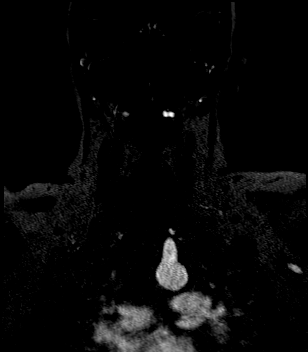
[im 84/96]
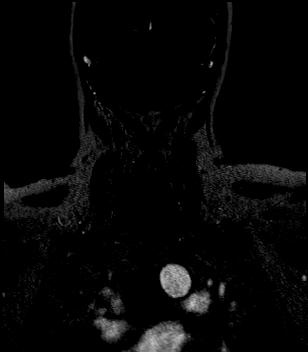
[im 96/96]
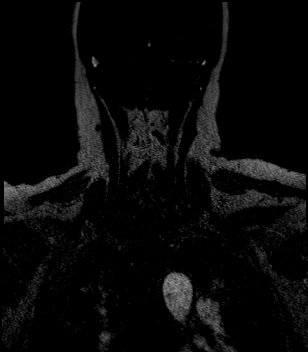

[Series 16: angio_fl3d_cor_post_ttc=2.0s_moco-adv · coronal · 0.9mm · 0.85mm/px · 9 of 96 slices shown]
[im 1/96]
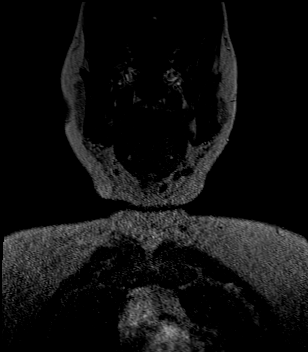
[im 12/96]
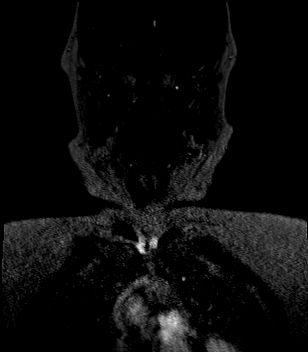
[im 24/96]
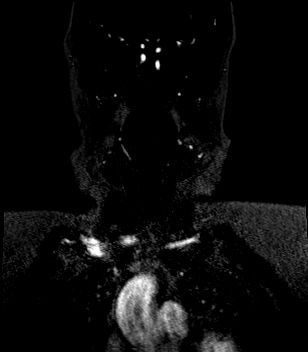
[im 36/96]
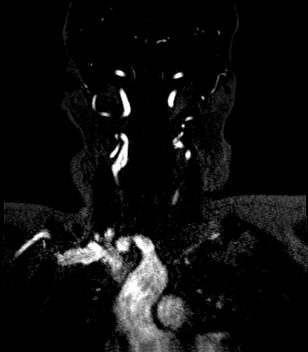
[im 48/96]
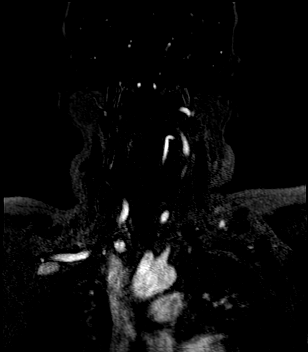
[im 60/96]
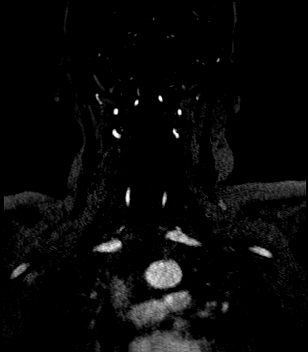
[im 72/96]
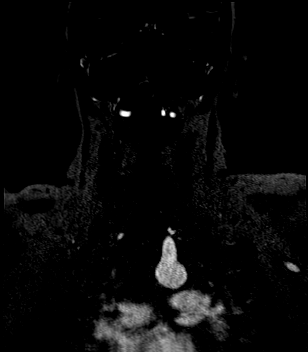
[im 84/96]
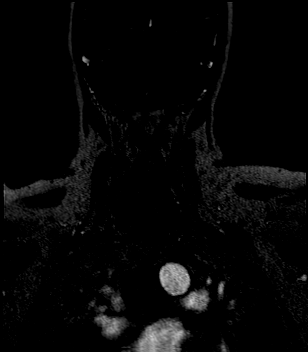
[im 96/96]
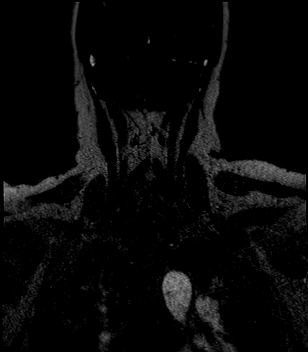

[Series 17: angio_fl3d_cor_post_ttc=2.0s_moco-adv_sub · coronal · 0.9mm · 0.85mm/px · 9 of 96 slices shown]
[im 1/96]
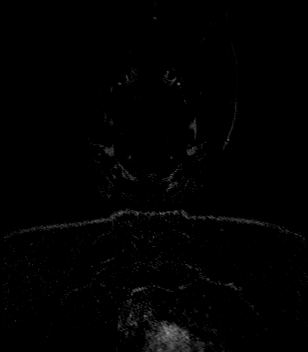
[im 12/96]
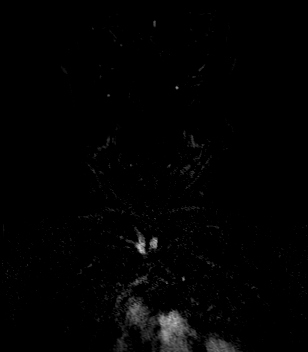
[im 24/96]
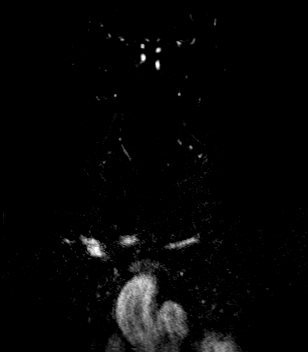
[im 36/96]
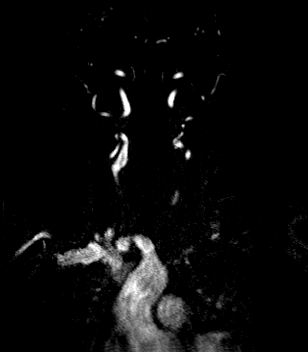
[im 48/96]
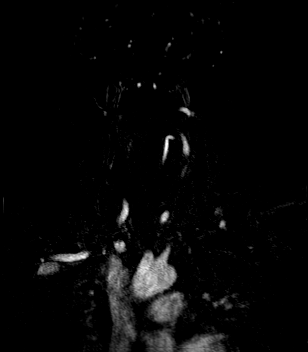
[im 60/96]
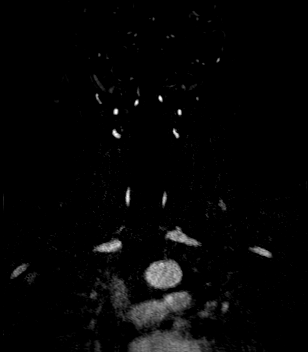
[im 72/96]
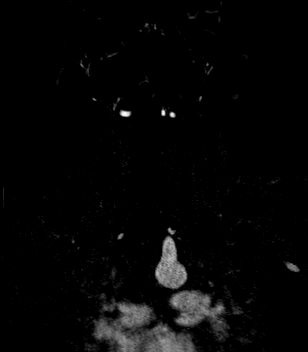
[im 84/96]
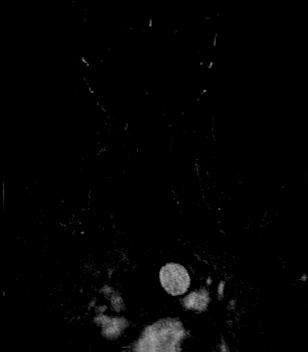
[im 96/96]
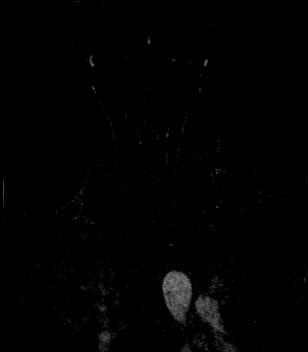

[36 of 48 positions shown; findings below may reference images not displayed]

FINDINGS: Time-of-flight images demonstrate no significant flow disturbance at
either carotid bifurcation. Flow is antegrade in the vertebral
arteries.

Postcontrast images demonstrate a 3 vessel arch configuration. No
focal stenosis or aneurysm is present.

The right common carotid artery is within normal limits. Bifurcation
is unremarkable. Mild tortuosity is present cervical right ICA
without significant stenosis.

Left common carotid artery is within normal limits. Bifurcation is
unremarkable. Mild tortuosity is present cervical left ICA without
significant stenosis.

The vertebral arteries are codominant. Both vertebral arteries
originate from the subclavian arteries. No focal stenosis is present
through the vertebrobasilar junction.
IMPRESSION: 1. Mild tortuosity of the cervical internal carotid arteries
bilaterally without significant stenosis. This is nonspecific, but
can be seen in the setting of chronic hypertension.
2. Otherwise normal MRA of the neck.

## 2021-08-29 IMAGING — MR MR HEAD WO/W CM
13 of 14 series · 38 of 48 positions shown · IV contrast (gadavist)
Comparison: CT head without contrast [DATE] and [DATE]

CLINICAL DATA: Patient awoke with confusion today.  TIA.

EXAM:
MRI HEAD WITHOUT AND WITH CONTRAST
TECHNIQUE: Multiplanar, multiecho pulse sequences of the brain and surrounding
structures were obtained without and with intravenous contrast.
CONTRAST:  10mL GADAVIST GADOBUTROL 1 MMOL/ML IV SOLN

[Series 5: ax dwi_tracew · axial · 3.0mm · 0.65mm/px · z∈[-80,+75]mm · 2 of 48 slices shown]
[im 1/48]
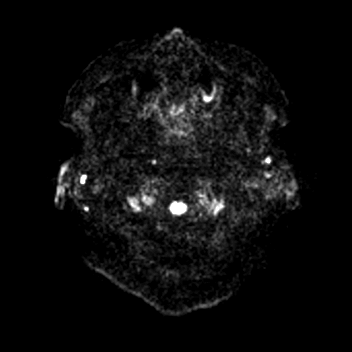
[im 48/48]
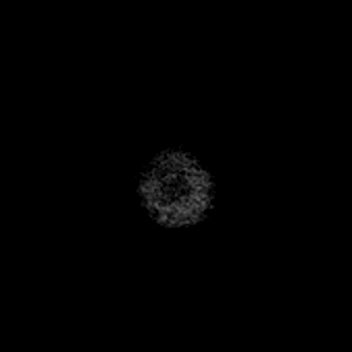

[Series 6: ax dwi_adc · axial · 3.0mm · 0.65mm/px · z∈[-80,+75]mm · 2 of 48 slices shown]
[im 1/48]
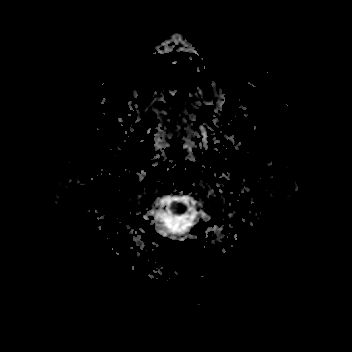
[im 48/48]
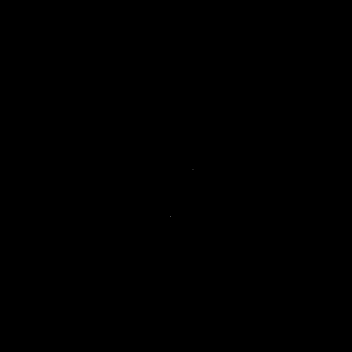

[Series 7: cor dwi_tracew · coronal · 5.0mm · 0.68mm/px · 2 of 40 slices shown]
[im 1/40]
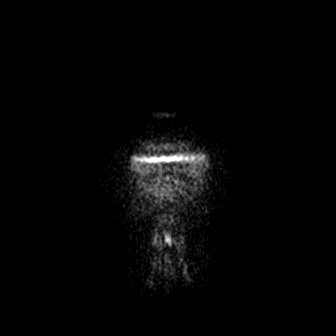
[im 40/40]
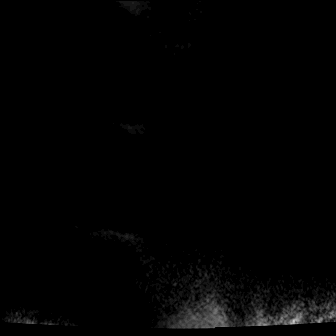

[Series 8: cor dwi_adc · coronal · 5.0mm · 0.68mm/px · 2 of 40 slices shown]
[im 1/40]
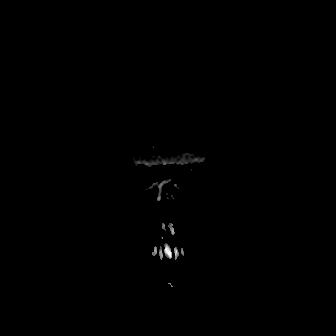
[im 40/40]
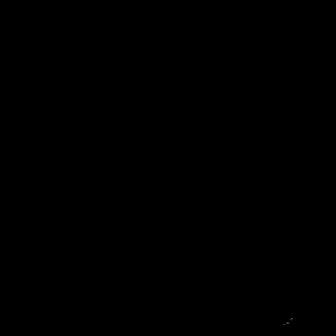

[Series 14: T1 · sagittal · 5.0mm · 0.62mm/px · 1 of 25 slices shown (1 of 2)]
[im 1/25]
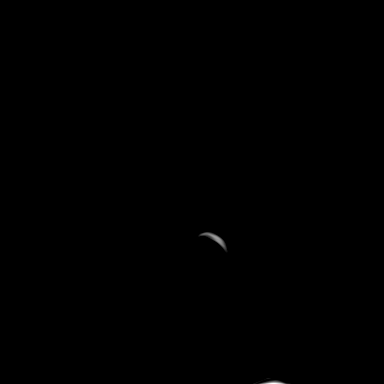

[Series 15: T2 · axial · 5.0mm · 0.53mm/px · 1 of 25 slices shown (1 of 2)]
[im 1/25]
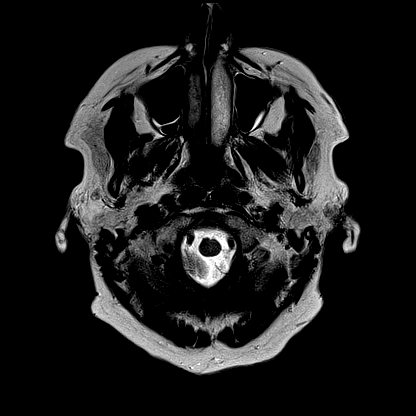

[Series 17: ax swi_pha · axial · 2.0mm · 0.90mm/px · z∈[-82,+76]mm · 4 of 80 slices shown]
[im 1/80]
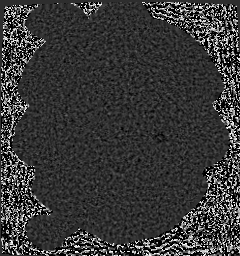
[im 27/80]
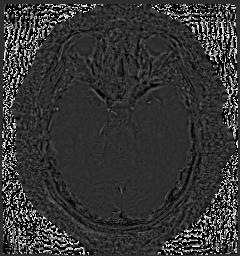
[im 53/80]
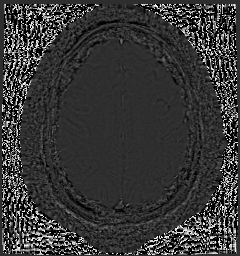
[im 80/80]
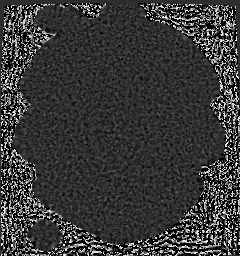

[Series 18: ax swi_swi · axial · 2.0mm · 0.90mm/px · z∈[-82,+76]mm · 4 of 80 slices shown]
[im 1/80]
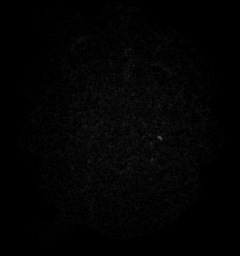
[im 27/80]
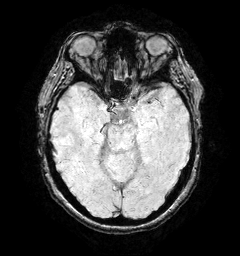
[im 53/80]
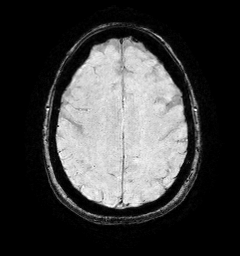
[im 80/80]
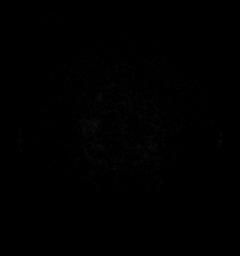

[Series 20: FLAIR · axial · 3.0mm · 0.53mm/px · z∈[-83,+78]mm · 2 of 55 slices shown]
[im 1/55]
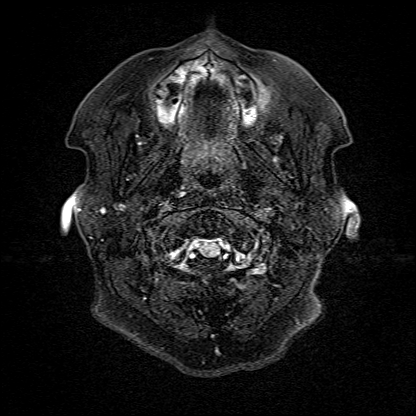
[im 55/55]
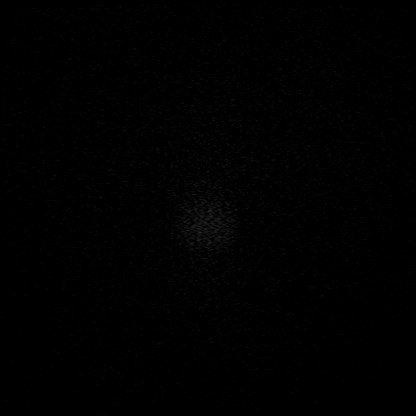

[Series 21: T1 · axial · 1.0mm · 0.98mm/px · z∈[-90,+84]mm · 8 of 175 slices shown (2 of 2)]
[im 1/175]
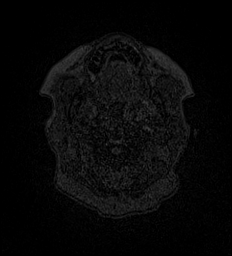
[im 25/175]
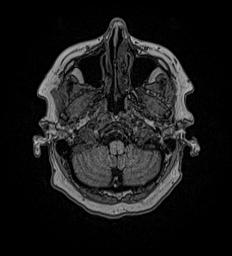
[im 50/175]
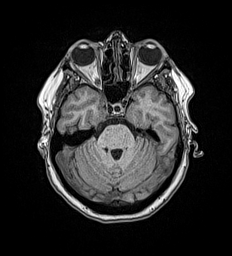
[im 75/175]
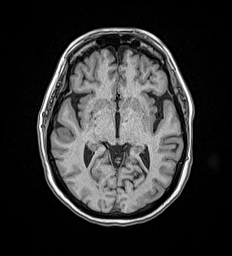
[im 100/175]
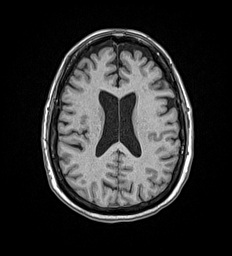
[im 125/175]
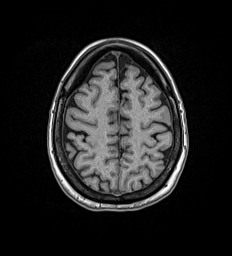
[im 150/175]
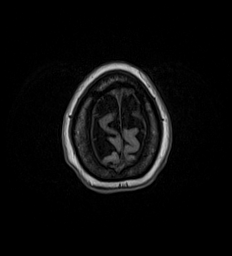
[im 175/175]
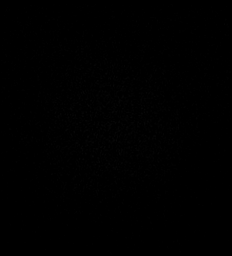

[Series 22: T2 · coronal · 5.0mm · 0.57mm/px · 1 of 29 slices shown (2 of 2)]
[im 1/29]
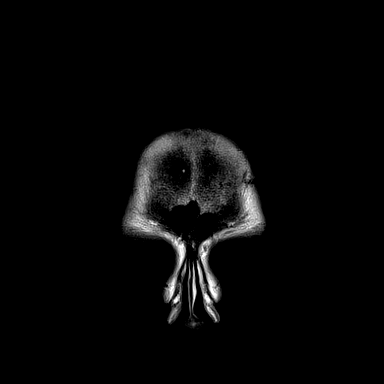

[Series 23: T1 post-contrast · axial · 1.0mm · 0.98mm/px · z∈[-90,+84]mm · 8 of 176 slices shown (1 of 2)]
[im 1/176]
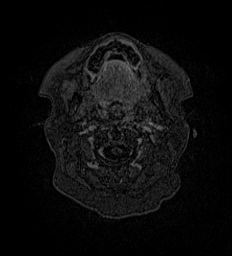
[im 26/176]
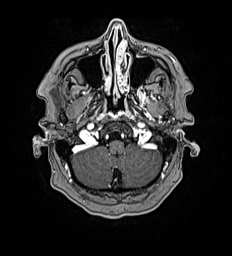
[im 51/176]
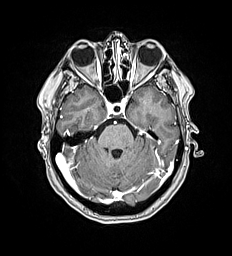
[im 76/176]
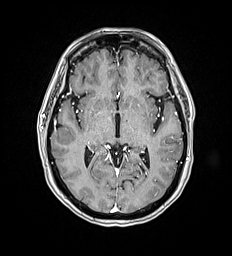
[im 101/176]
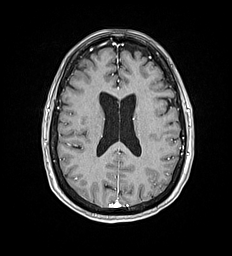
[im 126/176]
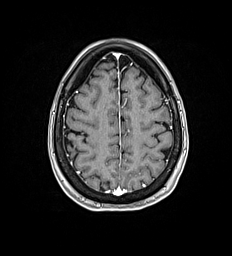
[im 151/176]
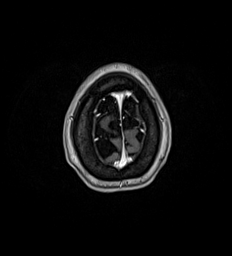
[im 176/176]
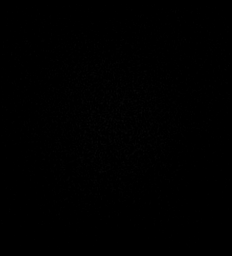

[Series 24: T1 post-contrast · coronal · 5.0mm · 0.57mm/px · 1 of 29 slices shown (2 of 2)]
[im 1/29]
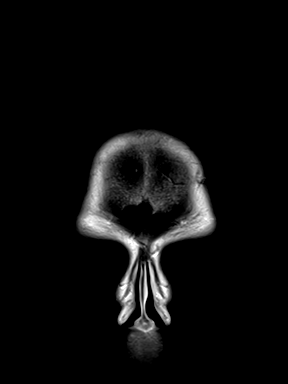

[38 of 48 positions shown; findings below may reference images not displayed]

FINDINGS: Brain: No acute infarct, hemorrhage, or mass lesion is present. No
significant white matter lesions are present. The ventricles are of
normal size. No significant extraaxial fluid collection is present.

The internal auditory canals are within normal limits. The brainstem
and cerebellum are within normal limits.

Vascular: Flow is present in the major intracranial arteries.

Skull and upper cervical spine: The craniocervical junction is
normal. Upper cervical spine is within normal limits. Marrow signal
is unremarkable.

Sinuses/Orbits: The paranasal sinuses and mastoid air cells are
clear. The globes and orbits are within normal limits.
IMPRESSION: Negative MRI of the brain.

## 2021-08-29 MED ORDER — GADOBUTROL 1 MMOL/ML IV SOLN
9.0000 mL | Freq: Once | INTRAVENOUS | Status: AC | PRN
  Administered 2021-08-29: 10 mL via INTRAVENOUS

## 2021-08-29 MED ORDER — LORAZEPAM 2 MG/ML IJ SOLN
1.0000 mg | Freq: Once | INTRAMUSCULAR | Status: AC | PRN
  Administered 2021-08-29: 1 mg via INTRAVENOUS
  Filled 2021-08-29: qty 1

## 2021-08-29 NOTE — Progress Notes (Incomplete)
?PROGRESS NOTE ? ? ? ?Ann Bowen  JIR:[id number] DOB: 1955-09-29 DOA: 08/28/2021 ?PCP: [facility]  ? ?Brief Narrative:  ?Per HPI: ?Ann Bowen is a 66 y.o. female with medical history significant for depression, fibromyalgia, borderline personality disorder who presents to the ER via private vehicle for evaluation of confusion and disorientation this morning. ?Patient was in her usual state of health and went to bed at about 2:30 AM on the morning of presentation.  She woke up at about 5:30 AM and according to her husband at the bedside was very disoriented and did not know where she was or how she got there.  Due to persistence of her symptoms he decided to bring her to the ER to get checked out when they arrived at the hospital she refused to get down from the vehicle.  He started driving home but about 15 minutes into the drive patient's mental status improved and she agreed with coming to the ER for evaluation. ?She has no focal deficits, denies having any headache, no difficulty swallowing, no blurred vision, no dizziness or lightheadedness. ?She states that she has had episodes of disorientation in the past but none lasting this long. ?She denies having any fever, no chills, no cough, no urinary symptoms, no changes in her bowel habits, no abdominal pain, no leg swelling, no blurred vision or focal deficit. ? ? ?Assessment & Plan: ?  ?Principal Problem: ?  TIA (transient ischemic attack) ?Active Problems: ?  Depression with anxiety ?  AKI (acute kidney injury) ([place]) ?  Obesity (BMI 30-39.9) ? ? ?TIA (transient ischemic attack) ?Patient presents to the ER for evaluation of a transient episode of disorientation with no focal deficits. ?Initial CT scan of the head without contrast is negative for bleed ?Neurology consult with orders for MRI, MRA of head neck - all pending ?Obtain 2D echocardiogram to assess LVEF and rule out cardiac thrombus - pending ?PT and OT consult reviewed no  recommendations ?Continue aspirin and high intensity statins ?  ?Depression with anxiety ?Continue doxepin ?  ?Obesity (BMI 30-39.9) ?BMI 37.20 kg/m2 ?Complicates overall prognosis and care ?Lifestyle modification and exercise has been discussed with patient in detail ?  ?AKI (acute kidney injury) ([place]) ?Patient noted to have an AKI most likely secondary to NSAID use for osteoarthritis. ?Baseline serum creatinine is 0.8 and on admission it is 1.30. ?BUN is also elevated ?Hold NSAIDs for now ?Hydrate patient with normal saline ?Follow-up BMP - improved renal fx ? ?DVT prophylaxis: Lovenox SQ  ?Code Status: full code ? ?  ?Code Status Orders  ?(From admission, onward)  ?  ? ? ?  ? ?  Start     Ordered  ? 08/28/21 1247  Full code  Continuous       ? 08/28/21 1247  ? ?  ?  ? ?  ? ?Code Status History   ? ? Date Active Date Inactive Code Status Order ID Comments User Context  ? 12/15/2020 0034 12/15/2020 2337 Full Code [id number]  Marcelyn Bruins, MD ED  ? 12/13/2020 0228 12/14/2020 1610 Full Code [id number]  Athena Masse, MD ED  ? 10/05/2019 1810 10/06/2019 1712 Full Code [id number]  Ivor Costa, MD ED  ? 08/11/2015 0218 08/13/2015 1312 Full Code [id number]  Hildred Priest, MD Inpatient  ? ?  ? ?Family Communication: updated husband on paln of care  ?Disposition Plan:   pending further workup, unsafe for discharge, evakuating for possible tia vs cva ?Consults called: None ?Admission  status: Observation ? ? ?Consultants:  ?neurology ? ?Procedures:  ?CT Head Wo Contrast ? ?Result Date: 08/28/2021 ?CLINICAL DATA:  66 year old female with confusion and altered mental status. TIA. EXAM: CT HEAD WITHOUT CONTRAST TECHNIQUE: Contiguous axial images were obtained from the base of the skull through the vertex without intravenous contrast. RADIATION DOSE REDUCTION: This exam was performed according to the departmental dose-optimization program which includes automated exposure control, adjustment of the mA and/or kV according to  patient size and/or use of iterative reconstruction technique. COMPARISON:  Head CT 12/14/2020. FINDINGS: Brain: Cerebral volume is stable and within normal limits for age. No midline shift, ventriculomegaly, mass effect, evidence of mass lesion, intracranial hemorrhage or evidence of cortically based acute infarction. Gray-white matter differentiation is within normal limits throughout the brain. Vascular: Calcified atherosclerosis at the skull base. No suspicious intracranial vascular hyperdensity. Skull: No acute osseous abnormality identified. Sinuses/Orbits: Visualized paranasal sinuses and mastoids are stable and well aerated. Other: Visualized orbits and scalp soft tissues are within normal limits. IMPRESSION: Stable and normal for age noncontrast Head CT. Electronically Signed   By: Genevie Ann M.D.   On: 08/28/2021 08:40  ? ?DG Chest Portable 1 View ? ?Result Date: 08/28/2021 ?CLINICAL DATA:  66 year old female with history of altered mental status. EXAM: PORTABLE CHEST 1 VIEW COMPARISON:  Chest x-ray 12/20/2020. FINDINGS: Lung volumes are normal. No consolidative airspace disease. No pleural effusions. No pneumothorax. No pulmonary nodule or mass noted. Pulmonary vasculature and the cardiomediastinal silhouette are within normal limits. Atherosclerosis in the thoracic aorta. IMPRESSION: 1.  No radiographic evidence of acute cardiopulmonary disease. 2. Aortic atherosclerosis. Electronically Signed   By: Vinnie Langton M.D.   On: 08/28/2021 07:42  ? ?ECHOCARDIOGRAM COMPLETE BUBBLE STUDY ? ?Result Date: 08/28/2021 ?   ECHOCARDIOGRAM REPORT   Patient Name:   Ann Bowen Date of Exam: 08/28/2021 Medical Rec #:  [id number]    Height:       63.0 in Accession #:    [id number]   Weight:       210.0 lb Date of Birth:  1955/08/13    BSA:          1.974 m? Patient Age:    34 years     BP:           112/62 mmHg Patient Gender: F            HR:           77 bpm. Exam Location:  [facility] Procedure: 2D Echo and Saline Contrast  Bubble Study Indications:     TIA G45.9  History:         Patient has no prior history of Echocardiogram examinations.  Sonographer:     Kathlen Brunswick RDCS Referring Phys:  [place] Diagnosing Phys: [place]  1. Left ventricular ejection fraction, by estimation, is 60 to 65%. The left ventricle has normal function. The left ventricle has no regional wall motion abnormalities. There is mild left ventricular hypertrophy. Left ventricular diastolic parameters were normal.  2. Right ventricular systolic function is normal. The right ventricular size is normal.  3. The mitral valve is normal in structure. No evidence of mitral valve regurgitation. No evidence of mitral stenosis.  4. The aortic valve is calcified. Aortic valve regurgitation is not visualized. No aortic stenosis is present.  5. The inferior vena cava is normal in size with greater than 50% respiratory variability, suggesting right atrial pressure of 3 mmHg.  6. Agitated saline  contrast bubble study was negative, with no evidence of any interatrial shunt. FINDINGS  Left Ventricle: Left ventricular ejection fraction, by estimation, is 60 to 65%. The left ventricle has normal function. The left ventricle has no regional wall motion abnormalities. The left ventricular internal cavity size was normal in size. There is  mild left ventricular hypertrophy. Left ventricular diastolic parameters were normal. Right Ventricle: The right ventricular size is normal. No increase in right ventricular wall thickness. Right ventricular systolic function is normal. Left Atrium: Left atrial size was normal in size. Right Atrium: Right atrial size was normal in size. Pericardium: There is no evidence of pericardial effusion. Mitral Valve: The mitral valve is normal in structure. No evidence of mitral valve regurgitation. No evidence of mitral valve stenosis. Tricuspid Valve: The tricuspid valve is normal in structure. Tricuspid valve  regurgitation is trivial. No evidence of tricuspid stenosis. Aortic Valve: The aortic valve is calcified. Aortic valve regurgitation is not visualized. No aortic stenosis is present. Aortic valve peak gradient measures 7.8 mm

## 2021-08-30 DIAGNOSIS — D649 Anemia, unspecified: Secondary | ICD-10-CM

## 2021-08-30 DIAGNOSIS — D519 Vitamin B12 deficiency anemia, unspecified: Secondary | ICD-10-CM

## 2021-08-30 DIAGNOSIS — E669 Obesity, unspecified: Secondary | ICD-10-CM

## 2021-08-30 DIAGNOSIS — N179 Acute kidney failure, unspecified: Secondary | ICD-10-CM

## 2021-08-30 DIAGNOSIS — F418 Other specified anxiety disorders: Secondary | ICD-10-CM | POA: Diagnosis not present

## 2021-08-30 DIAGNOSIS — T50905S Adverse effect of unspecified drugs, medicaments and biological substances, sequela: Secondary | ICD-10-CM

## 2021-08-30 DIAGNOSIS — T50905A Adverse effect of unspecified drugs, medicaments and biological substances, initial encounter: Secondary | ICD-10-CM

## 2021-08-30 LAB — HEMOGLOBIN A1C
Hgb A1c MFr Bld: <4.2 % — ABNORMAL LOW (ref 4.8–5.6)
Mean Plasma Glucose: <74 mg/dL

## 2021-08-30 MED ORDER — ASPIRIN 81 MG PO CHEW: 81.0000 mg | CHEWABLE_TABLET | Freq: Every day | ORAL | 0 refills | Status: DC

## 2021-08-30 MED ORDER — ACETAMINOPHEN 325 MG PO TABS
650.0000 mg | ORAL_TABLET | Freq: Four times a day (QID) | ORAL | Status: AC | PRN
Start: 2021-08-30 — End: ?

## 2021-08-30 NOTE — Assessment & Plan Note (Signed)
I believe this was a little adverse drug reaction secondary to tizanidine.  This can cause altered mental status and confusion. ?

## 2021-08-30 NOTE — Progress Notes (Signed)
Patient being discharged home. IV removed. Went over discharge instructions with patient and patients husband. Patient going home POV with husband. ?

## 2021-08-30 NOTE — Plan of Care (Signed)
Neurology plan of care ? ?MRI wwo contrast - neg ? ?MRA H&N - no hemodynamically significant stenoses ? ?TTE - no intracardiac clot, no other sig abnl, (-) PFO ? ?Stroke Labs ? ?   ?Component Value Date/Time  ? CHOL 135 08/29/2021 0459  ? TRIG 145 08/29/2021 0459  ? HDL 32 (L) 08/29/2021 0459  ? CHOLHDL 4.2 08/29/2021 0459  ? VLDL 29 08/29/2021 0459  ? [place] 74 08/29/2021 0459  ? LDLDIRECT 77.5 08/28/2021 0728  ? ? ?Lab Results  ?Component Value Date/Time  ? HGBA1C 4.4 08/11/2015 06:55 AM  ? ?Final recommendations: ?- ASA '81mg'$  daily ?- No indication for statin, patient is at goal without ?- Wean tizanidine if patient amenable, see consult recommendations from yesterday ?- OK to discharge from neuro standpoint. I will arrange outpatient neuro f/u, at which point sleep study +/- EEG may be considered ? ?Su Monks, MD ?[facility] ?[phone] ? ?If 7pm- 7am, please page neurology on call as listed in [place]. ? ?

## 2021-08-30 NOTE — Discharge Summary (Signed)
?Physician Discharge Summary ?  ?Patient: Ann Bowen MRN: [id number] DOB: [birth year]  ?Admit date:     08/28/2021  ?Discharge date: 08/30/21  ?Discharge Physician: Loletha Grayer  ? ?PCP: [facility]  ? ?Recommendations at discharge:  ? ?Follow-up PCP 5 days ? ?Discharge Diagnoses: ?Principal Problem: ?  Adverse drug reaction ?Active Problems: ?  TIA (transient ischemic attack) ?  Depression with anxiety ?  AKI (acute kidney injury) ([place]) ?  Obesity (BMI 30-39.9) ?  Anemia, unspecified ? ? ?Hospital Course: ?The patient was admitted to the hospital on 08/28/2021 and discharged on 08/30/2021.  Patient came in with altered mental status and confusion.  Neurology was consulted and stroke work-up completed.  MRI of the brain and MRA of the carotids and brain were negative.  Patient was started on aspirin.  For the patient's acute kidney injury the patient was hydrated.  I believe a lot of the patient's symptoms were secondary to tizanidine which was recently started.  Patient's mental status and kidney function improved. ? ?Assessment and Plan: ?* Adverse drug reaction ?I believe this was a little adverse drug reaction secondary to tizanidine.  This can cause altered mental status and confusion. ? ?TIA (transient ischemic attack) ?Possibility of TIA but more likely drug reaction.  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  Patient seen by neurology.  Stroke work-up is negative.  Continue aspirin.  No indication for statin since LDL at goal. ? ?Depression with anxiety ?Continue doxepin ? ?Anemia, unspecified ?Last hemoglobin 10.9.  A ferritin was done on 12/13/2020 of 118 which goes along with anemia of chronic disease.  Recommend following hemoglobin as outpatient. ? ?Obesity (BMI 30-39.9) ?BMI 37.20  ? ?AKI (acute kidney injury) ([place]) ?Acute kidney injury.  This has resolved.  Creatinine 1.3 on presentation down to 0.79. ? ? ? ? ?  ? ? ?Consultants:  Neurology ?Procedures performed: None ?Disposition: Home ?Diet recommendation:  ?Cardiac diet ?DISCHARGE MEDICATION: ?Allergies as of 08/30/2021   ? ?   Reactions  ? Wellbutrin [bupropion] Hives, Rash  ? ?  ? ?  ?Medication List  ?  ? ?STOP taking these medications   ? ?apixaban 5 MG Tabs tablet ?Commonly known as: ELIQUIS ?  ?betamethasone dipropionate 0.05 % ointment ?Commonly known as: DIPROLENE ?  ?bimatoprost 0.03 % ophthalmic solution ?Commonly known as: LATISSE ?  ?clonazePAM 0.5 MG tablet ?Commonly known as: KLONOPIN ?  ?clonazePAM 1 MG tablet ?Commonly known as: KLONOPIN ?  ?diclofenac 75 MG EC tablet ?Commonly known as: VOLTAREN ?  ?Diprolene 0.05 % ointment ?Generic drug: augmented betamethasone dipropionate ?  ?indomethacin 50 MG capsule ?Commonly known as: INDOCIN ?  ?metroNIDAZOLE 0.75 % cream ?Commonly known as: METROCREAM ?  ?phentermine 30 MG capsule ?  ?tiZANidine 4 MG tablet ?Commonly known as: ZANAFLEX ?  ? ?  ? ?TAKE these medications   ? ?acetaminophen 325 MG tablet ?Commonly known as: TYLENOL ?Take 2 tablets (650 mg total) by mouth every 6 (six) hours as needed for mild pain (or temp > 37.5 C (99.5 F)). ?  ?aspirin 81 MG chewable tablet ?Chew 1 tablet (81 mg total) by mouth daily. ?  ?doxepin 10 MG capsule ?Commonly known as: SINEQUAN ?10 MG capsule Take 20 mg by mouth nightly ?  ?EPINEPHrine 0.3 mg/0.3 mL Soaj injection ?Commonly known as: EPI-PEN ?Inject into the muscle. ?  ?gabapentin 300 MG capsule ?Commonly known as: NEURONTIN ?Take by mouth. ?  ?lamoTRIgine  25 MG tablet ?Commonly known as: LAMICTAL ?Take 50 mg by mouth daily. ?  ?losartan 25 MG tablet ?Commonly known as: COZAAR ?Take 25 mg by mouth daily. ?  ?meloxicam 15 MG tablet ?Commonly known as: MOBIC ?Take 1 tablet (15 mg total) by mouth daily. ?  ?Proventil HFA 108 (90 Base) MCG/ACT inhaler ?Generic drug: albuterol ?Inhale 2 puffs into the lungs every 4 (four) hours as needed. ?  ?rOPINIRole 1 MG tablet ?Commonly known as:  REQUIP ?Take 1 mg by mouth in the morning and at bedtime. ?  ? ?  ? ? Follow-up Information   ? ? [facility]. Go on 09/08/2021.   ?Specialty: General Practice ?Why: '@11'$ :00am ?Contact information: ?[address] ?[place] [address] ?[phone] ? ? ?  ?  ? ?  ?  ? ?  ? ?Discharge Exam: ?Danley Danker Weights  ? 08/28/21 0703  ?Weight: 95.3 kg  ? ?Physical Exam ?HENT:  ?   Head: Normocephalic.  ?   Mouth/Throat:  ?   Pharynx: No oropharyngeal exudate.  ?Eyes:  ?   General: Lids are normal.  ?   Conjunctiva/sclera: Conjunctivae normal.  ?Cardiovascular:  ?   Rate and Rhythm: Normal rate and regular rhythm.  ?   Heart sounds: Normal heart sounds, S1 normal and S2 normal.  ?Pulmonary:  ?   Breath sounds: No decreased breath sounds, wheezing, rhonchi or rales.  ?Abdominal:  ?   Palpations: Abdomen is soft.  ?   Tenderness: There is no abdominal tenderness.  ?Musculoskeletal:  ?   Right lower leg: No swelling.  ?   Left lower leg: No swelling.  ?Skin: ?   General: Skin is warm.  ?   Findings: No rash.  ?Neurological:  ?   Mental Status: She is alert and oriented to person, place, and time.  ?   Comments: Power 5 out of 5 bilateral upper and lower extremities.  ?  ? ?Condition at discharge: stable ? ?The results of significant diagnostics from this hospitalization (including imaging, microbiology, ancillary and laboratory) are listed below for reference.  ? ?Imaging Studies: ?CT Head Wo Contrast ? ?Result Date: 08/28/2021 ?CLINICAL DATA:  66 year old female with confusion and altered mental status. TIA. EXAM: CT HEAD WITHOUT CONTRAST TECHNIQUE: Contiguous axial images were obtained from the base of the skull through the vertex without intravenous contrast. RADIATION DOSE REDUCTION: This exam was performed according to the departmental dose-optimization program which includes automated exposure control, adjustment of the mA and/or kV according to patient size and/or use of iterative reconstruction  technique. COMPARISON:  Head CT 12/14/2020. FINDINGS: Brain: Cerebral volume is stable and within normal limits for age. No midline shift, ventriculomegaly, mass effect, evidence of mass lesion, intracranial hemorrhage or evidence of cortically based acute infarction. Gray-white matter differentiation is within normal limits throughout the brain. Vascular: Calcified atherosclerosis at the skull base. No suspicious intracranial vascular hyperdensity. Skull: No acute osseous abnormality identified. Sinuses/Orbits: Visualized paranasal sinuses and mastoids are stable and well aerated. Other: Visualized orbits and scalp soft tissues are within normal limits. IMPRESSION: Stable and normal for age noncontrast Head CT. Electronically Signed   By: Genevie Ann M.D.   On: 08/28/2021 08:40  ? ?MR ANGIO HEAD WO CONTRAST ? ?Result Date: 08/29/2021 ?CLINICAL DATA:  Patient woke with confusion today. TIA. Symptoms have since resolved. EXAM: MRA HEAD WITHOUT CONTRAST TECHNIQUE: Angiographic images of the Circle of Willis were acquired using MRA technique without intravenous contrast. COMPARISON:  No pertinent  prior exam. FINDINGS: Anterior circulation: Tortuosity noted in the cervical left ICA. Internal carotid arteries are otherwise within normal limits from the high cervical segments through the ICA termini. The A1 and M1 segments are normal. The anterior communicating artery is patent. MCA bifurcations are within normal limits. ACA and MCA branch vessels are within normal limits. Posterior circulation: Vertebral arteries are codominant. Right PICA and left AI Ca origins visualized and normal. Basilar artery is within normal limits. Both posterior cerebral arteries originate from the basilar tip. Right posterior communicating artery contributes. PCA branch vessels are within normal limits. Anatomic variants: None Other: None. IMPRESSION: Normal MRA circle-of-Willis without significant proximal stenosis, aneurysm, or branch vessel  occlusion. Electronically Signed   By: San Morelle M.D.   On: 08/29/2021 17:17  ? ?MR ANGIO NECK W WO CONTRAST ? ?Result Date: 08/29/2021 ?CLINICAL DATA:  Patient woke with confusion today. TIA. Symptoms have since resolved.

## 2021-08-30 NOTE — Assessment & Plan Note (Signed)
Last hemoglobin 10.9.  A ferritin was done on 12/13/2020 of 118 which goes along with anemia of chronic disease.  Recommend following hemoglobin as outpatient. ?

## 2021-09-06 ENCOUNTER — Emergency Department: Payer: Medicare Other

## 2021-09-06 ENCOUNTER — Other Ambulatory Visit: Payer: Self-pay

## 2021-09-06 ENCOUNTER — Emergency Department
Admission: EM | Admit: 2021-09-06 | Discharge: 2021-09-06 | Disposition: A | Discharge status: Home / Self Care | Payer: Medicare Other | Attending: Emergency Medicine | Admitting: Emergency Medicine

## 2021-09-06 DIAGNOSIS — Z7982 Long term (current) use of aspirin: Secondary | ICD-10-CM | POA: Insufficient documentation

## 2021-09-06 DIAGNOSIS — M79672 Pain in left foot: Secondary | ICD-10-CM | POA: Diagnosis present

## 2021-09-06 DIAGNOSIS — M79671 Pain in right foot: Secondary | ICD-10-CM | POA: Insufficient documentation

## 2021-09-06 DIAGNOSIS — R944 Abnormal results of kidney function studies: Secondary | ICD-10-CM | POA: Insufficient documentation

## 2021-09-06 DIAGNOSIS — D649 Anemia, unspecified: Secondary | ICD-10-CM | POA: Diagnosis not present

## 2021-09-06 LAB — BASIC METABOLIC PANEL
Anion gap: 5 (ref 5–15)
BUN: 23 mg/dL (ref 8–23)
CO2: 25 mmol/L (ref 22–32)
Calcium: 8.4 mg/dL — ABNORMAL LOW (ref 8.9–10.3)
Chloride: 105 mmol/L (ref 98–111)
Creatinine, Ser: 1.26 mg/dL — ABNORMAL HIGH (ref 0.44–1.00)
GFR, Estimated: 47 mL/min — ABNORMAL LOW (ref >60)
Glucose, Bld: 113 mg/dL — ABNORMAL HIGH (ref 70–99)
Potassium: 4 mmol/L (ref 3.5–5.1)
Sodium: 135 mmol/L (ref 135–145)

## 2021-09-06 LAB — CBC
HCT: 31.6 % — ABNORMAL LOW (ref 36.0–46.0)
Hemoglobin: 10 g/dL — ABNORMAL LOW (ref 12.0–15.0)
MCH: 28.2 pg (ref 26.0–34.0)
MCHC: 31.6 g/dL (ref 30.0–36.0)
MCV: 89 fL (ref 80.0–100.0)
Platelets: 254 10*3/uL (ref 150–400)
RBC: 3.55 MIL/uL — ABNORMAL LOW (ref 3.87–5.11)
RDW: 14 % (ref 11.5–15.5)
WBC: 10.6 10*3/uL — ABNORMAL HIGH (ref 4.0–10.5)
nRBC: 0 % (ref 0.0–0.2)

## 2021-09-06 LAB — CK: Total CK: 99 U/L (ref 38–234)

## 2021-09-06 LAB — MAGNESIUM: Magnesium: 2.4 mg/dL (ref 1.7–2.4)

## 2021-09-06 IMAGING — DX DG FOOT 2V*R*
2 series · 2 of 2 positions shown · non-contrast
Comparison: [DATE]

CLINICAL DATA: Bilateral foot pain.  No known injury.

EXAM:
RIGHT FOOT - 2 VIEW

[foot ap]
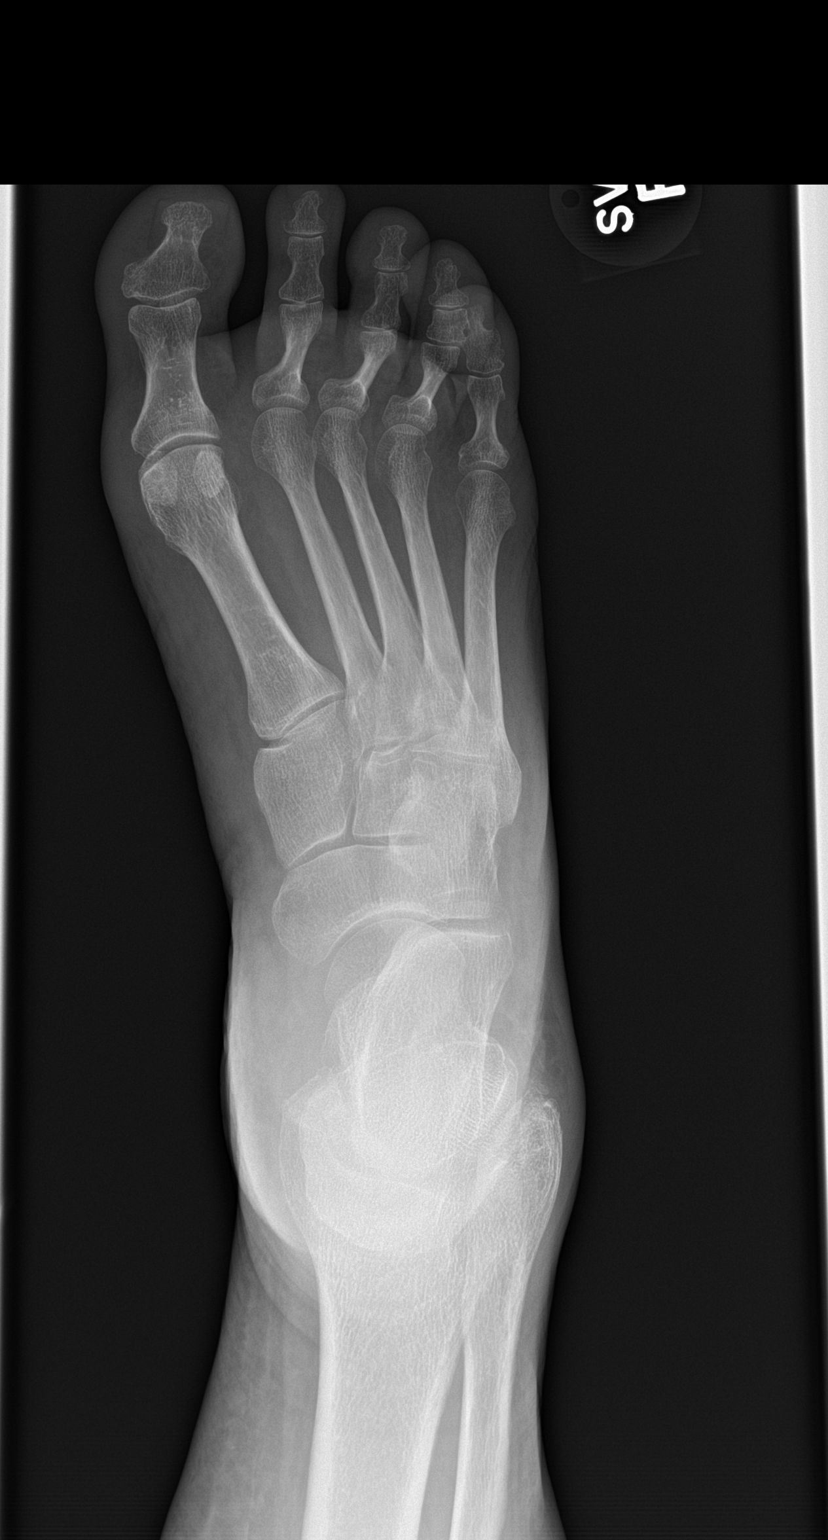

[foot lat]
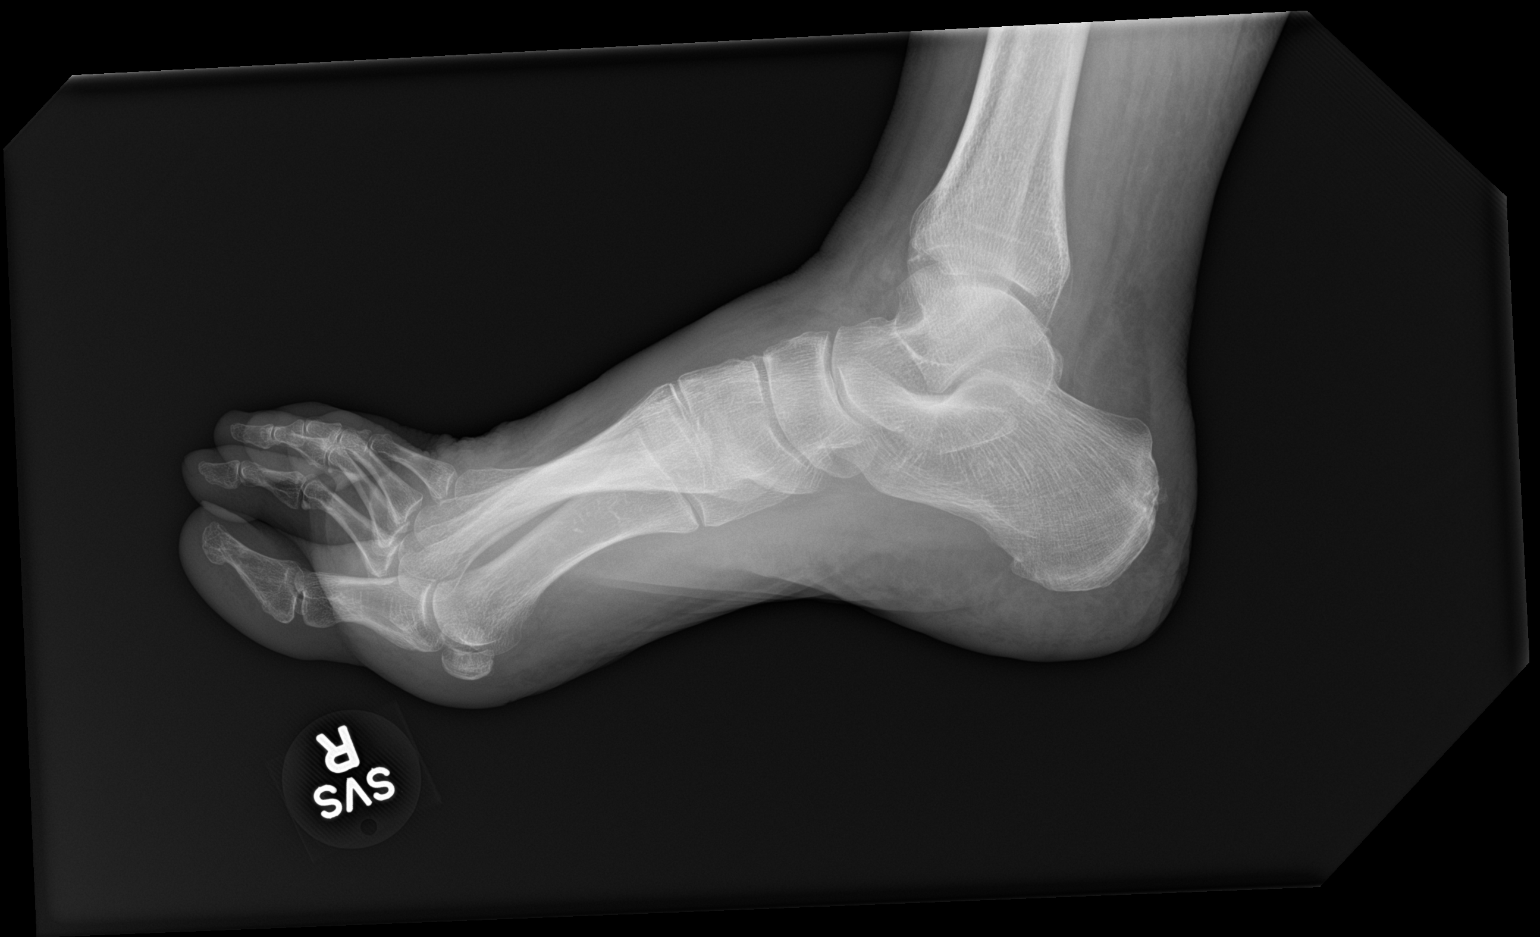

[2 of 2 positions shown; findings below may reference images not displayed]

FINDINGS: There is no evidence of fracture or dislocation. There is no
evidence of arthropathy or other focal bone abnormality. Soft
tissues are unremarkable.
IMPRESSION: Negative.

## 2021-09-06 IMAGING — DX DG FOOT 2V*L*
2 series · 2 of 2 positions shown · non-contrast
Comparison: None.

CLINICAL DATA: Bilateral foot pain

EXAM:
LEFT FOOT - 2 VIEW

[foot ap]
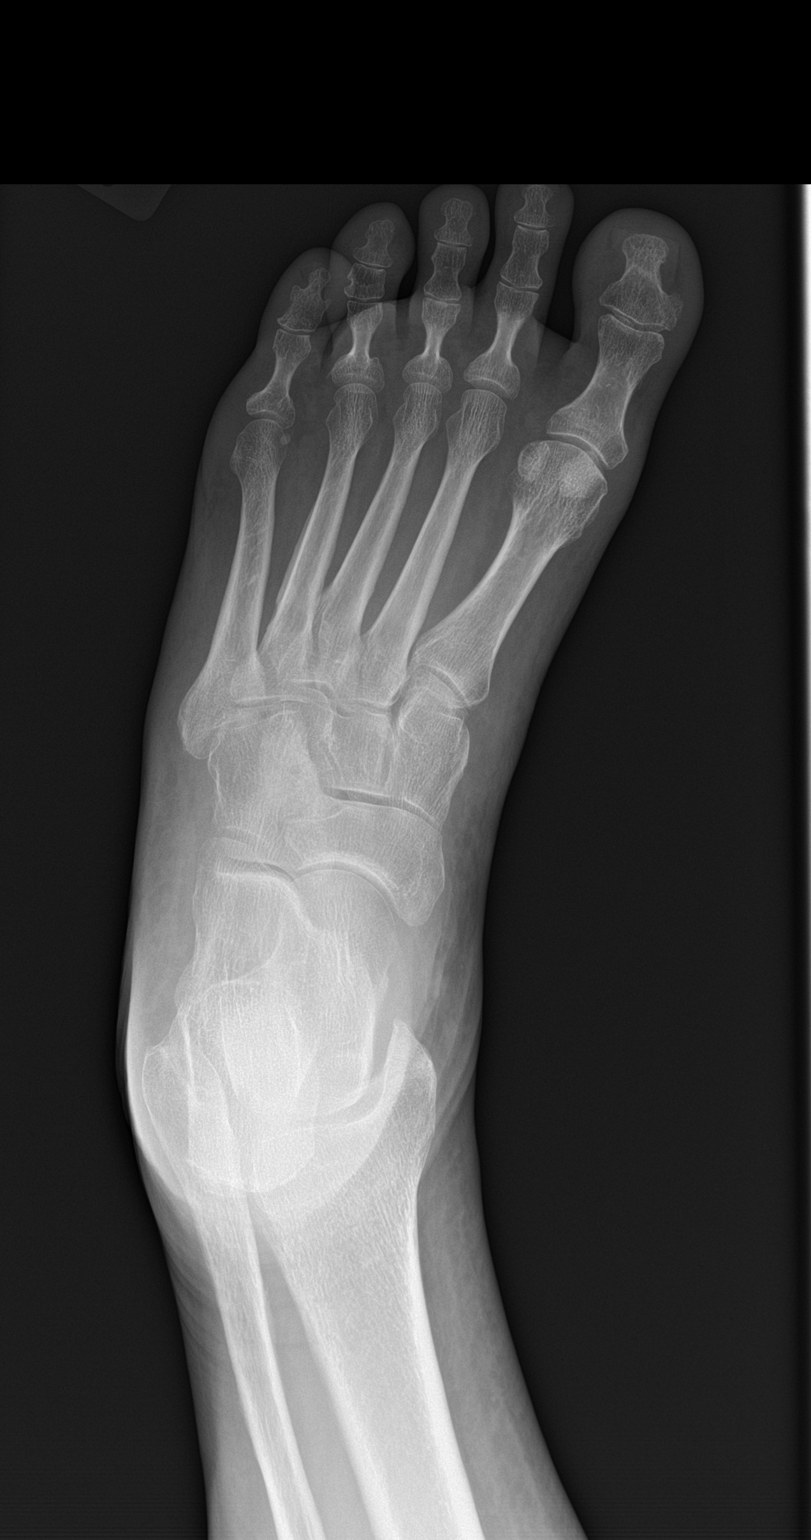

[foot lat]
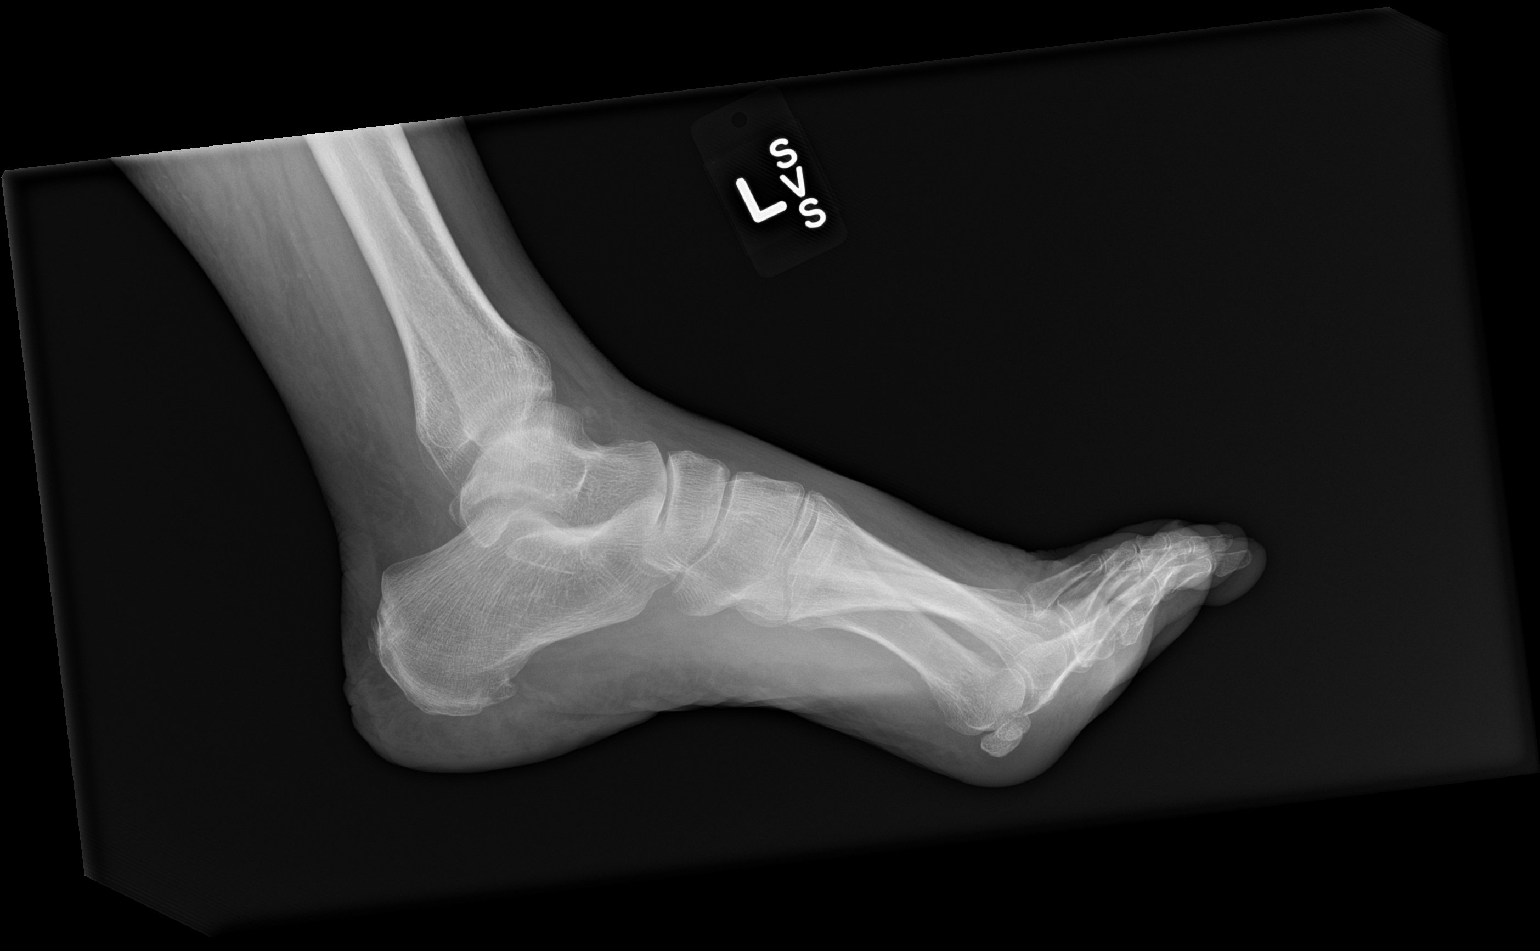

[2 of 2 positions shown; findings below may reference images not displayed]

FINDINGS: There is no evidence of fracture or dislocation. There is no
evidence of arthropathy or other focal bone abnormality. Soft
tissues are unremarkable.
IMPRESSION: Negative.

## 2021-09-06 MED ORDER — HYDROCODONE-ACETAMINOPHEN 5-325 MG PO TABS
1.0000 | ORAL_TABLET | Freq: Once | ORAL | Status: AC
  Administered 2021-09-06: 1 via ORAL
  Filled 2021-09-06: qty 1

## 2021-09-06 MED ORDER — ONDANSETRON 4 MG PO TBDP
4.0000 mg | ORAL_TABLET | Freq: Once | ORAL | Status: AC
Start: 2021-09-06 — End: 2021-09-06
  Administered 2021-09-06: 4 mg via ORAL
  Filled 2021-09-06: qty 1

## 2021-09-06 MED ORDER — HYDROCODONE-ACETAMINOPHEN 5-325 MG PO TABS: 1.0000 | ORAL_TABLET | Freq: Four times a day (QID) | ORAL | 0 refills | Status: AC | PRN

## 2021-09-06 MED ORDER — ONDANSETRON 4 MG PO TBDP: 4.0000 mg | ORAL_TABLET | Freq: Four times a day (QID) | ORAL | 0 refills | Status: DC | PRN

## 2021-09-06 NOTE — ED Provider Notes (Signed)
? ?[facility] ?Provider Note ? ? ? Event Date/Time  ? First MD Initiated Contact with Patient 09/06/21 0134   ?  (approximate) ? ? ?History  ? ?Leg Pain ? ? ?HPI ? ?Kaisa Wofford is a 66 y.o. female with history of fibromyalgia, depression, borderline personality disorder who presents to the emergency department with complaints of 4 months of bilateral foot pain worse after standing for long periods of time that she describes as an aching, burning.  No injury to her feet.  No calf tenderness or calf swelling.  No numbness.  No history of diabetes, alcohol abuse, neuropathy.  No back pain.  Has an appointment to see her primary care doctor in 2 days.  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  It appears patient is on gabapentin and Requip. ? ? ?History provided by patient. ? ? ? ?Past Medical History:  ?Diagnosis Date  ? Arthritis   ? Borderline personality disorder ([place])   ? Depression   ? Fibromyalgia   ? Shingles   ? ? ?Past Surgical History:  ?Procedure Laterality Date  ? CHOLECYSTECTOMY    ? COLONOSCOPY N/A 01/14/2021  ? Procedure: COLONOSCOPY;  Surgeon: [name], MD;  Location: [facility];  Service: Endoscopy;  Laterality: N/A;  positive home test 12-11-20  ? COLONOSCOPY WITH PROPOFOL N/A 01/01/2018  ? Procedure: COLONOSCOPY WITH PROPOFOL;  Surgeon: Lin Landsman, MD;  Location: [facility] ENDOSCOPY;  Service: Gastroenterology;  Laterality: N/A;  ? GASTRIC BYPASS    ? SHOULDER SURGERY    ? ? ?MEDICATIONS:  ?Prior to Admission medications   ?Medication Sig Start Date End Date Taking? Authorizing Provider  ?acetaminophen (TYLENOL) 325 MG tablet Take 2 tablets (650 mg total) by mouth every 6 (six) hours as needed for mild pain (or temp > 37.5 C (99.5 F)).  08/30/21   Loletha Grayer, MD  ?aspirin 81 MG chewable tablet Chew 1 tablet (81 mg total) by mouth daily. 08/30/21   Loletha Grayer, MD  ?doxepin (SINEQUAN) 10 MG capsule 10 MG capsule Take 20 mg by mouth nightly 09/18/19   [provider]  ?EPINEPHrine 0.3 mg/0.3 mL IJ SOAJ injection Inject into the muscle.    [provider]  ?gabapentin (NEURONTIN) 300 MG capsule Take by mouth. 08/27/21   [provider]  ?lamoTRIgine (LAMICTAL) 25 MG tablet Take 50 mg by mouth daily. 04/02/21   [provider]  ?losartan (COZAAR) 25 MG tablet Take 25 mg by mouth daily. 12/02/20   [provider]  ?meloxicam (MOBIC) 15 MG tablet Take 1 tablet (15 mg total) by mouth daily. 07/23/21   Victorino Dike, FNP  ?PROVENTIL HFA 108 (90 Base) MCG/ACT inhaler Inhale 2 puffs into the lungs every 4 (four) hours as needed. 08/09/21   [provider]  ?rOPINIRole (REQUIP) 1 MG tablet Take 1 mg by mouth in the morning and at bedtime.     [provider]  ? ? ?Physical Exam  ? ?Triage Vital Signs: ?ED Triage Vitals  ?Enc Vitals Group  ?   BP 09/06/21 0132 107/69  ?   Pulse Rate 09/06/21 0132 71  ?   Resp 09/06/21 0132 16  ?  Temp 09/06/21 0132 98.2 ?F (36.8 ?C)  ?   Temp Source 09/06/21 0132 Oral  ?   SpO2 09/06/21 0132 99 %  ?   Weight 09/06/21 0133 209 lb 7 oz (95 kg)  ?   Height 09/06/21 0133 '5\' 3"'$  (1.6 m)  ?   Head Circumference --   ?   Peak Flow --   ?   Pain Score 09/06/21 0133 10  ?   Pain Loc --   ?   Pain Edu? --   ?   Excl. in [place]? --   ? ? ?Most recent vital signs: ?Vitals:  ? 09/06/21 0132 09/06/21 0221  ?BP: 107/69 (!) 105/53  ?Pulse: 71 66  ?Resp: 16 16  ?Temp: 98.2 ?F (36.8 ?C)   ?SpO2: 99% 97%  ? ? ?CONSTITUTIONAL: Alert and oriented and responds appropriately to questions. Well-appearing; well-nourished ?HEAD: Normocephalic, atraumatic ?EYES: Conjunctivae clear, pupils appear equal, sclera nonicteric ?ENT: normal nose; moist mucous membranes ?NECK: Supple, normal  ROM ?CARD: RRR; S1 and S2 appreciated; no murmurs, no clicks, no rubs, no gallops ?RESP: Normal chest excursion without splinting or tachypnea; breath sounds clear and equal bilaterally; no wheezes, no rhonchi, no rales, no hypoxia or respiratory distress, speaking full sentences ?ABD/GI: Normal bowel sounds; non-distended; soft, non-tender, no rebound, no guarding, no peritoneal signs ?BACK: The back appears normal ?EXT: Tender over the lateral feet and plantar fascia insertion of the medial feet bilaterally.  There is no soft tissue swelling, redness, warmth, ecchymosis or deformity.  2+ DP pulses bilaterally.  She has no calf tenderness or calf swelling.  Compartments are soft.  Normal sensation in both legs.  Normal capillary refill.  No joint effusion. ?SKIN: Normal color for age and race; warm; no rash on exposed skin ?NEURO: Moves all extremities equally, normal speech ?PSYCH: The patient's mood and manner are appropriate. ? ? ?ED Results / Procedures / Treatments  ? ?LABS: ?(all labs ordered are listed, but only abnormal results are displayed) ?Labs Reviewed  ?CBC - Abnormal; Notable for the following components:  ?    Result Value  ? WBC 10.6 (*)   ? RBC 3.55 (*)   ? Hemoglobin 10.0 (*)   ? HCT 31.6 (*)   ? All other components within normal limits  ?BASIC METABOLIC PANEL - Abnormal; Notable for the following components:  ? Glucose, Bld 113 (*)   ? Creatinine, Ser 1.26 (*)   ? Calcium 8.4 (*)   ? GFR, Estimated 47 (*)   ? All other components within normal limits  ?MAGNESIUM  ?CK  ? ? ? ?EKG: ? ? ?RADIOLOGY: ?My personal review and interpretation of imaging: X-rays of both feet show no acute abnormality. ? ?I have personally reviewed all radiology reports.   ?DG Foot 2 Views Left ? ?Result Date: 09/06/2021 ?CLINICAL DATA:  Bilateral foot pain EXAM: LEFT FOOT - 2 VIEW COMPARISON:  None. FINDINGS: There is no evidence of fracture or dislocation. There is no evidence of arthropathy or other focal bone  abnormality. Soft tissues are unremarkable. IMPRESSION: Negative. Electronically Signed   By: Rolm Baptise M.D.   On: 09/06/2021 02:20  ? ?DG Foot 2 Views Right ? ?Result Date: 09/06/2021 ?CLINICAL DATA:  Bilateral foot pain.  No known injury. EXAM: RIGHT FOOT - 2 VIEW COMPARISON:  07/23/2021 FINDINGS: There is no evidence of fracture or dislocation. There is no evidence of arthropathy or other focal bone abnormality. Soft tissues are unremarkable. IMPRESSION: Negative. Electronically Signed  By: Rolm Baptise M.D.   On: 09/06/2021 02:18   ? ? ?PROCEDURES: ? ?Critical Care performed: No ? ? ? ? ?Procedures ? ? ? ?IMPRESSION / MDM / ASSESSMENT AND PLAN / ED COURSE  ?I reviewed the triage vital signs and the nursing notes. ? ? ? ?Patient here with bilateral foot pain ongoing for 4 months, worse with standing. ? ? ? ?DIFFERENTIAL DIAGNOSIS (includes but not limited to):   Tendinitis, plantar fasciitis, neuropathy, less less leg syndrome, muscle fatigue from prolonged standing, electrolyte derangement, rhabdomyolysis, doubt DVT, compartment syndrome, septic arthritis, gout, fracture ? ? ?PLAN: We will obtain labs to check electrolytes, CK level, creatinine.  Will obtain x-rays to rule out pathologic fracture or other bony abnormality.  Will give pain medication here.  She has no signs of infection on exam.  No sign of arterial obstruction or DVT.  Compartments are soft. ? ? ?MEDICATIONS GIVEN IN ED: ?Medications  ?HYDROcodone-acetaminophen (NORCO/VICODIN) 5-325 MG per tablet 1 tablet (1 tablet Oral Given 09/06/21 0204)  ?ondansetron (ZOFRAN-ODT) disintegrating tablet 4 mg (4 mg Oral Given 09/06/21 0205)  ? ? ? ?ED COURSE: X-rays reviewed by myself and radiologist and show no acute abnormality. ? ?Patient's labs show normal electrolytes.  Minimally elevated creatinine of 1.26.  Encouraged oral fluids and avoiding NSAIDs.  CK level normal.  She is mildly anemic which is chronic for her. ? ?Pain has significant improved after  Vicodin.  Given outpatient podiatry follow-up.  Will discharge with brief course of pain medication.  She has follow-up with her primary care doctor in 2 days.  We will provide with work note stating that she should be

## 2021-09-06 NOTE — Discharge Instructions (Signed)
You are being provided a prescription for opiates (also known as narcotics) for pain control.  Opiates can be addictive and should only be used when absolutely necessary for pain control when other alternatives do not work.  We recommend you only use them for the recommended amount of time and only as prescribed.  Please do not take with other sedative medications or alcohol.  Please do not drive, operate machinery, make important decisions while taking opiates.  Please note that these medications can be addictive and have high abuse potential.  Patients can become addicted to narcotics after only taking them for a few days.  Please keep these medications locked away from children, teenagers or any family members with history of substance abuse.  Narcotic pain medicine may also make you constipated.  You may use over-the-counter medications such as MiraLAX, Colace to prevent constipation.  If you become constipated, you may use over-the-counter enemas as needed.  Itching and nausea are also common side effects of narcotic pain medication.  If you develop uncontrolled vomiting or a rash, please stop these medications and seek medical care. ? ? ?Please continue your gabapentin and Requip as prescribed.  I recommend close follow-up with your primary care doctor and neurologist and I have also given you referral information for podiatry.  Please make sure that when you are standing for long periods of time that you are wearing good shoes with good foot and ankle support. ?

## 2021-09-06 NOTE — ED Triage Notes (Signed)
Pt states is having bilateral leg pain . Pt states she has been having leg pain for 3 weeks. Pt states she feels like every cell in her legs is screaming at her. Pt states pain is so bad she is having trouble walking.  ?

## 2021-09-09 ENCOUNTER — Ambulatory Visit: Payer: Medicare Other | Admitting: Podiatry

## 2021-09-09 ENCOUNTER — Ambulatory Visit (INDEPENDENT_AMBULATORY_CARE_PROVIDER_SITE_OTHER): Payer: Medicare Other

## 2021-09-09 DIAGNOSIS — M7750 Other enthesopathy of unspecified foot: Secondary | ICD-10-CM

## 2021-09-09 DIAGNOSIS — M7752 Other enthesopathy of left foot: Secondary | ICD-10-CM

## 2021-09-09 DIAGNOSIS — G5701 Lesion of sciatic nerve, right lower limb: Secondary | ICD-10-CM | POA: Diagnosis not present

## 2021-09-09 DIAGNOSIS — M7751 Other enthesopathy of right foot: Secondary | ICD-10-CM | POA: Diagnosis not present

## 2021-09-09 DIAGNOSIS — G5702 Lesion of sciatic nerve, left lower limb: Secondary | ICD-10-CM

## 2021-09-16 NOTE — Progress Notes (Signed)
?Subjective:  ?Patient ID: Ann Bowen, female    DOB: 09-14-1955,  MRN: [id number] ? ?Chief Complaint  ?Patient presents with  ? Foot Pain  ? ? ?66 y.o. female presents with the above complaint.  Patient presents with complaint of numbness tingling to both lower extremity.  Patient states that it has been going on for quite some time for past 3 months left is greater than right side.  She states worse after and while working 8 hours a day on her feet.  She is tried gabapentin and meloxicam none of which has helped.  She has not seen anyone else prior to seeing me.  She would like to discuss treatment options.  She does not have any neurological history that she is aware of. ? ? ?Review of Systems: Negative except as noted in the HPI. Denies N/V/F/Ch. ? ?Past Medical History:  ?Diagnosis Date  ? Arthritis   ? Borderline personality disorder ([place])   ? Depression   ? Fibromyalgia   ? Shingles   ? ? ?Current Outpatient Medications:  ?  acetaminophen (TYLENOL) 325 MG tablet, Take 2 tablets (650 mg total) by mouth every 6 (six) hours as needed for mild pain (or temp > 37.5 C (99.5 F))., Disp: , Rfl:  ?  aspirin 81 MG chewable tablet, Chew 1 tablet (81 mg total) by mouth daily., Disp: 30 tablet, Rfl: 0 ?  doxepin (SINEQUAN) 10 MG capsule, 10 MG capsule Take 20 mg by mouth nightly, Disp: , Rfl:  ?  EPINEPHrine 0.3 mg/0.3 mL IJ SOAJ injection, Inject into the muscle., Disp: , Rfl:  ?  gabapentin (NEURONTIN) 300 MG capsule, Take by mouth., Disp: , Rfl:  ?  HYDROcodone-acetaminophen (NORCO/VICODIN) 5-325 MG tablet, Take 1 tablet by mouth every 6 (six) hours as needed for moderate pain., Disp: 10 tablet, Rfl: 0 ?  lamoTRIgine (LAMICTAL) 25 MG tablet, Take 50 mg by mouth daily., Disp: , Rfl:  ?  losartan (COZAAR) 25 MG tablet, Take 25 mg by mouth daily., Disp: , Rfl:  ?  meloxicam (MOBIC) 15 MG tablet, Take 1 tablet (15 mg total) by mouth daily., Disp: 30 tablet, Rfl: 0 ?  ondansetron (ZOFRAN-ODT) 4 MG disintegrating tablet,  Take 1 tablet (4 mg total) by mouth every 6 (six) hours as needed for nausea or vomiting., Disp: 10 tablet, Rfl: 0 ?  PROVENTIL HFA 108 (90 Base) MCG/ACT inhaler, Inhale 2 puffs into the lungs every 4 (four) hours as needed., Disp: , Rfl:  ?  rOPINIRole (REQUIP) 1 MG tablet, Take 1 mg by mouth in the morning and at bedtime. , Disp: , Rfl:  ? ?Social History  ? ?Tobacco Use  ?Smoking Status Never  ?Smokeless Tobacco Never  ? ? ?Allergies  ?Allergen Reactions  ? Wellbutrin [Bupropion] Hives and Rash  ? ?Objective:  ?There were no vitals filed for this visit. ?There is no height or weight on file to calculate BMI. ?Constitutional Well developed. ?Well nourished.  ?Vascular Dorsalis pedis pulses palpable bilaterally. ?Posterior tibial pulses palpable bilaterally. ?Capillary refill normal to all digits.  ?No cyanosis or clubbing noted. ?Pedal hair growth normal.  ?Neurologic Normal speech. ?Oriented to person, place, and time. ?Positive Tinel sign noted to both common peroneal nerves around the fibular head.  No Tinel's sign at the tarsal tunnel.  ?Dermatologic Nails within normal limits ?Skin within normal limits  ?Orthopedic: Normal joint ROM without pain or crepitus bilaterally. ?No visible deformities. ?No bony tenderness.  ? ?Radiographs: 3 views of skeletally mature  adult bilateral foot: Pes cavus foot structure noted.Posterior plantar heel spurring noted.  Mild midfoot arthritis noted.  No other bony abnormalities identified. ?Assessment:  ? ?1. Tendinitis of foot   ?2. Compression of common peroneal nerve, right   ?3. Compression of common peroneal nerve, left   ? ?Plan:  ?Patient was evaluated and treated and all questions answered. ? ?Bilateral common peroneal nerve compression ?-All questions and concerns were discussed with the patient in extensive detail.  She has positive Tinel's sign with shooting tingling upon palpating the nerve around the fibular head.  I discussed with her that there may be compression  of the nerve around the fibular head.  I discussed with her the importance of obtaining nerve conduction study to address this completely.  She may eventually benefit from nerve compression release.  I discussed with the patient she states understanding. ? ?No follow-ups on file. ?

## 2021-10-01 ENCOUNTER — Emergency Department
Admission: EM | Admit: 2021-10-01 | Discharge: 2021-10-01 | Disposition: A | Discharge status: Home / Self Care | Payer: Medicare Other | Attending: Emergency Medicine | Admitting: Emergency Medicine

## 2021-10-01 ENCOUNTER — Other Ambulatory Visit: Payer: Self-pay

## 2021-10-01 ENCOUNTER — Encounter: Payer: Self-pay | Admitting: Emergency Medicine

## 2021-10-01 ENCOUNTER — Emergency Department: Payer: Medicare Other

## 2021-10-01 DIAGNOSIS — R410 Disorientation, unspecified: Secondary | ICD-10-CM | POA: Diagnosis not present

## 2021-10-01 DIAGNOSIS — R404 Transient alteration of awareness: Secondary | ICD-10-CM | POA: Diagnosis not present

## 2021-10-01 DIAGNOSIS — D649 Anemia, unspecified: Secondary | ICD-10-CM | POA: Diagnosis not present

## 2021-10-01 DIAGNOSIS — R4182 Altered mental status, unspecified: Secondary | ICD-10-CM | POA: Diagnosis present

## 2021-10-01 LAB — COMPREHENSIVE METABOLIC PANEL
ALT: 14 U/L (ref 0–44)
AST: 20 U/L (ref 15–41)
Albumin: 3.4 g/dL — ABNORMAL LOW (ref 3.5–5.0)
Alkaline Phosphatase: 82 U/L (ref 38–126)
Anion gap: 7 (ref 5–15)
BUN: 12 mg/dL (ref 8–23)
CO2: 24 mmol/L (ref 22–32)
Calcium: 8.5 mg/dL — ABNORMAL LOW (ref 8.9–10.3)
Chloride: 108 mmol/L (ref 98–111)
Creatinine, Ser: 0.79 mg/dL (ref 0.44–1.00)
GFR, Estimated: >60 mL/min (ref >60)
Glucose, Bld: 104 mg/dL — ABNORMAL HIGH (ref 70–99)
Potassium: 3.5 mmol/L (ref 3.5–5.1)
Sodium: 139 mmol/L (ref 135–145)
Total Bilirubin: 0.5 mg/dL (ref 0.3–1.2)
Total Protein: 6.8 g/dL (ref 6.5–8.1)

## 2021-10-01 LAB — CBC
HCT: 33.8 % — ABNORMAL LOW (ref 36.0–46.0)
Hemoglobin: 11.2 g/dL — ABNORMAL LOW (ref 12.0–15.0)
MCH: 28.3 pg (ref 26.0–34.0)
MCHC: 33.1 g/dL (ref 30.0–36.0)
MCV: 85.4 fL (ref 80.0–100.0)
Platelets: 249 10*3/uL (ref 150–400)
RBC: 3.96 MIL/uL (ref 3.87–5.11)
RDW: 13.2 % (ref 11.5–15.5)
WBC: 5.7 10*3/uL (ref 4.0–10.5)
nRBC: 0 % (ref 0.0–0.2)

## 2021-10-01 LAB — CK: Total CK: 34 U/L — ABNORMAL LOW (ref 38–234)

## 2021-10-01 LAB — CBG MONITORING, ED: Glucose-Capillary: 102 mg/dL — ABNORMAL HIGH (ref 70–99)

## 2021-10-01 IMAGING — CT CT HEAD W/O CM
4 series · 17 of 47 positions shown, 19 images · non-contrast
Comparison: [DATE]

CLINICAL DATA: Memory loss.



[Series 2: head wo · axial · 0.39mm/px · z∈[-116,+18]mm · 7 of 37 slices shown, 9 images]
[im 5/37  brain]
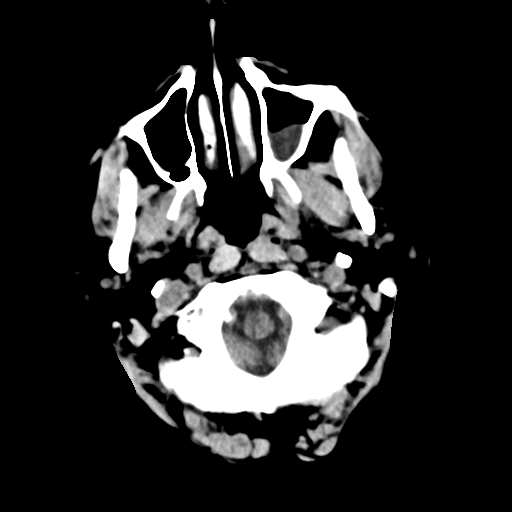
[im 5/37  bone]
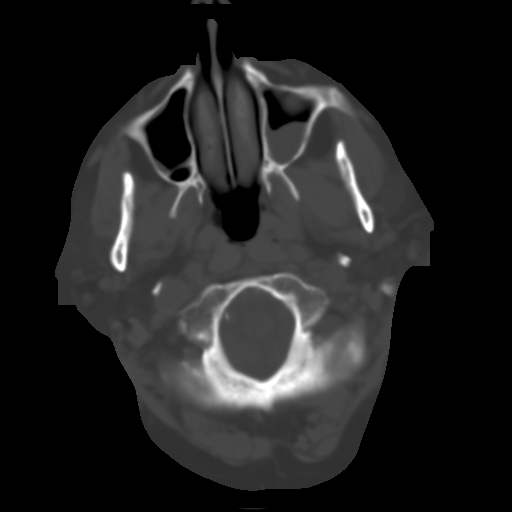
[im 10/37  brain]
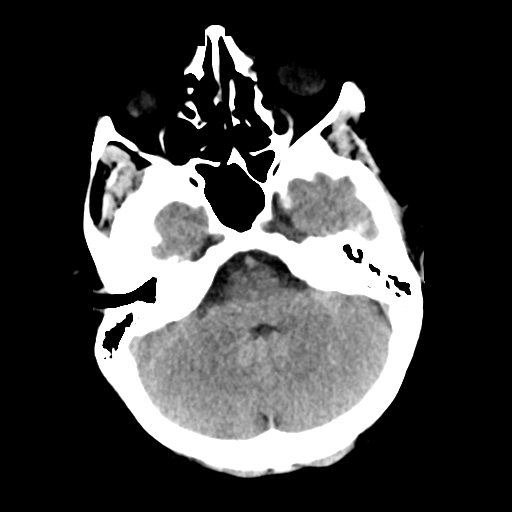
[im 14/37  brain]
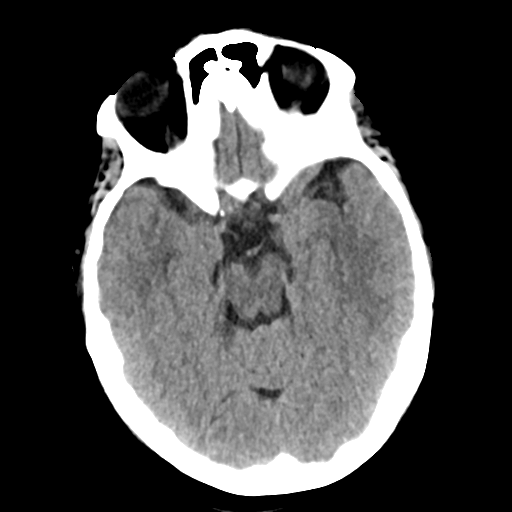
[im 19/37  brain]
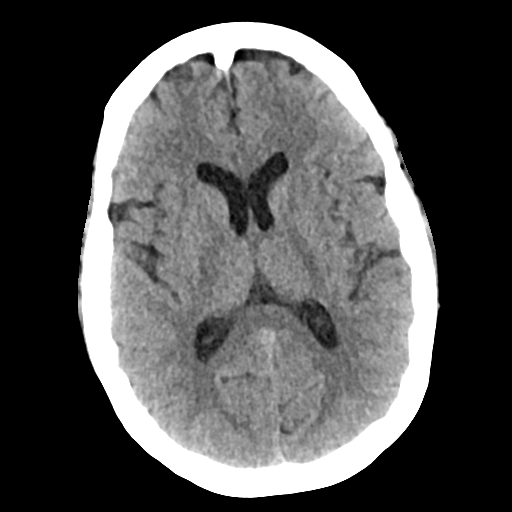
[im 23/37  brain]
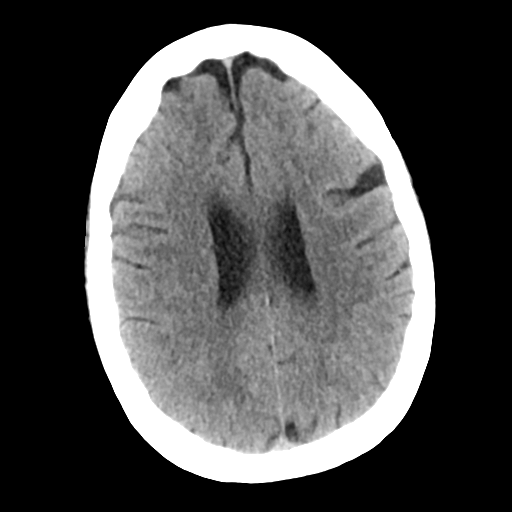
[im 23/37  bone]
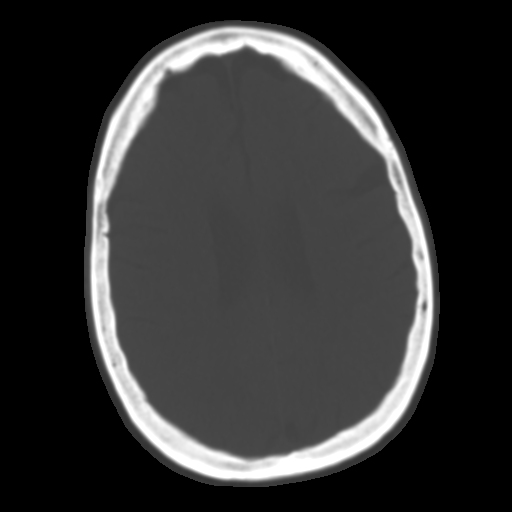
[im 28/37  brain]
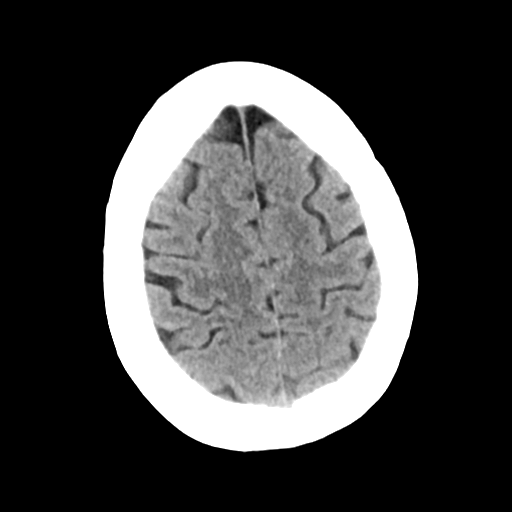
[im 32/37  brain]
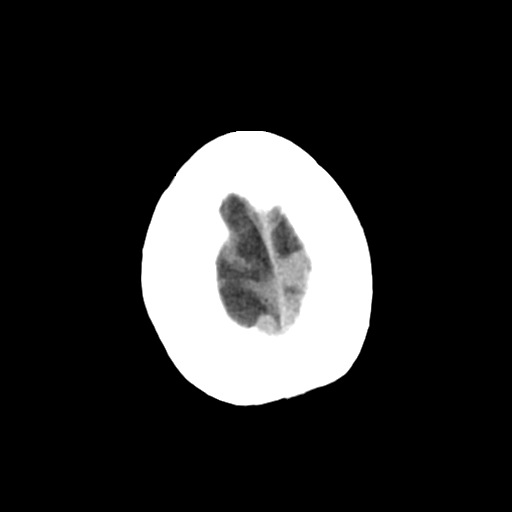

[Series 3: head bone · axial · 0.39mm/px · z∈[-118,-56]mm · 4 of 92 slices shown]
[im 10/92  bone]
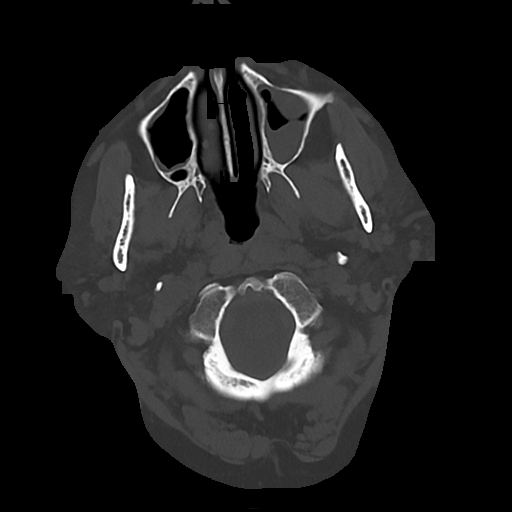
[im 19/92  bone]
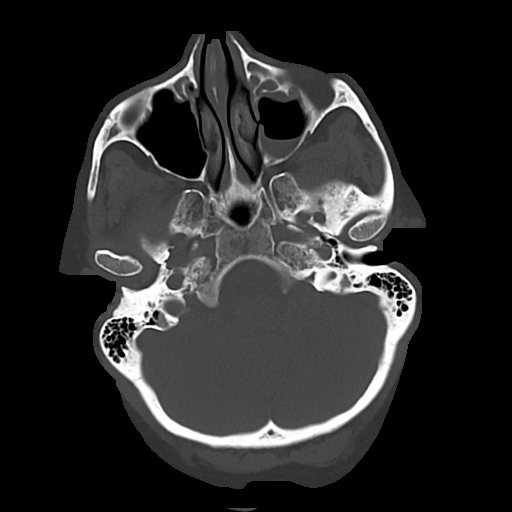
[im 28/92  bone]
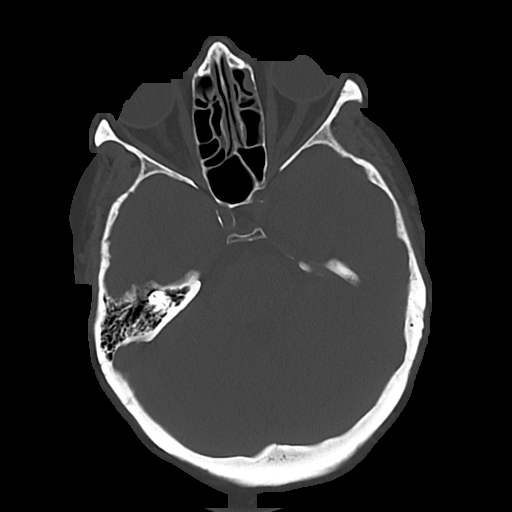
[im 41/92  bone]
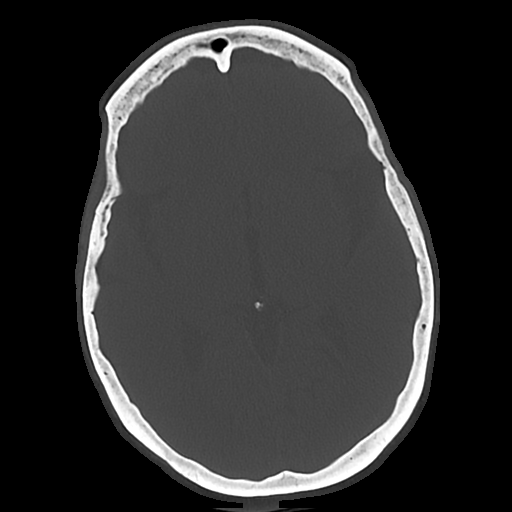

[Series 4: cor soft · coronal · 0.37mm/px · 3 of 68 slices shown]
[im 23/68  brain]
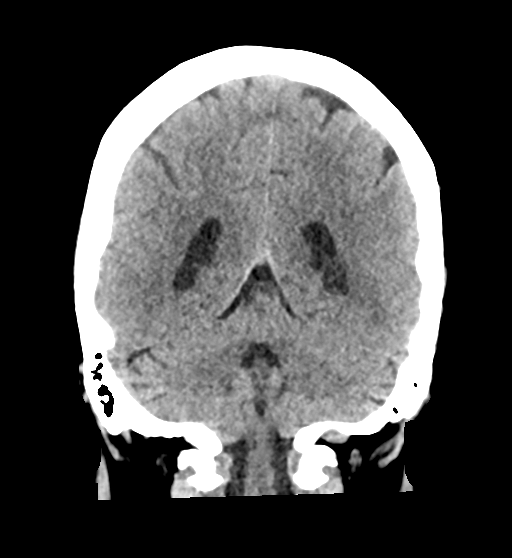
[im 30/68  brain]
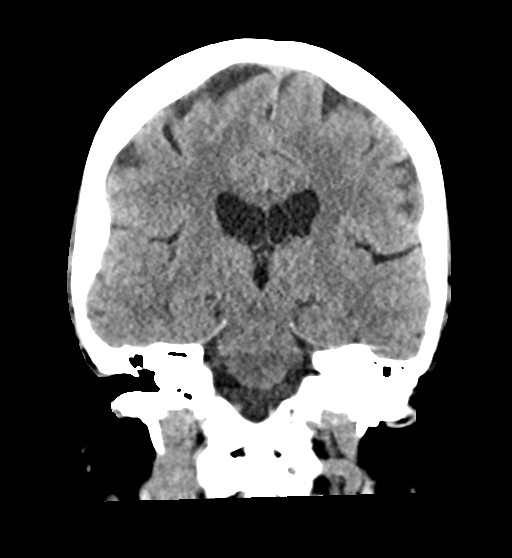
[im 38/68  brain]
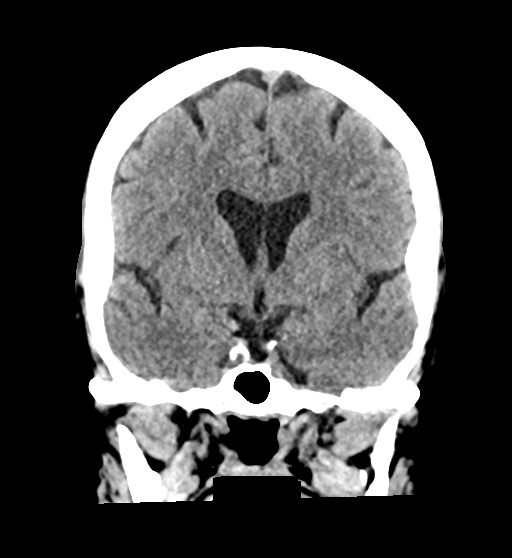

[Series 5: sag soft · sagittal · 0.39mm/px · 3 of 56 slices shown]
[im 19/56  brain]
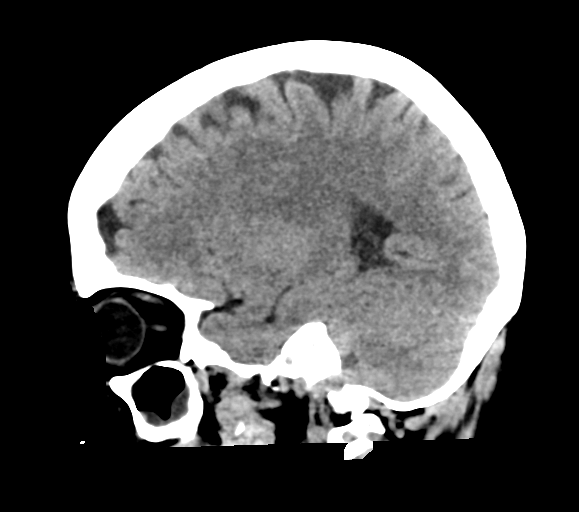
[im 28/56  brain]
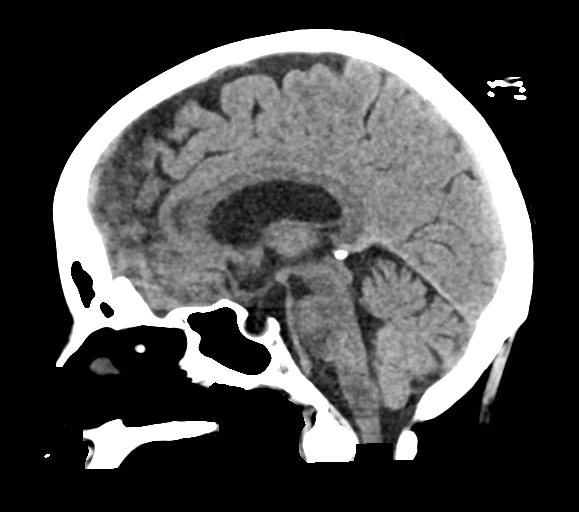
[im 37/56  brain]
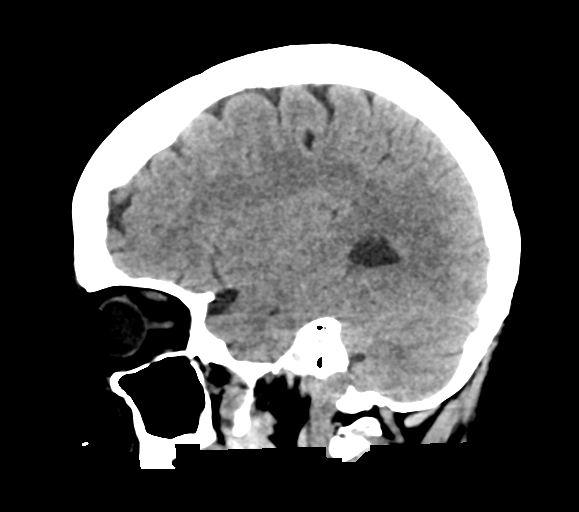

[17 of 47 positions shown; findings below may reference images not displayed]

FINDINGS: Brain: No evidence of acute infarction, hemorrhage, hydrocephalus,
extra-axial collection or mass lesion/mass effect.

Vascular: No hyperdense vessel or unexpected calcification.

Skull: Normal. Negative for fracture or focal lesion.

Sinuses/Orbits: No acute finding. Orbits are normal. Minimal chronic
inflammatory change of the sinuses.

Other: None.
IMPRESSION: No acute findings.

## 2021-10-01 NOTE — ED Triage Notes (Addendum)
Pt via POV from home. Pt c/o altered mental status and confusion. Per pt, pt was in the car about to drive. Pt had a sudden onset of "not being able to move my whole body" and "seeing pages of words and pages in a book, it seemed like it was scrolling". States they did not feel like hallucinations. States that episode lasted seconds. States now she is weak and unable to move and muscles feel rigid. Pt states that prior to this episode she felt fine. Pt does see a neurologist but never for this. Denies any new pain but states she does have some blurred vision. Pt is A&OX4 and NAD

## 2021-10-01 NOTE — ED Notes (Signed)
Edp examining pt. Pt states had "second long" incident at 0615 today where could not move whole body and felt confused. Taking abx for strep throat day 3 of tx at thist ime. Hx possible TIA currently seeing neurologist.

## 2021-10-01 NOTE — Discharge Instructions (Signed)
Follow-up for the EEG and a return visit with Dr. Melrose Nakayama as scheduled.  You should not drive until you are reevaluated by neurology to rule out a seizure.  Continue taking your normal medications as prescribed.    Return to the ER for new, worsening, or persistent episodes of change in your mental status or awareness, vision changes, disturbance in your speech, weakness or numbness or any strokelike symptoms, seeing or hearing things that are not there, seizure activity or convulsions, or any other new or worsening symptoms that concern you.

## 2021-10-01 NOTE — ED Notes (Signed)
Pt sleeping. Husband at bedside. SB on monitor.

## 2021-10-01 NOTE — ED Provider Notes (Signed)
[facility] Provider Note    Event Date/Time   First MD Initiated Contact with Patient 10/01/21 [phone]     (approximate)   History   Altered Mental Status and Weakness   HPI  Ann Bowen is a 66 y.o. female with a history fibromyalgia, depression, and borderline personality disorder who presents with an episode of change in her mental status and generalized weakness.  The patient states that she was in her car this morning about to drive when she suddenly felt like she could not move her body, felt confusion, and had a sensation as if there were pages or newsprint scrolling through her vision.  This lasted for just a few seconds, however since that time the patient has felt generalized weakness throughout her body with increased difficulty moving her extremities on both sides.  She feels like she wants to "melt into" the stretcher.  She denies any associated vision changes, numbness, speech disturbance, chest pain, difficulty breathing, headache, nausea, or vomiting.  The patient states that she has had spells of transient alteration in consciousness several times over the last few months including one that she was admitted for last month.  She states that this episode was similar and that she did not actually lose consciousness and it only lasted a few seconds, however the generalized weakness afterwards is new to her.  The patient states that she saw a neurologist earlier this month and is scheduled for an EEG but has not had it yet.  She denies any recent medication changes.  There was some concern previously that tizanidine might be related to the spells but the patient was advised by neurology that she can continue taking it.    Physical Exam   Triage Vital Signs: ED Triage Vitals  Enc Vitals Group     BP 10/01/21 0723 (!) 171/156     Pulse Rate 10/01/21 0723 (!) 50     Resp 10/01/21 0723 20     Temp 10/01/21 0723 98.1 F (36.7 C)     Temp Source  10/01/21 0723 Oral     SpO2 10/01/21 0723 96 %     Weight 10/01/21 0722 205 lb (93 kg)     Height 10/01/21 0722 '5\' 3"'$  (1.6 m)     Head Circumference --      Peak Flow --      Pain Score 10/01/21 0721 0     Pain Loc --      Pain Edu? --      Excl. in [place]? --     Most recent vital signs: Vitals:   10/01/21 0900 10/01/21 0930  BP: (!) 130/50 (!) 124/59  Pulse: (!) 52 (!) 47  Resp: (!) 21 14  Temp:    SpO2: 97% 94%     General: Awake, no distress.  CV:  Good peripheral perfusion.  Resp:  Normal effort.  Abd:  No distention.  Other:  EOMI.  PERRLA.  Cranial nerves III through XII grossly intact.  No facial droop.  5/5 motor strength and intact sensation of bilateral upper and lower extremities.  No ataxia on finger-to-nose.  No pronator drift.   ED Results / Procedures / Treatments   Labs (all labs ordered are listed, but only abnormal results are displayed) Labs Reviewed  COMPREHENSIVE METABOLIC PANEL - Abnormal; Notable for the following components:      Result Value   Glucose, Bld 104 (*)    Calcium 8.5 (*)    Albumin  3.4 (*)    All other components within normal limits  CBC - Abnormal; Notable for the following components:   Hemoglobin 11.2 (*)    HCT 33.8 (*)    All other components within normal limits  CK - Abnormal; Notable for the following components:   Total CK 34 (*)    All other components within normal limits  CBG MONITORING, ED - Abnormal; Notable for the following components:   Glucose-Capillary 102 (*)    All other components within normal limits     EKG  ED ECG REPORT I, Arta Silence, the attending physician, personally viewed and interpreted this ECG.  Date: 10/01/2021 EKG Time: 0725 Rate: 51 Rhythm: normal sinus rhythm QRS Axis: normal Intervals: normal ST/T Wave abnormalities: normal Narrative Interpretation: no evidence of acute ischemia; no significant change when compared to EKG of 08/28/2021    RADIOLOGY  CT head: I  independently viewed and interpreted the images; there is no ICH, mass, or other acute abnormality.  I also reviewed the radiology report which confirmed no acute findings.  PROCEDURES:  Critical Care performed: No  Procedures   MEDICATIONS ORDERED IN ED: Medications - No data to display   IMPRESSION / MDM / [place] / ED COURSE  I reviewed the triage vital signs and the nursing notes.  66 year old female with PMH as noted above presents with an episode of transient alteration of mental status with no loss of consciousness, and now reports generalized weakness.  I reviewed the past medical records.  Per the hospitalist discharge summary from 08/30/2021, the patient was admitted at that time with a similar episode of transient altered mental status and confusion.  She had a stroke work-up including MRI and MRA of the head and neck which were negative.  She was evaluated by neurology.  There was some suspicion that her symptoms could be related to tizanidine but she has not discontinued it.  She subsequently saw Dr. Melrose Nakayama from neurology on 5/4 and is scheduled for an EEG but has not had it yet.  On exam the patient is well-appearing.  Her vital signs are normal except for hypertension.  The physical exam is otherwise unremarkable.  The patient reports generalized weakness especially in her extremities but has 5/5 motor strength in all extremities and neurologic exam is otherwise normal.  Differential diagnosis includes, but is not limited to, seizure, transient global amnesia, medication side effect, or less likely TIA, CVA or other acute neurologic cause.  Differential also includes electrolyte abnormality or other metabolic cause.  We will obtain CT head, lab work-up, and reassess.  The patient is on the cardiac monitor to evaluate for evidence of arrhythmia and/or significant heart rate changes.  ----------------------------------------- 10:36 AM on  10/01/2021 -----------------------------------------  CT head is negative for acute findings.  Lab work-up is unremarkable; CK is not elevated, the patient has mild anemia unchanged from prior, and normal electrolytes.  On reassessment, the patient is feeling better.  She was able to get up and walk without difficulty and without any lightheadedness, weakness, or other symptoms.  She feels well to go home.  I consulted Dr. Leonel Ramsay from neurology and discussed the case with him.  He agrees with the current work-up and management.  He does not recommend repeat MRI or other emergent evaluation.  He recommends that the patient continue with the plan of EEG as an outpatient this month and follow-up with Dr. Melrose Nakayama as scheduled.  However, he further advises that the patient  should not drive until she is reevaluated and the source of these episodes can be determined.  I discussed the results of the work-up, neurology recommendations, and the plan of care with the patient and she is in agreement.  I gave her very thorough return precautions and she expressed understanding.  She is stable for discharge at this time.   FINAL CLINICAL IMPRESSION(S) / ED DIAGNOSES   Final diagnoses:  Transient alteration of awareness     Rx / DC Orders   ED Discharge Orders     None        Note:  This document was prepared using Dragon voice recognition software and may include unintentional dictation errors.    Arta Silence, MD 10/01/21 1037

## 2021-10-01 NOTE — ED Notes (Signed)
Pt walked around in room with no balance issues. Steady gait noted.

## 2022-05-11 ENCOUNTER — Emergency Department
Admission: EM | Admit: 2022-05-11 | Discharge: 2022-05-11 | Discharge status: Against Medical Advice | Payer: Medicare Other

## 2022-05-12 NOTE — H&P (Signed)
 ------------------------------------------------------------------------------- Attestation with edits by Ropero-Cartier, Ozell SQUIBB, MD at 05/13/22 [phone] Admission Attestation: I saw and evaluated the patient, participating in the key portions of the service.  I reviewed the resident's note.  I agree with the resident's findings and plan.  I personally spent 55 minutes face-to-face and non-face-to-face in the care of this patient, which includes all pre, intra, and post visit time on the date of service. All documented time was specific to E/M and does not include any procedures that may have been performed.    Ozell SQUIBB Mealy, MD    -------------------------------------------------------------------------------   Family Medicine Inpatient Service History and Physical Note  Team: [facility] [place] (pgr [phone]) PCP: Adina Buel Jansky, MD Date of Admission: May 12, 2022 Code Status: full code Emergency Contact: Godwin,[phone]   ASSESSMENT / PLAN:  Ann Bowen is a 66 y.o. female with past medical history of fibromyalgia, depression, borderline personality disorder, bipolar 1 disorder, insomnia, gastric bypass and chronic pain who presents with 1 week of flulike symptoms.  # Complicated UTI 3 days of urinary urgency. UA with large leuk esterase, positive nitrite consistent with UTI.  No CVA tenderness on exam or flank pain making nephrolithiasis/pyelonephritis less likely.1L NS bolus given in ED. Tolerating po fluids.  -Ceftriaxone  1g q24hr -Cultures pending, adjust treatment as indicated.   # Influenza+ 1 week of flu like symptoms. Since requiring hospitalization will treat with Tamiflu. CXR neg for acute process.  - Tamiflu BID -Monitor O2 sats   # Bradycardia  Likely multifactorial including hypothyroidism, acute illness in the setting of influenza.  Patient also on bradycardic medication doxepin .  EKG with no evidence of AV block or other bradycardia  arrhythmias. -Admit with tele   # Insomnia  Chronic. On home doxepin  -Hold in the setting of bradycardia.  - Started on hydroxyzine  25 mg nightly.   # Neutropenia  WBC- 2.7. Likely from UTI and influenza. She also received 2L of IVF(1L outpaitent and 1L in the ED) which could have dilution effect. Also considered malignancy. Has never been neutropenic previously even in setting of infection.   -repeat CBC outpatient in 6 weeks absence of illness   #hypothyroid  -repeat TSH in 6 weeks   #fibromyalgia, depression, and borderline personality disorder, insomnia -Pharm rec in AM -Cont ropinirole , tizanadine.   #Gastric bypass  -Avoid nsaids   # FEN/GI: - IVF None - Check electrolytes as indicated, replete as needed. - Diet Regular  # PPX:  - DVT: SQ Lovenox   # Dispo: Floor [ ]  Anticipated Discharge Location: Home [ ]  PT/OT/DME: No needs anticipated [ ]  CM/SW needs: None anticipated [ ]  Teaching: None anticipated  HISTORY OF PRESENT ILLNESS: Ann Bowen is a 66 y.o. female with past medical history of fibromyalgia, depression, borderline personality disorder, bipolar 1 disorder, insomnia, gastric bypass and chronic pain who presents with 1 week of flulike symptoms.  Symptoms include generalized bodyaches, cough, sore throat, fatigue which started on Saturday 12/23.  She was seen at her PCPs office this morning and tested positive influenza A and was hypotensive with blood pressure of 82/46.  She received 1 L of IV fluids at her PCPs and blood pressure responded to 95/80.  PCP then recommended patient go to the ED.  Here she is also noted to have 3 days of urinary urgency starting on Monday with 1 episode of urinary incontinence on Wednesday.  Also reported 2 episodes of nonbilious nonbloody emesis on Sunday night.  Does report bilateral back that she describes  as a soreness.  No abdominal pain.  No SOB or CP.  No headache or vision changes. No hematochezia or hematuria.   Of note patient  had similar episode on 12/23/20 with covid infection and found to be bradycardiac hr 40s and bp 75/57 . ED Course:  1L at PCP  1L ns  1 g cftx   COVID-19 Testing on Admission: Symptomatic & Negative  PAST MEDICAL / SURGICAL HX: Past Medical History:  Diagnosis Date  . Anxiety    No past surgical history on file.  FAMILY HX:  History reviewed. No pertinent family history.  SOCIAL HX:  Social History   Socioeconomic History  . Marital status: Married    Spouse name: None  . Number of children: None  . Years of education: None  . Highest education level: None  Tobacco Use  . Smoking status: Never    Passive exposure: Current  . Smokeless tobacco: Never  Vaping Use  . Vaping Use: Never used    MEDICATIONS / ALLERGIES: No medications prior to admission.    Allergies  Allergen Reactions  . Bupropion  Hives and Rash    IMMUNIZATIONS: Immunization History  Administered Date(s) Administered  . COVID-19 VACC,MRNA,(PFIZER)(PF) 08/27/2019    REVIEW OF SYSTEMS: Pertinent positives and negatives per HPI. A complete review of systems otherwise negative.  PHYSICAL EXAM:  Initial ED Vitals:  ED Triage Vitals  Enc Vitals Group     BP 05/12/22 1426 91/55     Heart Rate 05/12/22 1426 56     SpO2 Pulse 05/12/22 1900 (!) 48     Resp 05/12/22 1426 16     Temp 05/12/22 1426 36.5 C (97.7 F)     Temp Source 05/12/22 1426 Oral     SpO2 05/12/22 1426 100 %     Weight 05/12/22 1426 92.5 kg (203 lb 14.4 oz)     Height --      Head Circumference --      Peak Flow --      Pain Score --      Pain Loc --      Pain Edu? --      Excl. in [place]? --     Recent Vitals: Vitals:   05/13/22 0420  BP:   Pulse: 55  Resp:   Temp:   SpO2:     GEN: Well-appearing, lying in bed, NAD  Eyes: PERRL. No scleral icterus. Conjunctiva non-erythematous. EOMI. HEENT: NCAT, MMM. Oropharynx clear. Neck: Supple. Lymphadenopathy: mildly tender anterior cervical lymphadenopathy.  CV: Regular  rate and rhythm. No murmurs/rubs/gallops. No costochondral tenderness. No cyanosis or clubbing. Cap Refill < 2 secs Pulm: CTAB. No wheezing, crackles, or rhonchi. Abd: Flat.  Nontender. No guarding, rebound.  Normoactive bowel sounds.   Neuro: A&O x 3. No focal deficits. Strength 5/5 UE/LE. Distal sensation to light touch intact. Ext: trace edema edema.  Palpable distal pulses. Skin: No rashes or skin lesions.  Sacrum examined and    LABS/ STUDIES: All imaging, laboratory studies, and other pertinent tests including electrocardiography were reviewed prior to admission and are summarized within the assessment and plan.    [place] T Holmes, DO, DO PGY1 May 12, 2022 7:51 PM

## 2022-05-14 NOTE — Discharge Summary (Signed)
 ------------------------------------------------------------------------------- Attestation signed by Claudene Burnard Cable, MD at 05/14/22 1357 Discharge Attestation: I saw and evaluated the patient, participating in the key portions of the service on the day of discharge.  I reviewed the resident's note and agree with the discharge plans and disposition. I personally spent 35 minutes in discharge planning services.    Burnard Cable Claudene, MD    -------------------------------------------------------------------------------   Physician Discharge Summary [facility] 2 BT1 [facility] [address] [place] [address] Dept: [phone] Loc: [phone]   Identifying Information:  Daris Aristizabal July 08, 1955 [id number]  Primary Care Physician: Adina Buel Jansky, MD  Code Status: Full Code  Admit Date: 05/12/2022  Discharge Date: 05/14/2022   Discharge To: Home  Discharge Service: [facility] - FAM - [place]   Discharge Attending Physician: Burnard Cable Claudene, MD  Discharge Diagnoses: Active Problems:   * No active hospital problems. * Resolved Problems:   * No resolved hospital problems. *   Outpatient Provider Follow Up Issues: See below Hospital Course:  PCP Follow-Up Items: [ ]  Repeat TSH and CBC in Feb (06/24/22)  [ ]  Medication review and consider adjusting bradycardia inducing medications [ ]  Completed 5 day course of Macrobid?  #Bradycardia Patient was admitted from PCP with hypotension and bradycardia. She was admitted to the ED and received another 1 L NS which improved BP but bradycardia persisted. EKG showed sinus bradycardia without evidence of AV block. Her troponin trend was 7 -> 10 -> 401; cardiology was consulted. There was concern for myocarditis in setting of +Influenza infection and POCUS ED showing trace pericardial effusion, however patient was not endorsing any symptoms associated with myocarditis. Repeat EKG showed NSR with rate of 63. Patient never endorsed any chest  pain, vomiting, numbness/weakness in extremities so ACS was deemed unlikely. Cardiology determined elevated troponin was likely secondary to demand ischemia and stated they would see the patient in outpatient setting. Her low HR is more likely due to medications (Requip , Zanaflex  are known to cause marked bradycardia). Patient remained stable and never required intervention for bradycardia. She was discharged on 05/14/22.  #Acute cystitis 3 days of urinary urgency. UA with large leuk esterase, positive nitrite consistent with UTI. No fever or CVA tenderness on exam or flank pain making nephrolithiasis/pyelonephritis less likely. 1L NS bolus and 1 dose of CTX given in ED. Urine cx growing E/ coli >100,000 CFU and antibiotics were switched to Macrobid 100 mg BID. Patient was no longer endorsing urinary symptoms after 2 days of treatment. She was discharged on Macrobid t complete 4 day course (end 05/16/22).  #Hypokalemia Patient had a K+ of 2.8 on 12/30. BMP was checked after repletion and K was 4.1.   #Neutropenia  WBC- 2.7. Likely 2/2 to influenza infection. She also received 2L of IVF(1L outpaitent and 1L in the ED) which could have dilution effect. Also considered malignancy. Has never been neutropenic previously even in setting of infection. Recommend repeat CBC outpatient in 6 weeks absence of illness.  #Hypothyroidism TSH was 5.381, T4 low to 0.74. Recommend repeat TSH in 6 weeks    COVID-19 Vaccination: Up to date  Touchbase with Outpatient Provider: Warm Handoff: Not completed secondary to unable to contact  Procedures: None No admission procedures for hospital encounter. ______________________________________________________________________ Discharge Medications:   Your Medication List     STOP taking these medications    doxepin  10 MG capsule Commonly known as: SINEquan    tizanidine  4 MG tablet Commonly known as: ZANAFLEX        START taking these  medications     gabapentin  300 MG capsule Commonly known as: NEURONTIN  Take 1 capsule (300 mg total) by mouth Three (3) times a day.   nitrofurantoin (macrocrystal-monohydrate) 100 MG capsule Commonly known as: MACROBID Take 1 capsule (100 mg total) by mouth two (2) times a day for 3 days.   oseltamivir 75 MG capsule Commonly known as: TAMIFLU Take 1 capsule (75 mg total) by mouth two (2) times a day for 7 doses.       CHANGE how you take these medications    rOPINIRole  1 MG tablet Commonly known as: REQUIP  Take 1 tablet (1 mg total) by mouth nightly. What changed:  how much to take additional instructions       CONTINUE taking these medications    losartan 25 MG tablet Commonly known as: COZAAR Take 1 tablet (25 mg total) by mouth daily.   phentermine  30 MG capsule Take 1 capsule (30 mg total) by mouth daily.        Allergies: Bupropion  ______________________________________________________________________ Pending Test Results (if blank, then none): Pending Labs     Order Current Status   Blood Culture #1 Preliminary result   Blood Culture #2 Preliminary result       Most Recent Labs: All lab results last 24 hours -  Recent Results (from the past 24 hour(s))  Basic metabolic panel   Collection Time: 05/14/22  5:59 AM  Result Value Ref Range   Sodium 146 (H) 135 - 145 mmol/L   Potassium 2.8 (L) 3.4 - 4.8 mmol/L   Chloride 111 (H) 98 - 107 mmol/L   CO2 26.9 20.0 - 31.0 mmol/L   Anion Gap 8 5 - 14 mmol/L   BUN 5 (L) 9 - 23 mg/dL   Creatinine 9.27 9.44 - 1.02 mg/dL   BUN/Creatinine Ratio 7    eGFR CKD-EPI (2021) Female >90 >=60 mL/min/1.35m2   Glucose 94 70 - 179 mg/dL   Calcium  8.9 8.7 - 10.4 mg/dL  Magnesium  Level   Collection Time: 05/14/22  5:59 AM  Result Value Ref Range   Magnesium  2.1 1.6 - 2.6 mg/dL  CBC   Collection Time: 05/14/22  5:59 AM  Result Value Ref Range   WBC 2.6 (L) 3.6 - 11.2 10*9/L   RBC 4.33 3.95 - 5.13 10*12/L   HGB 12.3 11.3 -  14.9 g/dL   HCT 63.5 65.9 - 55.9 %   MCV 84.0 77.6 - 95.7 fL   MCH 28.5 25.9 - 32.4 pg   MCHC 33.9 32.0 - 36.0 g/dL   RDW 85.6 87.7 - 84.7 %   MPV 7.9 6.8 - 10.7 fL   Platelet 159 150 - 450 10*9/L  Basic Metabolic Panel   Collection Time: 05/14/22 12:35 PM  Result Value Ref Range   Sodium 144 135 - 145 mmol/L   Potassium 4.1 3.4 - 4.8 mmol/L   Chloride 109 (H) 98 - 107 mmol/L   CO2 28.6 20.0 - 31.0 mmol/L   Anion Gap 6 5 - 14 mmol/L   BUN 6 (L) 9 - 23 mg/dL   Creatinine 9.12 9.44 - 1.02 mg/dL   BUN/Creatinine Ratio 7    eGFR CKD-EPI (2021) Female 73 >=60 mL/min/1.62m2   Glucose 138 70 - 179 mg/dL   Calcium  9.0 8.7 - 10.4 mg/dL    Relevant Studies/Radiology (if blank, then none): Echocardiogram W Colorflow Spectral Doppler  Result Date: 05/13/2022 Patient Info Name:     Lamerle Jabs Age:     66  years DOB:     January 31, 1956 Gender:     Female MRN:     [id number] Accession #:     [id number] UN Account #:     [id number] Ht:     160 cm Wt:     92 kg BSA:     2.07 m2 BP:     101 /     53 mmHg Exam Date:     05/13/2022 10:23 AM Admit Date:     05/12/2022 Exam Type:     ECHOCARDIOGRAM W COLORFLOW SPECTRAL DOPPLER Technical Quality:     Fair Staff Sonographer:     Yetta Battiest Reading Fellow:     Marsa Clara MD Study Info Indications      - bradycardia,  r/o myocarditis Procedure(s)   Complete two-dimensional, color flow and Doppler transthoracic echocardiogram is performed. Summary   1. The left ventricle is normal in size with normal wall thickness.   2. The left ventricular systolic function is normal, LVEF is visually estimated at > 55%.   3. The aortic valve is trileaflet with mildly thickened leaflets with normal excursion.   4. The right ventricle is normal in size, with normal systolic function. Left Ventricle   The left ventricle is normal in size with normal wall thickness. The left ventricular systolic function is normal, LVEF is visually estimated at > 55%. The left  ventricular diastolic function is indeterminate. Right Ventricle   The right ventricle is normal in size, with normal systolic function. Left Atrium   The left atrium is mildly dilated in size. Right Atrium   The right atrium is normal in size. Aortic Valve   The aortic valve is trileaflet with mildly thickened leaflets with normal excursion. There is trivial aortic regurgitation. There is no evidence of a significant transvalvular gradient. Mitral Valve   The mitral valve leaflets are normal with normal leaflet mobility. There is no significant mitral valve regurgitation. Tricuspid Valve   The tricuspid valve leaflets are normal, with normal leaflet mobility. There is mild tricuspid regurgitation. There is no pulmonary hypertension. TR maximum velocity: 2.1 m/s  Estimated PASP: 20 mmHg. Pulmonic Valve   The pulmonic valve is normal. There is no significant pulmonic regurgitation. There is no evidence of a significant transvalvular gradient. Aorta   The aorta is upper normal in size in the visualized segments. Inferior Vena Cava   IVC size and inspiratory change suggest normal right atrial pressure. (0-5 mmHg). Pericardium/Pleural   There is a trivial pericardial effusion. Other Findings   Rhythm: Bradycardia. Ventricles ---------------------------------------------------------------------- Name                                 Value        Normal ---------------------------------------------------------------------- LV Dimensions 2D/MM ----------------------------------------------------------------------  IVS Diastolic Thickness (2D)                                1.3 cm       0.6-0.9 LVID Diastole (2D)                  3.8 cm       3.8-5.2  LVPW Diastolic Thickness (2D)                                1.3 cm  0.6-0.9 LVID Systole (2D)                   2.7 cm       2.2-3.5 RV Dimensions 2D/MM ----------------------------------------------------------------------  RV Basal Diastolic Dimension                            2.8 cm       2.5-4.1 TAPSE                               2.2 cm         >=1.7 Atria ---------------------------------------------------------------------- Name                                 Value        Normal ---------------------------------------------------------------------- LA Dimensions ---------------------------------------------------------------------- LA Dimension (2D)                   3.6 cm       2.7-3.8 LA Volume Index (4C A-L)        35.80 ml/m2               LA Volume Index (2C A-L)        37.35 ml/m2               LA Volume (BP MOD)                   71 ml               LA Volume Index (BP MOD)        34.28 ml/m2   16.00-34.00 RA Dimensions ---------------------------------------------------------------------- RA Area (4C)                      10.2 cm2        <=18.0 RA Area (4C) Index              4.9 cm2/m2               RA ESV Index (4C MOD)              8 ml/m2         15-27 Aortic Valve ---------------------------------------------------------------------- Name                                 Value        Normal ---------------------------------------------------------------------- AV Doppler ---------------------------------------------------------------------- AV Peak Velocity                   1.4 m/s               AV Peak Gradient                    8 mmHg Mitral Valve ---------------------------------------------------------------------- Name                                 Value        Normal ---------------------------------------------------------------------- MV Annular TDI ---------------------------------------------------------------------- MV Septal e' Velocity             5.8 cm/s         >=8.0 MV Lateral e' Velocity  10.0 cm/s        >=10.0 MV e' Average                     7.9 cm/s Tricuspid Valve ---------------------------------------------------------------------- Name                                 Value        Normal  ---------------------------------------------------------------------- TV Regurgitation Doppler ---------------------------------------------------------------------- TR Peak Velocity                   2.1 m/s               Estimated PAP/RSVP ---------------------------------------------------------------------- RA Pressure                         3 mmHg           <=5 RV Systolic Pressure               20 mmHg           <36 Pulmonic Valve ---------------------------------------------------------------------- Name                                 Value        Normal ---------------------------------------------------------------------- PV Doppler ---------------------------------------------------------------------- PV Peak Velocity                   0.7 m/s Aorta ---------------------------------------------------------------------- Name                                 Value        Normal ---------------------------------------------------------------------- Ascending Aorta ---------------------------------------------------------------------- Ao Root Diameter (2D)               3.5 cm               Ao Root Diam Index (2D)          1.7 cm/m2 Report Signatures Finalized by Augustin LITTIE Gearing  MD on 05/13/2022 12:42 PM Resident Marsa Clara  MD on 05/13/2022 11:54 AM  ECG 12 Lead  Result Date: 05/13/2022 NORMAL SINUS RHYTHM NORMAL ECG WHEN COMPARED WITH ECG OF 12-May-2022 16:29, VENT. RATE HAS INCREASED BY  24 BPM  ECG 12 Lead  Result Date: 05/13/2022 MARKED SINUS BRADYCARDIA ABNORMAL ECG NO PREVIOUS ECGS AVAILABLE Confirmed by Leni Mings ([id number]) on 05/13/2022 12:13:12 AM  XR Chest Portable  Result Date: 05/12/2022 EXAM: XR CHEST PORTABLE ACCESSION: [id number] UN CLINICAL INDICATION: COUGH  TECHNIQUE: Single View AP Chest Radiograph. COMPARISON: None FINDINGS: Lungs are clear.  No pleural effusion or pneumothorax. Normal heart size and mediastinal contours.   No acute abnormality.  ED POCUS [place]  Protocol Emergency  Result Date: 05/12/2022 Limited Cardiac Ultrasound (CPT [phone]) Indication: A focused ultrasound exam of the heart was performed to evaluate for pericardial effusion, tamponade, severe hypovolemia, or gross abnormalities of cardiac anatomy or function in this patient. The ultrasound was performed with the following indications, as noted in the H&P: Other indications as noted in the H&P Identified structures: The pericardial sac, myocardium, and 4 chambers were identified using the following views: subxiphoid, parasternal long axis, parasternal short axis, apical 4-chamber, and IVC (long axis) Findings: Exam of the above structures revealed the following findings:  Pericardial effusion: Present with a trace effusion  Pericardial tamponade: N/A  Global LV function: Normal  Right ventricular size: Normal   Signs of RV strain: N/A  IVC: Dilated  Other findings: Bradycardic.  Lungs without evidence of B-lines.  Trace pericardial effusion Limitations: None. Impression: No sonographic evidence of significant cardiac dysfunction and Normal RV.  Trace pericardial effusion. Dilated IVC without B-lines. Interpreted by: Marty Hedger, MD Limited Renal [place] [phone]) Indication: A focused ultrasound of the kidneys was performed to evaluate for hydronephrosis and nephrolithiasis. The ultrasound was performed with the following indications, as noted in the H&P: Other indications as noted in the H&P Identified structures: Right kidney, Left kidney, and Bladder Findings: Exam of the above structures revealed the following findings:  Hydronephrosis: Absent   If present: N/A   If present: N/A  Intrarenal Calculi: Absent   If present: N/A  Ureteral Calculi: Absent   If present: N/A  Vesicular Calculi: Absent Limitations: None. Impression: Normal limited renal ultrasound, no evidence of hydronephrosis or calculi Interpreted by: Marty Hedger, MD    ______________________________________________________________________ Discharge Instructions:    Thank you for allowing us  to take care of you! You were admitted to the hospital due to bradycardia (slow heart rate). You also had an influenza infection that could have contributed to feeling weak. You also were diagnosed with a UTI. We discussed the heart issue with the cardiologist and they determined that your bradycardia is most likely due to medications as well as influenza. These medications include Doxepin , Zanaflex , and Requip . It is important that you follow up with your PCP so that you can discuss starting different medications or adjusting doses of these meds for your chronic illnesses. We wish you all the best, take care!   Please continue taking the following medications: - Macrobid 100 mg twice daily - Tamiflu 75 mg twice daily  You will need to take your second daily dose of these medications on the evening of your discharge at 9:00 PM           ______________________________________________________________________ Discharge Day Services: BP 106/54   Pulse (!) 48   Temp 37 C (98.6 F) (Oral)   Resp 17   Ht 160 cm (5' 3)   Wt 92.3 kg (203 lb 7.8 oz)   SpO2 96%   BMI 36.05 kg/m  Pt seen on the day of discharge and determined appropriate for discharge.  GEN: well appearing, lying in bed, NAD HEENT: NCAT, MMM. EOMI. Neck: Supple. CV: Regular rate and rhythm. No murmurs/rubs/gallops. Pulm: CTAB. No wheezing, crackles, or rhonchi. Abd: Flat.  Nontender. No guarding, rebound.  Normoactive bowel sounds.   Neuro: A&O x 3. No focal deficits.  Ext: No peripheral edema.  Palpable distal pulses.  Condition at Discharge: good  Length of Discharge: I spent greater than 30 mins in the discharge of this patient.

## 2022-09-05 ENCOUNTER — Emergency Department
Admission: EM | Admit: 2022-09-05 | Discharge: 2022-09-05 | Disposition: A | Discharge status: Home / Self Care | Payer: Medicare Other | Attending: Emergency Medicine | Admitting: Emergency Medicine

## 2022-09-05 ENCOUNTER — Emergency Department: Payer: Medicare Other

## 2022-09-05 DIAGNOSIS — T781XXA Other adverse food reactions, not elsewhere classified, initial encounter: Secondary | ICD-10-CM | POA: Diagnosis not present

## 2022-09-05 DIAGNOSIS — R059 Cough, unspecified: Secondary | ICD-10-CM | POA: Diagnosis not present

## 2022-09-05 DIAGNOSIS — J069 Acute upper respiratory infection, unspecified: Secondary | ICD-10-CM | POA: Insufficient documentation

## 2022-09-05 DIAGNOSIS — R220 Localized swelling, mass and lump, head: Secondary | ICD-10-CM | POA: Diagnosis present

## 2022-09-05 DIAGNOSIS — I10 Essential (primary) hypertension: Secondary | ICD-10-CM | POA: Diagnosis not present

## 2022-09-05 DIAGNOSIS — T7840XA Allergy, unspecified, initial encounter: Secondary | ICD-10-CM

## 2022-09-05 LAB — CBC WITH DIFFERENTIAL/PLATELET
Abs Immature Granulocytes: 0.02 10*3/uL (ref 0.00–0.07)
Basophils Absolute: 0 10*3/uL (ref 0.0–0.1)
Basophils Relative: 0 %
Eosinophils Absolute: 0.1 10*3/uL (ref 0.0–0.5)
Eosinophils Relative: 2 %
HCT: 34.1 % — ABNORMAL LOW (ref 36.0–46.0)
Hemoglobin: 11 g/dL — ABNORMAL LOW (ref 12.0–15.0)
Immature Granulocytes: 0 %
Lymphocytes Relative: 29 %
Lymphs Abs: 1.8 10*3/uL (ref 0.7–4.0)
MCH: 27.7 pg (ref 26.0–34.0)
MCHC: 32.3 g/dL (ref 30.0–36.0)
MCV: 85.9 fL (ref 80.0–100.0)
Monocytes Absolute: 0.5 10*3/uL (ref 0.1–1.0)
Monocytes Relative: 7 %
Neutro Abs: 3.8 10*3/uL (ref 1.7–7.7)
Neutrophils Relative %: 62 %
Platelets: 237 10*3/uL (ref 150–400)
RBC: 3.97 MIL/uL (ref 3.87–5.11)
RDW: 13.3 % (ref 11.5–15.5)
WBC: 6.3 10*3/uL (ref 4.0–10.5)
nRBC: 0 % (ref 0.0–0.2)

## 2022-09-05 LAB — COMPREHENSIVE METABOLIC PANEL
ALT: 14 U/L (ref 0–44)
AST: 27 U/L (ref 15–41)
Albumin: 3.6 g/dL (ref 3.5–5.0)
Alkaline Phosphatase: 96 U/L (ref 38–126)
Anion gap: 8 (ref 5–15)
BUN: 15 mg/dL (ref 8–23)
CO2: 20 mmol/L — ABNORMAL LOW (ref 22–32)
Calcium: 8.5 mg/dL — ABNORMAL LOW (ref 8.9–10.3)
Chloride: 109 mmol/L (ref 98–111)
Creatinine, Ser: 0.87 mg/dL (ref 0.44–1.00)
GFR, Estimated: >60 mL/min (ref >60)
Glucose, Bld: 95 mg/dL (ref 70–99)
Potassium: 3.6 mmol/L (ref 3.5–5.1)
Sodium: 137 mmol/L (ref 135–145)
Total Bilirubin: 0.7 mg/dL (ref 0.3–1.2)
Total Protein: 6.9 g/dL (ref 6.5–8.1)

## 2022-09-05 MED ORDER — LORATADINE 10 MG PO TABS
10.0000 mg | ORAL_TABLET | Freq: Once | ORAL | Status: AC
  Administered 2022-09-05: 10 mg via ORAL
  Filled 2022-09-05: qty 1

## 2022-09-05 MED ORDER — PREDNISONE 20 MG PO TABS: 40.0000 mg | ORAL_TABLET | Freq: Every day | ORAL | 0 refills | Status: AC

## 2022-09-05 MED ORDER — EPINEPHRINE 0.3 MG/0.3ML IJ SOAJ: 0.3000 mg | Freq: Once | INTRAMUSCULAR | 0 refills | Status: AC

## 2022-09-05 MED ORDER — PREDNISONE 20 MG PO TABS
40.0000 mg | ORAL_TABLET | Freq: Once | ORAL | Status: AC
  Administered 2022-09-05: 40 mg via ORAL
  Filled 2022-09-05: qty 2

## 2022-09-05 NOTE — ED Provider Notes (Signed)
[facility] Provider Note    Event Date/Time   First MD Initiated Contact with Patient 09/05/22 2048     (approximate)   History   Allergic Reaction   HPI  Hattie Pine is a 67 y.o. female reports a history of depression and hypertension.  For the last 4 to 5 days she has had a slight nasal congestion slight dry cough.  She was using Mucinex but did not take a dose this morning, used some yesterday.  She been doing well until this evening when she ate some raspberries, and thereafter she suddenly noticed that her tongue felt strange and it looked swollen.  She had an epinephrine pen available due to a history of allergy to COVID-vaccine, and utilized it.  She reports that not long afterwards the swelling improved and she feels much better.  She felt briefly like her throat was swelling but that has since resolved  She does take blood pressure medicine at times, but reports she has not taken her blood pressure medicine for about a week.  She does report a history of a bad reaction to COVID-vaccine.  Denies shortness of breath.  She has had a slight cough nasal congestion and feels like she has a mild sinus "cold".  The swelling of her tongue is gone away.  She does not have any itching or rash.  Symptoms of improved.  She feels a little bit dry after the epinephrine, she reports it gave her a "rush" feeling that is since improved       Physical Exam   Triage Vital Signs: ED Triage Vitals  Enc Vitals Group     BP 09/05/22 2036 (!) 133/59     Pulse Rate 09/05/22 2036 (!) 55     Resp 09/05/22 2036 20     Temp 09/05/22 2036 97.8 F (36.6 C)     Temp Source 09/05/22 2036 Oral     SpO2 09/05/22 2036 97 %     Weight 09/05/22 2033 205 lb (93 kg)     Height 09/05/22 2033  (1.6 m)     Head Circumference --      Peak Flow --      Pain Score 09/05/22 2032 0     Pain Loc --      Pain Edu? --      Excl. in GC? --     Most recent vital signs: Vitals:    09/05/22 2036  BP: (!) 133/59  Pulse: (!) 55  Resp: 20  Temp: 97.8 F (36.6 C)  SpO2: 97%     General: Awake, no distress.  Very pleasant.  Husband at bedside. CV:  Good peripheral perfusion.  Normal rate and tone Resp:  Normal effort.  Clear bilaterally Abd:  No distention.  Soft nontender nondistended Other:  Oropharynx is widely patent.  Tongue appears normal.  No angioedema.  No stridor.  Work of breathing normal.  Reports symptoms are greatly improved.  Estimates that the symptoms occurred about 8 PM   ED Results / Procedures / Treatments   Labs (all labs ordered are listed, but only abnormal results are displayed) Labs Reviewed  CBC WITH DIFFERENTIAL/PLATELET - Abnormal; Notable for the following components:      Result Value   Hemoglobin 11.0 (*)    HCT 34.1 (*)    All other components within normal limits  COMPREHENSIVE METABOLIC PANEL - Abnormal; Notable for the following components:   CO2 20 (*)  Calcium 8.5 (*)    All other components within normal limits     EKG  Reviewed inter by me at 2040 heart rate 55 QRS 90 QTc 440 Normal sinus rhythm.  No evidence of acute ischemia  Patient denies chest pain.   RADIOLOGY  Chest x-ray interpreted by me as normal   PROCEDURES:  Critical Care performed: No  Procedures   MEDICATIONS ORDERED IN ED: Medications  predniSONE (DELTASONE) tablet 40 mg (40 mg Oral Given 09/05/22 2100)  loratadine (CLARITIN) tablet 10 mg (10 mg Oral Given 09/05/22 2100)     IMPRESSION / MDM / ASSESSMENT AND PLAN / ED COURSE  I reviewed the triage vital signs and the nursing notes.                              Differential diagnosis includes, but is not limited to, possible allergic reaction, she reports symptoms of a rapid onset of tongue swelling and use of epinephrine pen at home.  She advises that she felt like her throat had a closing sensation and those symptoms have now all resolved.  She is very reassuring exam now.   Normal vital signs no acute ongoing symptoms of allergic reaction or angioedema.  She does not take an ACE inhibitor.  She reports that she has not currently taking her blood pressure medicine for about a week.  At this juncture plan to observe the patient for about 3 hours, will give prednisone and loratadine.  Patient will need a refill on her epinephrine pen and I will provide prescription for prednisone as well.  Discussed plan for observation here for few hours as well as very careful return precautions and treatment recommendations with her and her husband were both in agreement  Given she has had a slight cough but clear lungs afebrile for about 4 days, and her presentation to the ER tonight we will check a chest x-ray to evaluate exclude infiltrate though my pretest probability for acute pneumonia is low.  Patient's presentation is most consistent with acute illness / injury with system symptoms.   The patient is on the cardiac monitor to evaluate for evidence of arrhythmia and/or significant heart rate changes.   ----------------------------------------- 10:06 PM on 09/05/2022 ----------------------------------------- Continue to observe patient.  No ongoing issues or symptoms.  Reassuring observation.  To this point.  Patient resting comfortably, husband at bedside.   ----------------------------------------- 11:18 PM on 09/05/2022 ----------------------------------------- Resting comfortably, no recurrence of symptoms.  Vital signs normal.  Fully awake and alert.  No angioedema difficulty swallowing or ongoing concerns.  Discussed with patient, return precautions, recommendation to follow-up with [facility] for allergist visit, and also return precautions regarding possible signs symptoms or recurrence of swelling or possible her allergies/anaphylaxis  FINAL CLINICAL IMPRESSION(S) / ED DIAGNOSES   Final diagnoses:  Allergic reaction, initial encounter  Viral URI with cough      Rx / DC Orders   ED Discharge Orders          Ordered    predniSONE (DELTASONE) 20 MG tablet  Daily with breakfast        09/05/22 2105    EPINEPHrine 0.3 mg/0.3 mL IJ SOAJ injection   Once        09/05/22 2105             Note:  This document was prepared using Dragon voice recognition software and may include unintentional dictation errors.   Scottlyn Mchaney,  Loraine Leriche, MD 09/05/22 [id number]

## 2022-09-05 NOTE — ED Triage Notes (Signed)
To triage via wheelchair with c/o difficulty breathing, possible allergic reaction. Had raspberries and blackberries a few hours ago that she normally doesn't eat but is not aware of a specific allergy to those things.  Pt had Epi-pen at home that she uses for other allergies and used that apx 30 mins PTA.  Pt currently able to cough and control secretions but states she feels she is having difficulty speaking and does endorse nausea
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
## 2024-01-11 ENCOUNTER — Other Ambulatory Visit: Payer: Self-pay

## 2024-01-11 ENCOUNTER — Emergency Department

## 2024-01-11 ENCOUNTER — Inpatient Hospital Stay
Admission: EM | Admit: 2024-01-11 | Discharge: 2024-01-13 | DRG: 315 | Disposition: A | Discharge status: Home / Self Care | Attending: Internal Medicine | Admitting: Internal Medicine

## 2024-01-11 DIAGNOSIS — R001 Bradycardia, unspecified: Secondary | ICD-10-CM | POA: Diagnosis present

## 2024-01-11 DIAGNOSIS — M797 Fibromyalgia: Secondary | ICD-10-CM | POA: Diagnosis present

## 2024-01-11 DIAGNOSIS — F319 Bipolar disorder, unspecified: Secondary | ICD-10-CM | POA: Diagnosis present

## 2024-01-11 DIAGNOSIS — R7989 Other specified abnormal findings of blood chemistry: Secondary | ICD-10-CM | POA: Diagnosis not present

## 2024-01-11 DIAGNOSIS — R9431 Abnormal electrocardiogram [ECG] [EKG]: Secondary | ICD-10-CM | POA: Diagnosis present

## 2024-01-11 DIAGNOSIS — Z8249 Family history of ischemic heart disease and other diseases of the circulatory system: Secondary | ICD-10-CM | POA: Diagnosis not present

## 2024-01-11 DIAGNOSIS — G4700 Insomnia, unspecified: Secondary | ICD-10-CM | POA: Diagnosis present

## 2024-01-11 DIAGNOSIS — E860 Dehydration: Secondary | ICD-10-CM | POA: Diagnosis present

## 2024-01-11 DIAGNOSIS — Z79899 Other long term (current) drug therapy: Secondary | ICD-10-CM

## 2024-01-11 DIAGNOSIS — Z5948 Other specified lack of adequate food: Secondary | ICD-10-CM

## 2024-01-11 DIAGNOSIS — R55 Syncope and collapse: Secondary | ICD-10-CM | POA: Diagnosis present

## 2024-01-11 DIAGNOSIS — E038 Other specified hypothyroidism: Secondary | ICD-10-CM | POA: Diagnosis present

## 2024-01-11 DIAGNOSIS — F419 Anxiety disorder, unspecified: Secondary | ICD-10-CM | POA: Diagnosis present

## 2024-01-11 DIAGNOSIS — G894 Chronic pain syndrome: Secondary | ICD-10-CM | POA: Diagnosis present

## 2024-01-11 DIAGNOSIS — Z833 Family history of diabetes mellitus: Secondary | ICD-10-CM | POA: Diagnosis not present

## 2024-01-11 DIAGNOSIS — I4581 Long QT syndrome: Secondary | ICD-10-CM | POA: Diagnosis not present

## 2024-01-11 DIAGNOSIS — I10 Essential (primary) hypertension: Secondary | ICD-10-CM | POA: Diagnosis present

## 2024-01-11 DIAGNOSIS — Z6838 Body mass index (BMI) 38.0-38.9, adult: Secondary | ICD-10-CM

## 2024-01-11 DIAGNOSIS — I2510 Atherosclerotic heart disease of native coronary artery without angina pectoris: Secondary | ICD-10-CM | POA: Diagnosis present

## 2024-01-11 DIAGNOSIS — Z79890 Hormone replacement therapy (postmenopausal): Secondary | ICD-10-CM

## 2024-01-11 DIAGNOSIS — Z7982 Long term (current) use of aspirin: Secondary | ICD-10-CM | POA: Diagnosis not present

## 2024-01-11 DIAGNOSIS — Z9884 Bariatric surgery status: Secondary | ICD-10-CM | POA: Diagnosis not present

## 2024-01-11 DIAGNOSIS — F603 Borderline personality disorder: Secondary | ICD-10-CM | POA: Diagnosis present

## 2024-01-11 DIAGNOSIS — E872 Acidosis, unspecified: Secondary | ICD-10-CM | POA: Diagnosis present

## 2024-01-11 DIAGNOSIS — I214 Non-ST elevation (NSTEMI) myocardial infarction: Secondary | ICD-10-CM

## 2024-01-11 DIAGNOSIS — E785 Hyperlipidemia, unspecified: Secondary | ICD-10-CM | POA: Diagnosis present

## 2024-01-11 DIAGNOSIS — N179 Acute kidney failure, unspecified: Secondary | ICD-10-CM | POA: Diagnosis present

## 2024-01-11 DIAGNOSIS — Z888 Allergy status to other drugs, medicaments and biological substances status: Secondary | ICD-10-CM

## 2024-01-11 DIAGNOSIS — I959 Hypotension, unspecified: Secondary | ICD-10-CM | POA: Diagnosis present

## 2024-01-11 DIAGNOSIS — T43015A Adverse effect of tricyclic antidepressants, initial encounter: Secondary | ICD-10-CM | POA: Diagnosis present

## 2024-01-11 DIAGNOSIS — E876 Hypokalemia: Secondary | ICD-10-CM | POA: Diagnosis present

## 2024-01-11 DIAGNOSIS — I2489 Other forms of acute ischemic heart disease: Secondary | ICD-10-CM | POA: Diagnosis present

## 2024-01-11 DIAGNOSIS — Z604 Social exclusion and rejection: Secondary | ICD-10-CM | POA: Diagnosis present

## 2024-01-11 DIAGNOSIS — I700 Atherosclerosis of aorta: Secondary | ICD-10-CM | POA: Diagnosis present

## 2024-01-11 DIAGNOSIS — Z5941 Food insecurity: Secondary | ICD-10-CM

## 2024-01-11 LAB — PROTIME-INR
INR: 1.2 (ref 0.8–1.2)
Prothrombin Time: 15.7 s — ABNORMAL HIGH (ref 11.4–15.2)

## 2024-01-11 LAB — COMPREHENSIVE METABOLIC PANEL WITH GFR
ALT: 9 U/L (ref 0–44)
AST: 25 U/L (ref 15–41)
Albumin: 3.2 g/dL — ABNORMAL LOW (ref 3.5–5.0)
Alkaline Phosphatase: 74 U/L (ref 38–126)
Anion gap: 11 (ref 5–15)
BUN: 20 mg/dL (ref 8–23)
CO2: 20 mmol/L — ABNORMAL LOW (ref 22–32)
Calcium: 8.2 mg/dL — ABNORMAL LOW (ref 8.9–10.3)
Chloride: 111 mmol/L (ref 98–111)
Creatinine, Ser: 1.4 mg/dL — ABNORMAL HIGH (ref 0.44–1.00)
GFR, Estimated: 41 mL/min — ABNORMAL LOW (ref >60)
Glucose, Bld: 122 mg/dL — ABNORMAL HIGH (ref 70–99)
Potassium: 3.2 mmol/L — ABNORMAL LOW (ref 3.5–5.1)
Sodium: 142 mmol/L (ref 135–145)
Total Bilirubin: 1.5 mg/dL — ABNORMAL HIGH (ref 0.0–1.2)
Total Protein: 6.1 g/dL — ABNORMAL LOW (ref 6.5–8.1)

## 2024-01-11 LAB — RESP PANEL BY RT-PCR (RSV, FLU A&B, COVID)  RVPGX2
Influenza A by PCR: NEGATIVE
Influenza B by PCR: NEGATIVE
Resp Syncytial Virus by PCR: NEGATIVE
SARS Coronavirus 2 by RT PCR: NEGATIVE

## 2024-01-11 LAB — CBC
HCT: 34.9 % — ABNORMAL LOW (ref 36.0–46.0)
Hemoglobin: 11.3 g/dL — ABNORMAL LOW (ref 12.0–15.0)
MCH: 27.7 pg (ref 26.0–34.0)
MCHC: 32.4 g/dL (ref 30.0–36.0)
MCV: 85.5 fL (ref 80.0–100.0)
Platelets: 237 K/uL (ref 150–400)
RBC: 4.08 MIL/uL (ref 3.87–5.11)
RDW: 14.5 % (ref 11.5–15.5)
WBC: 6.1 K/uL (ref 4.0–10.5)
nRBC: 0 % (ref 0.0–0.2)

## 2024-01-11 LAB — LACTIC ACID, PLASMA
Lactic Acid, Venous: 2 mmol/L (ref 0.5–1.9)
Lactic Acid, Venous: 2.6 mmol/L (ref 0.5–1.9)
Lactic Acid, Venous: 1.2 mmol/L (ref 0.5–1.9)

## 2024-01-11 LAB — T4, FREE: Free T4: 0.7 ng/dL (ref 0.61–1.12)

## 2024-01-11 LAB — CK: Total CK: 79 U/L (ref 38–234)

## 2024-01-11 LAB — TROPONIN I (HIGH SENSITIVITY)
Troponin I (High Sensitivity): 337 ng/L (ref <18)
Troponin I (High Sensitivity): 239 ng/L (ref <18)

## 2024-01-11 LAB — TSH: TSH: 9.334 u[IU]/mL — ABNORMAL HIGH (ref 0.350–4.500)

## 2024-01-11 LAB — CORTISOL: Cortisol, Plasma: 3.9 ug/dL

## 2024-01-11 LAB — APTT: aPTT: 35 s (ref 24–36)

## 2024-01-11 LAB — HEPARIN LEVEL (UNFRACTIONATED)
Heparin Unfractionated: <0.1 [IU]/mL — ABNORMAL LOW (ref 0.30–0.70)
Heparin Unfractionated: <0.1 [IU]/mL — ABNORMAL LOW (ref 0.30–0.70)

## 2024-01-11 LAB — PHOSPHORUS: Phosphorus: 2.8 mg/dL (ref 2.5–4.6)

## 2024-01-11 LAB — MAGNESIUM: Magnesium: 2 mg/dL (ref 1.7–2.4)

## 2024-01-11 MED ORDER — SODIUM CHLORIDE 0.9% FLUSH
3.0000 mL | Freq: Two times a day (BID) | INTRAVENOUS | Status: DC
  Administered 2024-01-11 – 2024-01-12 (×2): 3 mL via INTRAVENOUS
  Administered 2024-01-12: 10 mL via INTRAVENOUS
  Administered 2024-01-12: 3 mL via INTRAVENOUS
  Administered 2024-01-13: 20 mL via INTRAVENOUS

## 2024-01-11 MED ORDER — HEPARIN BOLUS VIA INFUSION
4000.0000 [IU] | Freq: Once | INTRAVENOUS | Status: AC
  Administered 2024-01-11: 4000 [IU] via INTRAVENOUS
  Filled 2024-01-11: qty 4000

## 2024-01-11 MED ORDER — PHENTERMINE HCL 30 MG PO CAPS: 30.0000 mg | ORAL_CAPSULE | Freq: Every day | ORAL | Status: DC

## 2024-01-11 MED ORDER — CELECOXIB 200 MG PO CAPS
200.0000 mg | ORAL_CAPSULE | Freq: Two times a day (BID) | ORAL | Status: DC | PRN
  Administered 2024-01-12: 200 mg via ORAL
  Filled 2024-01-11 (×2): qty 1

## 2024-01-11 MED ORDER — GABAPENTIN 300 MG PO CAPS
300.0000 mg | ORAL_CAPSULE | Freq: Every day | ORAL | Status: DC
  Administered 2024-01-11 – 2024-01-12 (×2): 300 mg via ORAL
  Filled 2024-01-11 (×2): qty 1

## 2024-01-11 MED ORDER — SODIUM CHLORIDE 0.9 % IV BOLUS
500.0000 mL | Freq: Once | INTRAVENOUS | Status: AC
  Administered 2024-01-11: 500 mL via INTRAVENOUS

## 2024-01-11 MED ORDER — ONDANSETRON HCL 4 MG/2ML IJ SOLN: 4.0000 mg | Freq: Four times a day (QID) | INTRAMUSCULAR | Status: DC | PRN

## 2024-01-11 MED ORDER — LAMOTRIGINE 25 MG PO TABS: 50.0000 mg | ORAL_TABLET | Freq: Every day | ORAL | Status: DC

## 2024-01-11 MED ORDER — NOREPINEPHRINE 4 MG/250ML-% IV SOLN
0.0000 ug/min | INTRAVENOUS | Status: DC
  Filled 2024-01-11: qty 250

## 2024-01-11 MED ORDER — HEPARIN (PORCINE) 25000 UT/250ML-% IV SOLN
1400.0000 [IU]/h | INTRAVENOUS | Status: DC
  Administered 2024-01-11: 1000 [IU]/h via INTRAVENOUS
  Administered 2024-01-12: 1250 [IU]/h via INTRAVENOUS
  Filled 2024-01-11 (×2): qty 250

## 2024-01-11 MED ORDER — POTASSIUM CHLORIDE CRYS ER 20 MEQ PO TBCR
40.0000 meq | EXTENDED_RELEASE_TABLET | ORAL | Status: AC
  Administered 2024-01-11 (×2): 40 meq via ORAL
  Filled 2024-01-11 (×2): qty 2

## 2024-01-11 MED ORDER — DOXEPIN HCL 10 MG PO CAPS
10.0000 mg | ORAL_CAPSULE | Freq: Every day | ORAL | Status: DC
  Filled 2024-01-11: qty 1

## 2024-01-11 MED ORDER — NORTRIPTYLINE HCL 25 MG PO CAPS: 25.0000 mg | ORAL_CAPSULE | Freq: Two times a day (BID) | ORAL | Status: DC

## 2024-01-11 MED ORDER — HYDROXYZINE HCL 25 MG PO TABS
25.0000 mg | ORAL_TABLET | Freq: Four times a day (QID) | ORAL | Status: DC
  Administered 2024-01-11 – 2024-01-12 (×2): 25 mg via ORAL
  Filled 2024-01-11 (×2): qty 1

## 2024-01-11 MED ORDER — ONDANSETRON HCL 4 MG PO TABS: 4.0000 mg | ORAL_TABLET | Freq: Four times a day (QID) | ORAL | Status: DC | PRN

## 2024-01-11 MED ORDER — HEPARIN BOLUS VIA INFUSION
2200.0000 [IU] | Freq: Once | INTRAVENOUS | Status: AC
  Administered 2024-01-11: 2200 [IU] via INTRAVENOUS
  Filled 2024-01-11: qty 2200

## 2024-01-11 MED ORDER — ALBUTEROL SULFATE (2.5 MG/3ML) 0.083% IN NEBU: 2.5000 mg | INHALATION_SOLUTION | RESPIRATORY_TRACT | Status: DC | PRN

## 2024-01-11 MED ORDER — LEVOTHYROXINE SODIUM 50 MCG PO TABS
50.0000 ug | ORAL_TABLET | Freq: Every day | ORAL | Status: DC
  Administered 2024-01-12 – 2024-01-13 (×2): 50 ug via ORAL
  Filled 2024-01-11 (×2): qty 1

## 2024-01-11 MED ORDER — SODIUM CHLORIDE 0.9 % IV BOLUS
1000.0000 mL | Freq: Once | INTRAVENOUS | Status: AC
  Administered 2024-01-11: 1000 mL via INTRAVENOUS

## 2024-01-11 MED ORDER — ACETAMINOPHEN 325 MG PO TABS
650.0000 mg | ORAL_TABLET | Freq: Four times a day (QID) | ORAL | Status: DC | PRN
  Administered 2024-01-12: 650 mg via ORAL
  Filled 2024-01-11: qty 2

## 2024-01-11 MED ORDER — ROPINIROLE HCL 1 MG PO TABS
1.0000 mg | ORAL_TABLET | Freq: Two times a day (BID) | ORAL | Status: DC
  Administered 2024-01-11 – 2024-01-12 (×2): 1 mg via ORAL
  Filled 2024-01-11 (×3): qty 1

## 2024-01-11 MED ORDER — SODIUM CHLORIDE 0.9 % IV SOLN: INTRAVENOUS | Status: AC

## 2024-01-11 MED ORDER — IOHEXOL 350 MG/ML SOLN
75.0000 mL | Freq: Once | INTRAVENOUS | Status: AC | PRN
  Administered 2024-01-11: 70 mL via INTRAVENOUS

## 2024-01-11 NOTE — H&P (Addendum)
 History and Physical    Ann Bowen FMW:[id number] DOB: [birth year] DOA: 01/11/2024  PCP: Adina Buel HERO, MD (Confirm with patient/family/NH records and if not entered, this has to be entered at [facility] point of entry) Patient coming from: Home  I have personally briefly reviewed patient's old medical records in [facility]  Chief Complaint: I passed out  HPI: Ann Bowen is a 68 y.o. female with medical history significant of fibromyalgia, bipolar disorder, insomnia, gastric bypass, chronic pain, HTN, borderline hypotension, presented with syncope.  Patient woke up this morning  feeling weird and that she went to gardening, soon she started feeling lightheadedness and then lost consciousness.  Neighbors passing by woke her up, as result she does not remember how much time she her LOC was.  Bystander told her that  you did not look good.  Patient denied any postictal confusion, no tongue biting no loss control of urine or bowel movement.  She denied any shortness of breath or chest pain.  Otherwise patient report has been eating and drinking well, no diarrhea no abdominal pain or nausea vomiting.  EMS arrived and found patient SBP 50s, after given 500 mL of LR BP improved to 70.  Patient reported that at baseline her SBP in the 90s.  2 days ago she was started on nortriptyline  at bedtime. ED Course: Afebrile, borderline bradycardia heart rate 50-60, blood pressure 75/46 improved to 103/60 1:04 0.5 L of IV fluid.  Chest x-ray with cardiomegaly but no acute pulmonary infiltrates.  CTA negative for PE.  Blood work showed troponin 337> 239, EKG showed no acute ST changes, QTc 546> 542.  Patient was started on heparin  drip in the ED.  Review of Systems: As per HPI otherwise 14 point review of systems negative.    Past Medical History:  Diagnosis Date   Arthritis    Borderline personality disorder (HCC)    Depression    Fibromyalgia    Shingles     Past Surgical History:  Procedure  Laterality Date   CHOLECYSTECTOMY     COLONOSCOPY N/A 01/14/2021   Procedure: COLONOSCOPY;  Surgeon: Jinny Carmine, MD;  Location: [facility];  Service: Endoscopy;  Laterality: N/A;  positive home test 12-11-20   COLONOSCOPY WITH PROPOFOL  N/A 01/01/2018   Procedure: COLONOSCOPY WITH PROPOFOL ;  Surgeon: Unk Corinn Skiff, MD;  Location: [facility] ENDOSCOPY;  Service: Gastroenterology;  Laterality: N/A;   GASTRIC BYPASS     SHOULDER SURGERY       reports that she has never smoked. She has never used smokeless tobacco. She reports current alcohol  use. She reports that she does not use drugs.  Allergies  Allergen Reactions   Wellbutrin  [Bupropion ] Hives and Rash    Family History  Problem Relation Age of Onset   Diabetes Mellitus II Mother    Diabetes Mellitus II Father    Hypertension Father    Diabetes Mellitus II Sister    Breast cancer Neg Hx     Prior to Admission medications   Medication Sig Start Date End Date Taking? Authorizing Provider  celecoxib  (CELEBREX ) 200 MG capsule Take by mouth. 10/18/23  Yes [provider]  doxepin  (SINEQUAN ) 10 MG capsule 10 MG capsule Take 20 mg by mouth nightly 09/18/19  Yes [provider]  gabapentin  (NEURONTIN ) 300 MG capsule Take by mouth. 08/27/21  Yes [provider]  hydrOXYzine  (ATARAX ) 25 MG tablet Take 25 mg by mouth 4 (four) times daily. 10/13/23  Yes [provider]  levothyroxine  (  SYNTHROID ) 50 MCG tablet Take 50 mcg by mouth daily. 10/19/23  Yes [provider]  losartan (COZAAR) 25 MG tablet Take 25 mg by mouth daily. 12/02/20  Yes [provider]  nortriptyline  (PAMELOR ) 25 MG capsule Take 25 mg by mouth 2 (two) times daily. 01/11/24  Yes [provider]  phentermine  30 MG capsule Take 30 mg by mouth daily.   Yes [provider]  rOPINIRole  (REQUIP ) 1 MG tablet Take 1 mg by mouth in the morning and at bedtime.    Yes [provider]  tiZANidine  (ZANAFLEX ) 4  MG tablet Take by mouth. 12/20/23  Yes [provider]  acetaminophen  (TYLENOL ) 325 MG tablet Take 2 tablets (650 mg total) by mouth every 6 (six) hours as needed for mild pain (or temp > 37.5 C (99.5 F)). 08/30/21   Josette Ade, MD  aspirin  81 MG chewable tablet Chew 1 tablet (81 mg total) by mouth daily. Patient not taking: Reported on 01/11/2024 08/30/21   Josette Ade, MD  Cholecalciferol (VITAMIN D3) 125 MCG (5000 UT) CAPS Take 1 capsule by mouth daily. Patient not taking: Reported on 01/11/2024    [provider]  lamoTRIgine  (LAMICTAL ) 25 MG tablet Take 50 mg by mouth daily. 04/02/21   [provider]  meloxicam  (MOBIC ) 15 MG tablet Take 1 tablet (15 mg total) by mouth daily. Patient not taking: Reported on 01/11/2024 07/23/21   Herlinda Belton B, FNP  ondansetron  (ZOFRAN -ODT) 4 MG disintegrating tablet Take 1 tablet (4 mg total) by mouth every 6 (six) hours as needed for nausea or vomiting. 09/06/21   Ward, Josette SAILOR, DO  PROVENTIL  HFA 108 (90 Base) MCG/ACT inhaler Inhale 2 puffs into the lungs every 4 (four) hours as needed. 08/09/21   [provider]    Physical Exam: Vitals:   01/11/24 1600 01/11/24 1630 01/11/24 1800 01/11/24 1824  BP: (!) 97/59 95/67 111/64   Pulse: 61 62 66   Resp: 17 12 18    Temp:    98.2 F (36.8 C)  TempSrc:    Oral  SpO2: 100% 100% 90%   Weight:        Constitutional: NAD, calm, comfortable Vitals:   01/11/24 1600 01/11/24 1630 01/11/24 1800 01/11/24 1824  BP: (!) 97/59 95/67 111/64   Pulse: 61 62 66   Resp: 17 12 18    Temp:    98.2 F (36.8 C)  TempSrc:    Oral  SpO2: 100% 100% 90%   Weight:       Eyes: PERRL, lids and conjunctivae normal ENMT: Mucous membranes are moist. Posterior pharynx clear of any exudate or lesions.Normal dentition.  Neck: normal, supple, no masses, no thyromegaly Respiratory: clear to auscultation bilaterally, no wheezing, no crackles. Normal respiratory effort. No accessory muscle  use.  Cardiovascular: Regular rate and rhythm, no murmurs / rubs / gallops. No extremity edema. 2+ pedal pulses. No carotid bruits.  Abdomen: no tenderness, no masses palpated. No hepatosplenomegaly. Bowel sounds positive.  Musculoskeletal: no clubbing / cyanosis. No joint deformity upper and lower extremities. Good ROM, no contractures. Normal muscle tone.  Skin: no rashes, lesions, ulcers. No induration Neurologic: CN 2-12 grossly intact. Sensation intact, DTR normal. Strength 5/5 in all 4.  Psychiatric: Normal judgment and insight. Alert and oriented x 3. Normal mood.    Labs on Admission: I have personally reviewed following labs and imaging studies  CBC: Recent Labs  Lab 01/11/24 1207  WBC 6.1  HGB 11.3*  HCT 34.9*  MCV 85.5  PLT 237   Basic Metabolic Panel: Recent Labs  Lab 01/11/24 1207  NA 142  K 3.2*  CL 111  CO2 20*  GLUCOSE 122*  BUN 20  CREATININE 1.40*  CALCIUM  8.2*   GFR: CrCl cannot be calculated (Unknown ideal weight.). Liver Function Tests: Recent Labs  Lab 01/11/24 1207  AST 25  ALT 9  ALKPHOS 74  BILITOT 1.5*  PROT 6.1*  ALBUMIN 3.2*   No results for input(s): LIPASE, AMYLASE in the last 168 hours. No results for input(s): AMMONIA in the last 168 hours. Coagulation Profile: Recent Labs  Lab 01/11/24 1438  INR 1.2   Cardiac Enzymes: No results for input(s): CKTOTAL, CKMB, CKMBINDEX, TROPONINI in the last 168 hours. BNP (last 3 results) No results for input(s): PROBNP in the last 8760 hours. HbA1C: No results for input(s): HGBA1C in the last 72 hours. CBG: No results for input(s): GLUCAP in the last 168 hours. Lipid Profile: No results for input(s): CHOL, HDL, LDLCALC, TRIG, CHOLHDL, LDLDIRECT in the last 72 hours. Thyroid  Function Tests: No results for input(s): TSH, T4TOTAL, FREET4, T3FREE, THYROIDAB in the last 72 hours. Anemia Panel: No results for input(s): VITAMINB12, FOLATE,  FERRITIN, TIBC, IRON, RETICCTPCT in the last 72 hours. Urine analysis:    Component Value Date/Time   COLORURINE YELLOW (A) 08/28/2021 1105   APPEARANCEUR CLEAR (A) 08/28/2021 1105   APPEARANCEUR CLEAR 02/05/2014 1325   LABSPEC 1.012 08/28/2021 1105   LABSPEC 1.030 02/05/2014 1325   PHURINE 5.0 08/28/2021 1105   GLUCOSEU NEGATIVE 08/28/2021 1105   GLUCOSEU NEGATIVE 02/05/2014 1325   HGBUR NEGATIVE 08/28/2021 1105   BILIRUBINUR NEGATIVE 08/28/2021 1105   BILIRUBINUR NEGATIVE 02/05/2014 1325   KETONESUR NEGATIVE 08/28/2021 1105   PROTEINUR NEGATIVE 08/28/2021 1105   NITRITE NEGATIVE 08/28/2021 1105   LEUKOCYTESUR SMALL (A) 08/28/2021 1105   LEUKOCYTESUR 3+ 02/05/2014 1325    Radiological Exams on Admission: CT Angio Chest PE W and/or Wo Contrast Result Date: 01/11/2024 CLINICAL DATA:  Pulmonary embolus suspected with high probability. Dizziness followed by fall. Low blood pressure. EXAM: CT ANGIOGRAPHY CHEST WITH CONTRAST TECHNIQUE: Multidetector CT imaging of the chest was performed using the standard protocol during bolus administration of intravenous contrast. Multiplanar CT image reconstructions and MIPs were obtained to evaluate the vascular anatomy. RADIATION DOSE REDUCTION: This exam was performed according to the departmental dose-optimization program which includes automated exposure control, adjustment of the mA and/or kV according to patient size and/or use of iterative reconstruction technique. CONTRAST:  70mL OMNIPAQUE  IOHEXOL  350 MG/ML SOLN COMPARISON:  Chest radiograph 01/11/2024 FINDINGS: Cardiovascular: Technically adequate study with good opacification of the central and segmental pulmonary arteries. No focal filling defects. No evidence of significant pulmonary embolus. Cardiac enlargement. No pericardial effusion. Normal caliber thoracic aorta. Scattered calcification in the aorta and coronary arteries. Mediastinum/Nodes: No enlarged mediastinal, hilar, or axillary  lymph nodes. Thyroid  gland, trachea, and esophagus demonstrate no significant findings. Lungs/Pleura: Lungs are clear.  No pleural effusion or pneumothorax. Upper Abdomen: Postoperative changes consistent with gastric bypass. Surgical absence of the gallbladder. No acute abnormalities. Musculoskeletal: Degenerative changes in the spine. No acute bony abnormalities. Review of the MIP images confirms the above findings. IMPRESSION: 1. No evidence of significant pulmonary embolus. 2. Cardiac enlargement.  No evidence of active pulmonary disease. Electronically Signed   By: Elsie Gravely M.D.   On: 01/11/2024 17:37   DG Chest Portable 1 View Result Date: 01/11/2024 CLINICAL DATA:  Dizziness. EXAM: PORTABLE CHEST 1 VIEW COMPARISON:  September 05, 2022. FINDINGS: Stable cardiomegaly. Both lungs are clear. The visualized skeletal structures are unremarkable. IMPRESSION: No active disease. Electronically Signed   By: Lynwood Landy Raddle M.D.   On: 01/11/2024 15:49   CT HEAD WO CONTRAST ( ) Result Date: 01/11/2024 EXAM: CT HEAD WITHOUT CONTRAST 01/11/2024 01:44:51 PM TECHNIQUE: CT of the head was performed without the administration of intravenous contrast. Automated exposure control, iterative reconstruction, and/or weight based adjustment of the mA/kV was utilized to reduce the radiation dose to as low as reasonably achievable. COMPARISON: 10/01/2021 CLINICAL HISTORY: Head trauma, minor (Age >= 65y). Pt arrived via EMS from home due to a fall. Pt sts that she was in the yard and started to feel dizzy when she fell. Per EMS their BP was in the 50's systolic when they arrived on scene. EMS gave pt has had a total of of LR. EMS was able to bring pt BP back up to 70 systolic. FINDINGS: BRAIN AND VENTRICLES: No acute hemorrhage. Gray-white differentiation is preserved. No hydrocephalus. No extra-axial collection. No mass effect or midline shift. ORBITS: No acute abnormality. SINUSES: No acute abnormality. SOFT TISSUES  AND SKULL: No acute soft tissue abnormality. No skull fracture. IMPRESSION: 1. No acute intracranial abnormality. Electronically signed by: Waddell Calk MD 01/11/2024 01:56 PM EDT RP Workstation: [id number]   CT Cervical Spine Wo Contrast Result Date: 01/11/2024 EXAM: CT CERVICAL SPINE WITHOUT CONTRAST 01/11/2024 01:44:51 PM TECHNIQUE: CT of the cervical spine was performed without the administration of intravenous contrast. Multiplanar reformatted images are provided for review. Automated exposure control, iterative reconstruction, and/or weight based adjustment of the mA/kV was utilized to reduce the radiation dose to as low as reasonably achievable. COMPARISON: None available. CLINICAL HISTORY: Neck trauma (Age >= 65y). Pt arrived via EMS from home due to a fall. Pt sts that she was in the yard and started to feel dizzy when she fell. Per EMS their BP was in the 50's systolic when they arrived on scene. EMS gave pt has had a total of of LR. EMS was able to bring pt BP back up to 70 systolic. FINDINGS: CERVICAL SPINE: BONES AND ALIGNMENT: No acute fracture or traumatic malalignment. There is a vertebral body hemangioma at T1. DEGENERATIVE CHANGES: There is disc space narrowing and endplate modeling at C5-6 in keeping with moderate degenerative disc disease. Asymmetric facet arthrosis on the right at C4-5 results in moderate right neural foraminal narrowing. Similar changes are noted on the left at C3-4. SOFT TISSUES: No prevertebral soft tissue swelling. The remaining neural foramina are widely patent. IMPRESSION: 1. No acute abnormality of the cervical spine related to the reported neck trauma. 2. Moderate degenerative disc disease at C5-6 with disc space narrowing and endplate modeling. 3. Asymmetric facet arthrosis at C4-5 (right) and C3-4 (left) resulting in moderate neural foraminal narrowing at these levels. Electronically signed by: Dorethia Molt MD 01/11/2024 01:56 PM EDT RP Workstation:  [id number]    EKG: Independently reviewed.  Sinus rhythm, prolonged QTc 546  Assessment/Plan Principal Problem:   NSTEMI (non-ST elevated myocardial infarction) [facility]) Active Problems:   Syncope  (please populate well all problems here in Problem List. (For example, if patient is on BP meds at home and you resume or decide to hold them, it is a problem that needs to be her. Same for CAD, COPD, HLD and so on)  Syncope Symptomatic hypotension Prolonged Qtc - Syncope likely multifactorial from probably worsening symptomatic hypotension as well as probably new onset of prolonged  QTc. - Hold off home BP meds, check orthostatic vital signs - Prolonged QTc is new compared to past EKG done last year.  Repeat EKG consistently showed prolonged QTc 546> 593.  Review of patient's medication found the patient was already on TCA of doxepin  and she was started on another TCA nortriptyline  2 days ago, which potentially make the QTc prolongation worse.  Discussed with the patient regarding benefits versus risks, patient agreed with holding off TCA tonight.  Recheck EKG tomorrow morning -Polypharmacy is also concerning as patient takes multiple pain medications including gabapentin , Celebrex  Requip  -Continue telemonitoring - Other DDx, check TSH and cortisol level.  Patient appears to have a borderline hypotension at baseline, we will hold off home BP meds for now.  No cough, no diarrhea or urinary symptoms, chest x-ray on/CT chest negative for acute infiltrates, UA is pending.  Will check lactic acid level, clinically low suspicion for sepsis.  Monitor off antibiotics.  Low suspicion for seizure.  Elevated troponins - No chest pains, - Pattern of troponin elevation is flat, implying demand ischemia likely secondary to hypotension. - Messaged cardiology for consult in the morning - Trend troponins - Continue heparin  drip tonight  Hypokalemia - P.o. replacement - Check magnesium  level, check phosphorus  level  AKI versus CKD - Probably prerenal secondary to hypotension, slight increase of creatinine level - Continue gentle hydration - Recheck BMP tomorrow  Polypharmacy - Multiple CNS medication identified, including gabapentin , phentermine , TCAs, hydroxyzine  and Requip .  Discussed with patient regarding minimum effective dosage, recommend patient discuss the issue with her PCP/neurology/psychiatry.  Bipolar disorder - As discussed above, we will hold off TCAs  Morbid obesity - Status post gastric bypass  Total time spent on patient care 75 minutes.  DVT prophylaxis: Heparin  drip Code Status: Full code Family Communication: Husband at bedside Disposition Plan: Patient sick with syncope polypharmacy and elevated troponins, requiring ACS medications and inpatient cardiology consultation, expect more than 2 midnight hospital stay Consults called: Messaged cardiology Admission status: PCU admit   Cort ONEIDA Mana MD [facility] Pager [phone]  01/11/2024, 7:06 PM

## 2024-01-11 NOTE — ED Provider Notes (Signed)
 [facility] Provider Note    Event Date/Time   First MD Initiated Contact with Patient 01/11/24 1205     (approximate)  History   Chief Complaint: Fall  HPI  Ann Bowen is a 68 y.o. female with a past medical history of arthritis, borderline personality, fibromyalgia, presents to the emergency department after syncopal episode.  According to EMS report and the patient she was outside walking to her car next thing she remembers is being on the ground.  Patient states she has been feeling dizzy and lightheaded all day today.  EMS states a neighbor passing by saw her down and called EMS.  EMS states her initial blood pressure was in the 50s systolic when they got to her they gave her 500 cc of LR en route to the hospital.  Here patient is awake alert oriented.  Patient denies any complaints besides feeling fatigued.  Patient states she has passed out once previously.  Patient denies any chest pain.  Denies any recent illnesses cough congestion nausea vomiting diarrhea or fever.  Physical Exam   Triage Vital Signs: ED Triage Vitals  Encounter Vitals Group     BP --      Girls Systolic BP Percentile --      Girls Diastolic BP Percentile --      Boys Systolic BP Percentile --      Boys Diastolic BP Percentile --      Pulse --      Resp --      Temp 01/11/24 1206 98.6 F (37 C)     Temp Source 01/11/24 1206 Oral     SpO2 --      Weight --      Height --      Head Circumference --      Peak Flow --      Pain Score 01/11/24 1205 6     Pain Loc --      Pain Education --      Exclude from Growth Chart --     Most recent vital signs: Vitals:   01/11/24 1206  Temp: 98.6 F (37 C)    General: Awake, no distress.  CV:  Good peripheral perfusion.  Regular rate and rhythm  Resp:  Normal effort.  Equal breath sounds bilaterally.  Abd:  No distention.  Soft, nontender.  No rebound or guarding.  ED Results / Procedures / Treatments   EKG  EKG viewed  and interpreted by myself shows a normal sinus rhythm at 62 bpm with a narrow QRS, normal axis, largely normal intervals with nonspecific ST changes.  Patient does have what appears to be inferolateral T wave inversions.  Which appear new from prior EKG 09/05/2022.  RADIOLOGY  I have reviewed and interpreted the CT scan of the head.  No bleed seen on my evaluation. Radiology is read the CT scan as negative. CT cervical spine is negative for acute fracture.  MEDICATIONS ORDERED IN ED: Medications  sodium chloride  0.9 % bolus 500 mL (has no administration in time range)     IMPRESSION / MDM / ASSESSMENT AND PLAN / ED COURSE  I reviewed the triage vital signs and the nursing notes.  Patient's presentation is most consistent with acute presentation with potential threat to life or bodily function.  Patient presents to the emergency department after syncopal episode at home with presumed fall to the ground.  Currently patient is awake alert she is answering questions appropriately.  Does not  recall the details of the event but does remember feeling dizzy and lightheaded this morning.  We will check labs we will IV hydrate with additional 500 cc of fluid.  Will check an EKG cardiac enzymes.  Given the likelihood of an unconscious fall we will obtain CT imaging of the head and C-spine as a precaution.  Patient is complaining of some generalized aches but denies any focal pain anywhere in her body.  Patient's lab work has resulted showing a reassuring CBC slight renal insufficiency otherwise no significant findings on chemistry.  Patient's troponin however is elevated to 337.  Patient remains hypotensive currently 86 systolic on my evaluation.  Patient receiving a liter of fluid (is already received 1 L prior to this including EMSs fluids).  Given the EKG changes and elevated troponin we will order IV heparin .  Given the patient's hypotension and elevated troponin we will obtain a CTA of the chest to  further evaluate.  Patient satting well in the upper 90s on room air.  Blood pressure improving currently in the 90s systolic.  Patient states that she has been feeling very weak and fatigued for several weeks almost a month now.  Patient possibly could have had a cardiac event weeks ago.  CTA is pending.  Patient will required mission to the hospital service.  Patient's blood pressures once again decreased somewhat currently 84/48 with a MAP of 57.  Will start the patient on norepinephrine  while awaiting further results.  Patient care signed out to oncoming provider CTA pending repeat troponin pending    CRITICAL CARE Performed by: Franky Moores   Total critical care time: 45 minutes  Critical care time was exclusive of separately billable procedures and treating other patients.  Critical care was necessary to treat or prevent imminent or life-threatening deterioration.  Critical care was time spent personally by me on the following activities: development of treatment plan with patient and/or surrogate as well as nursing, discussions with consultants, evaluation of patient's response to treatment, examination of patient, obtaining history from patient or surrogate, ordering and performing treatments and interventions, ordering and review of laboratory studies, ordering and review of radiographic studies, pulse oximetry and re-evaluation of patient's condition.   FINAL CLINICAL IMPRESSION(S) / ED DIAGNOSES   Syncope Elevated troponin NSTEMI  Note:  This document was prepared using Dragon voice recognition software and may include unintentional dictation errors.   Moores Franky, MD 01/11/24 [phone]

## 2024-01-11 NOTE — ED Notes (Signed)
MD notified of pt BP 

## 2024-01-11 NOTE — ED Notes (Signed)
 Floor made aware of pt's arrival to unit

## 2024-01-11 NOTE — Plan of Care (Signed)
  Problem: Cardiac: Goal: Will achieve and/or maintain adequate cardiac output Outcome: Progressing   Problem: Clinical Measurements: Goal: Ability to maintain clinical measurements within normal limits will improve Outcome: Progressing   Problem: Pain Managment: Goal: General experience of comfort will improve and/or be controlled Outcome: Progressing   Problem: Safety: Goal: Ability to remain free from injury will improve Outcome: Progressing

## 2024-01-11 NOTE — ED Triage Notes (Signed)
 Pt arrived via EMS from home due to a fall. Pt sts that she was in the yard and started to feel dizzy when she fell. Per EMS their BP was in the 50's systolic when they arrived on scene. EMS gave pt has had a total of of LR. EMS was able to bring pt BP back up to 70 systolic.

## 2024-01-11 NOTE — Consult Note (Signed)
 PHARMACY - ANTICOAGULATION CONSULT NOTE  Pharmacy Consult for IV Heparin  Indication: chest pain/ACS  Allergies  Allergen Reactions   Wellbutrin  [Bupropion ] Hives and Rash    Patient Measurements: Weight: 97.5 kg (215 lb) HDW: 75 kg  Vital Signs: Temp: 98.6 F (37 C) (08/28 1206) Temp Source: Oral (08/28 1206) BP: 69/57 (08/28 1300) Pulse Rate: 64 (08/28 1300)  Labs: Recent Labs    01/11/24 1207  HGB 11.3*  HCT 34.9*  PLT 237  CREATININE 1.40*  TROPONINIHS 337*    CrCl cannot be calculated (Unknown ideal weight.).   Medical History: Past Medical History:  Diagnosis Date   Arthritis    Borderline personality disorder (HCC)    Depression    Fibromyalgia    Shingles     Medications:  No anticoagulation prior to admission per my chart review. Medication reconciliation is pending  Assessment: 68 y/o F with medical history as above presenting with fall and dizziness. Troponin elevated. Pharmacy consulted to manage heparin  for ACS.  Baseline aPTT and INR are pending. H&H notable for anemia c/w baseline.  Goal of Therapy:  Heparin  level 0.3-0.7 units/ml Monitor platelets by anticoagulation protocol: Yes   Plan:  --Heparin  4000 unit IV bolus followed by continuous infusion at 1000 units/hr --Heparin  level 6 hours from start --Daily CBC per protocol while on IV heparin   Ann Bowen 01/11/2024,2:05 PM

## 2024-01-11 NOTE — Consult Note (Signed)
 PHARMACY - ANTICOAGULATION CONSULT NOTE  Pharmacy Consult for IV Heparin  Indication: chest pain/ACS  Allergies  Allergen Reactions   Wellbutrin  [Bupropion ] Hives and Rash    Patient Measurements: Weight: 97.5 kg (215 lb) HDW: 75 kg  Vital Signs: Temp: 97.8 F (36.6 C) (08/28 1945) Temp Source: Oral (08/28 1945) BP: 148/99 (08/28 1945) Pulse Rate: 78 (08/28 1945)  Labs: Recent Labs    01/11/24 1207 01/11/24 1438 01/11/24 2123  HGB 11.3*  --   --   HCT 34.9*  --   --   PLT 237  --   --   APTT  --  35  --   LABPROT  --  15.7*  --   INR  --  1.2  --   HEPARINUNFRC  --  <0.10* <0.10*  CREATININE 1.40*  --   --   CKTOTAL 79  --   --   TROPONINIHS 337* 239*  --     CrCl cannot be calculated (Unknown ideal weight.).   Medical History: Past Medical History:  Diagnosis Date   Arthritis    Borderline personality disorder (HCC)    Depression    Fibromyalgia    Shingles     Medications:  No anticoagulation prior to admission per my chart review. Medication reconciliation is pending  Assessment: 68 y/o F with medical history as above presenting with fall and dizziness. Troponin elevated. Pharmacy consulted to manage heparin  for ACS.  Baseline aPTT and INR are pending. H&H notable for anemia c/w baseline.  Goal of Therapy:  Heparin  level 0.3-0.7 units/ml Monitor platelets by anticoagulation protocol: Yes  8/28@2123 : HL<0.10, subtherapeutic; 1000 units/hr(confirmed with nurse, no interruptions in infusion line)   Plan:  --Heparin  2200 unit IV bolus  --Increase continuous heparin  infusion at 1250 units/hr --Heparin  level 6 hours after rate change --Daily CBC per protocol while on IV heparin   Elcie Pelster A Juvia Aerts, PharmD Clinical Pharmacist 01/11/2024 11:20 PM

## 2024-01-11 NOTE — ED Provider Notes (Signed)
 Clinical Course as of 01/11/24 1903  Thu Jan 11, 2024  1459 Received signout. Walking out to car. Feeling weak, syncopized. Hypotensive. Received a total of 2L (500 via EMS). Troponin elevated. CTA chest. Pending. PMH htn. Will need admission. []  CTA chest, admit.   [HD]  1516 Troponin I (High Sensitivity)(!!): 337 [HD]  1516 Now on levophed  per previous physician [HD]  1544 Troponin I (High Sensitivity)(!!): 239 Downtrending [HD]  1653 Patient reevaluated, resting comfortably.  Denies chest pain at this time.  Talked with CT and delay secondary to not having an angio IV.  Nurse updated [HD]  8190 CT Angio Chest PE W and/or Wo Contrast CT angio chest unremarkable.  Patient resting comfortably.  Will discuss admission to the hospital [HD]  1824 Case discussed with hospitalist for admission [HD]    Clinical Course User Index [HD] Nicholaus Rolland BRAVO, MD   Admitting diagnoses: Syncope, elevated troponins, NSTEMI  .Critical Care  Performed by: Nicholaus Rolland BRAVO, MD Authorized by: Nicholaus Rolland BRAVO, MD   Critical care provider statement:    Critical care time (minutes):  30   Critical care was necessary to treat or prevent imminent or life-threatening deterioration of the following conditions:  Circulatory failure   Critical care was time spent personally by me on the following activities:  Development of treatment plan with patient or surrogate, discussions with consultants, evaluation of patient's response to treatment, examination of patient, ordering and review of laboratory studies, ordering and review of radiographic studies, ordering and performing treatments and interventions, pulse oximetry, re-evaluation of patient's condition and review of old charts Comments:     Nstemi, frequent evaluations, continued management of heparin  drip     Nicholaus Rolland BRAVO, MD 01/11/24 1904

## 2024-01-12 ENCOUNTER — Inpatient Hospital Stay
Admit: 2024-01-12 | Discharge: 2024-01-12 | Disposition: A | Discharge status: Home / Self Care | Attending: Internal Medicine | Admitting: Internal Medicine

## 2024-01-12 DIAGNOSIS — E876 Hypokalemia: Secondary | ICD-10-CM

## 2024-01-12 DIAGNOSIS — R55 Syncope and collapse: Secondary | ICD-10-CM

## 2024-01-12 DIAGNOSIS — R7989 Other specified abnormal findings of blood chemistry: Secondary | ICD-10-CM

## 2024-01-12 DIAGNOSIS — I214 Non-ST elevation (NSTEMI) myocardial infarction: Secondary | ICD-10-CM

## 2024-01-12 DIAGNOSIS — I4581 Long QT syndrome: Secondary | ICD-10-CM

## 2024-01-12 DIAGNOSIS — I959 Hypotension, unspecified: Secondary | ICD-10-CM | POA: Diagnosis present

## 2024-01-12 LAB — CBC
HCT: 35 % — ABNORMAL LOW (ref 36.0–46.0)
Hemoglobin: 11.5 g/dL — ABNORMAL LOW (ref 12.0–15.0)
MCH: 28.5 pg (ref 26.0–34.0)
MCHC: 32.9 g/dL (ref 30.0–36.0)
MCV: 86.6 fL (ref 80.0–100.0)
Platelets: 221 K/uL (ref 150–400)
RBC: 4.04 MIL/uL (ref 3.87–5.11)
RDW: 14.8 % (ref 11.5–15.5)
WBC: 7 K/uL (ref 4.0–10.5)
nRBC: 0 % (ref 0.0–0.2)

## 2024-01-12 LAB — BASIC METABOLIC PANEL WITH GFR
Anion gap: 4 — ABNORMAL LOW (ref 5–15)
BUN: 15 mg/dL (ref 8–23)
CO2: 23 mmol/L (ref 22–32)
Calcium: 7.9 mg/dL — ABNORMAL LOW (ref 8.9–10.3)
Chloride: 114 mmol/L — ABNORMAL HIGH (ref 98–111)
Creatinine, Ser: 1.07 mg/dL — ABNORMAL HIGH (ref 0.44–1.00)
GFR, Estimated: 56 mL/min — ABNORMAL LOW (ref >60)
Glucose, Bld: 107 mg/dL — ABNORMAL HIGH (ref 70–99)
Potassium: 3.8 mmol/L (ref 3.5–5.1)
Sodium: 141 mmol/L (ref 135–145)

## 2024-01-12 LAB — LIPID PANEL
Cholesterol: 146 mg/dL (ref 0–200)
HDL: 34 mg/dL — ABNORMAL LOW (ref >40)
LDL Cholesterol: 85 mg/dL (ref 0–99)
Total CHOL/HDL Ratio: 4.3 ratio
Triglycerides: 134 mg/dL (ref <150)
VLDL: 27 mg/dL (ref 0–40)

## 2024-01-12 LAB — ECHOCARDIOGRAM COMPLETE
Area-P 1/2: 3.97 cm2
Height: 63 in
S' Lateral: 2.26 cm
Weight: 3460.34 [oz_av]

## 2024-01-12 LAB — HEPARIN LEVEL (UNFRACTIONATED)
Heparin Unfractionated: 0.33 [IU]/mL (ref 0.30–0.70)
Heparin Unfractionated: 0.2 [IU]/mL — ABNORMAL LOW (ref 0.30–0.70)
Heparin Unfractionated: 0.24 [IU]/mL — ABNORMAL LOW (ref 0.30–0.70)

## 2024-01-12 LAB — GLUCOSE, CAPILLARY: Glucose-Capillary: 106 mg/dL — ABNORMAL HIGH (ref 70–99)

## 2024-01-12 LAB — HIV ANTIBODY (ROUTINE TESTING W REFLEX): HIV Screen 4th Generation wRfx: NONREACTIVE

## 2024-01-12 LAB — TROPONIN I (HIGH SENSITIVITY): Troponin I (High Sensitivity): 466 ng/L (ref <18)

## 2024-01-12 MED ORDER — HEPARIN BOLUS VIA INFUSION
1100.0000 [IU] | Freq: Once | INTRAVENOUS | Status: AC
  Administered 2024-01-12: 1100 [IU] via INTRAVENOUS
  Filled 2024-01-12: qty 1100

## 2024-01-12 MED ORDER — HEPARIN (PORCINE) 25000 UT/250ML-% IV SOLN
1500.0000 [IU]/h | INTRAVENOUS | Status: DC
  Administered 2024-01-12 – 2024-01-13 (×2): 1500 [IU]/h via INTRAVENOUS
  Filled 2024-01-12: qty 250

## 2024-01-12 MED ORDER — LIDOCAINE 5 % EX PTCH
1.0000 | MEDICATED_PATCH | CUTANEOUS | Status: DC
  Administered 2024-01-12: 1 via TRANSDERMAL
  Filled 2024-01-12: qty 1

## 2024-01-12 MED ORDER — HYDRALAZINE HCL 20 MG/ML IJ SOLN
10.0000 mg | Freq: Four times a day (QID) | INTRAMUSCULAR | Status: DC | PRN
  Administered 2024-01-12 (×2): 10 mg via INTRAVENOUS
  Filled 2024-01-12 (×2): qty 1

## 2024-01-12 MED ORDER — POTASSIUM CHLORIDE CRYS ER 20 MEQ PO TBCR
40.0000 meq | EXTENDED_RELEASE_TABLET | Freq: Once | ORAL | Status: AC
  Administered 2024-01-12: 40 meq via ORAL
  Filled 2024-01-12: qty 2

## 2024-01-12 MED ORDER — COSYNTROPIN 0.25 MG IJ SOLR
0.2500 mg | Freq: Once | INTRAMUSCULAR | Status: AC
  Administered 2024-01-13: 0.25 mg via INTRAVENOUS
  Filled 2024-01-12: qty 0.25

## 2024-01-12 NOTE — Consult Note (Signed)
 PHARMACY - ANTICOAGULATION CONSULT NOTE  Pharmacy Consult for IV Heparin  Indication: chest pain/ACS  Allergies  Allergen Reactions   Wellbutrin  [Bupropion ] Hives and Rash    Patient Measurements: Height: 5' 3 (160 cm) Weight: 98.1 kg (216 lb 4.3 oz) IBW/kg (Calculated) : 52.4 HEPARIN  DW (KG): 75.2  Vital Signs: Temp: 97.8 F (36.6 C) (08/29 1134) Temp Source: Oral (08/29 0439) BP: 181/80 (08/29 1134) Pulse Rate: 80 (08/29 1134)  Labs: Recent Labs    01/11/24 1207 01/11/24 1438 01/11/24 1438 01/11/24 2123 01/12/24 0454 01/12/24 1029  HGB 11.3*  --   --   --  11.5*  --   HCT 34.9*  --   --   --  35.0*  --   PLT 237  --   --   --  221  --   APTT  --  35  --   --   --   --   LABPROT  --  15.7*  --   --   --   --   INR  --  1.2  --   --   --   --   HEPARINUNFRC  --  <0.10*   < > <0.10* 0.33 0.20*  CREATININE 1.40*  --   --   --  1.07*  --   CKTOTAL 79  --   --   --   --   --   TROPONINIHS 337* 239*  --   --  466*  --    < > = values in this interval not displayed.    Estimated Creatinine Clearance: 55.4 mL/min (A) (by C-G formula based on SCr of 1.07 mg/dL (H)).  Medical History: Past Medical History:  Diagnosis Date   Arthritis    Borderline personality disorder (HCC)    Depression    Fibromyalgia    Shingles    Medications:  No anticoagulation prior to admission per my chart review. Medication reconciliation is pending  Assessment: 68 y/o F with medical history as above presenting with fall and dizziness. Troponin elevated. Pharmacy consulted to manage heparin  for ACS.  Date Time Results Comments 8/28 2123 HL<0.10 Subtherapeutic, rate 1000 units/hr 8/29 0454 HL 0.33 therapeutic x 1, rate 1250 units/hr 8/29 1029 HL 0.20 Subtherapeutic, rate 12050 units/hr   Goal of Therapy:  Heparin  level 0.3-0.7 units/ml Monitor platelets by anticoagulation protocol: Yes  Plan:  HL subtherapeutic, order heparin  1100 units IV bolus x1 Increase heparin  infusion  rate to 1400 units/hr Repeat HL 6 hours after rate change Daily CBC per protocol while on IV heparin   Abra Lingenfelter Rodriguez-Guzman PharmD, BCPS 01/12/2024 11:58 AM

## 2024-01-12 NOTE — Consult Note (Signed)
 PHARMACY - ANTICOAGULATION CONSULT NOTE  Pharmacy Consult for IV Heparin  Indication: chest pain/ACS  Allergies  Allergen Reactions   Wellbutrin  [Bupropion ] Hives and Rash    Patient Measurements: Height: 5' 3 (160 cm) Weight: 98.1 kg (216 lb 4.3 oz) IBW/kg (Calculated) : 52.4 HEPARIN  DW (KG): 75.2 HDW: 75 kg  Vital Signs: Temp: 97.9 F (36.6 C) (08/29 0439) Temp Source: Oral (08/29 0439) BP: 146/87 (08/29 0439) Pulse Rate: 83 (08/29 0439)  Labs: Recent Labs    01/11/24 1207 01/11/24 1438 01/11/24 2123 01/12/24 0454  HGB 11.3*  --   --  11.5*  HCT 34.9*  --   --  35.0*  PLT 237  --   --  221  APTT  --  35  --   --   LABPROT  --  15.7*  --   --   INR  --  1.2  --   --   HEPARINUNFRC  --  <0.10* <0.10* 0.33  CREATININE 1.40*  --   --   --   CKTOTAL 79  --   --   --   TROPONINIHS 337* 239*  --   --     Estimated Creatinine Clearance: 42.3 mL/min (A) (by C-G formula based on SCr of 1.4 mg/dL (H)).   Medical History: Past Medical History:  Diagnosis Date   Arthritis    Borderline personality disorder (HCC)    Depression    Fibromyalgia    Shingles     Medications:  No anticoagulation prior to admission per my chart review. Medication reconciliation is pending  Assessment: 68 y/o F with medical history as above presenting with fall and dizziness. Troponin elevated. Pharmacy consulted to manage heparin  for ACS.  Baseline aPTT and INR are pending. H&H notable for anemia c/w baseline.  Goal of Therapy:  Heparin  level 0.3-0.7 units/ml Monitor platelets by anticoagulation protocol: Yes  8/28@2123 : HL<0.10, subtherapeutic; 1000 units/hr(confirmed with nurse, no interruptions in infusion line) 8/29@0454 : HL 0.33, therapeutic x 1   Plan:  --Continue heparin  infusion at 1250 units/hr --Confirmatory heparin  level in 6 hours, then daily if therapeutic x 2  --Daily CBC per protocol while on IV heparin   Kikuye Korenek A Yalanda Soderman, PharmD Clinical  Pharmacist 01/12/2024 5:41 AM

## 2024-01-12 NOTE — Progress Notes (Addendum)
 Progress Note    Ann Bowen  FMW:[id number] DOB: 1955-07-28  DOA: 01/11/2024 PCP: Adina Buel HERO, MD      Brief Narrative:    Medical records reviewed and are as summarized below:  Ann Bowen is a 68 y.o. female  with medical history significant of fibromyalgia, bipolar disorder, insomnia, gastric bypass, chronic pain, HTN, borderline hypotension, who was brought to the hospital for evaluation of a syncopal episode.  On the morning of admission, she said she woke up feeling weird.  She went to her garden and felt lightheaded and then lost consciousness.  She says she was woken up by her neighbors to her passerby.  When EMS arrived, her systolic BP was in the 50s so she was given IV fluids ("Ringer's"  lactate) 500 mL bolus.  Of note, patient said her BP has been mostly less than 100 systolic in the past 2 months or so.  In the ED, initial heart rate 59, BP 75/46, respiratory rate 17, O2 sat 94%, temperature 98.6 F.  Labs significant for potassium of 3.2, BUN 20, creatinine 1.40, troponin 337, TSH 9.334  EKG showed prolonged QTc 584.       Assessment/Plan:   Principal Problem:   Syncope Active Problems:   Elevated troponin   Hypotension    Body mass index is 38.31 kg/m.   S/p syncope: Etiology unclear.  Could be related to hypotension. No PE on CT angiogram Avoid driving for minimum of 6 months until cleared by a physician   Hypotension: BP has improved with IV fluids.  No orthostatic hypotension. Cortisol level was 3.9. ACTH stimulation test has been ordered TSH 9.334, probable subclinical hypothyroidism Lactic acidosis: Resolved.  This was probably due to hypotension.  No evidence of sepsis thus far.   Hypertension: BP went up to 187/91 this morning.  Will be cautious with antihypertensives at this time given recent hypotension.   Sinus bradycardia: Improved   Elevated troponins: This is likely from demand ischemia. Troponin trend troponin  [phone]. Continue IV heparin  drip and monitor heparin  level per protocol until further recommendations from cardiologist. 2D echo showed EF estimated at 60 to 65%, normal LV diastolic parameters, mild MR LDL 85 Hemoglobin A1c pending   Prolonged QTc interval: Probably multifactorial from hypokalemia and antidepressants. Improved from 523 to 524.  Monitor after repletion of potassium  Doxepin , hydroxyzine , nortriptyline  and Requip  have been held   Hypokalemia: Improved.  Continue potassium repletion.   AKI: Improved   Bipolar disorder: She was on doxepin , hydroxyzine , nortriptyline (Recently started a few days prior to admission),     Diet Order             Diet Heart Room service appropriate? Yes; Fluid consistency: Thin  Diet effective now                                  Consultants: Cardiologist none  Procedures: None    Medications:    [START ON 01/13/2024] cosyntropin   0.25 mg Intravenous Once   gabapentin   300 mg Oral QHS   levothyroxine   50 mcg Oral Daily   sodium chloride  flush  3 mL Intravenous Q12H   Continuous Infusions:  heparin  1,400 Units/hr (01/12/24 1213)     Anti-infectives (From admission, onward)    None              Family Communication/Anticipated D/C date and plan/Code Status   DVT prophylaxis:  Code Status: Full Code  Family Communication: None Disposition Plan: Plan to discharge home   Status is: Inpatient Remains inpatient appropriate because: S/p syncope, workup in progress       Subjective:   Interval events noted.  She has no complaints.  She feels better.  She was able to ambulate to the bathroom without any assistance.  She did not have any symptoms with ambulation.  Nurse tech was at the bedside.    Objective:    Vitals:   01/12/24 0759 01/12/24 1134 01/12/24 1211 01/12/24 1328  BP: (!) 168/81 (!) 181/80 (!) 181/80 (!) 154/64  Pulse: 74 80  100  Resp: 17 18  20    Temp: 98 F (36.7 C) 97.8 F (36.6 C)    TempSrc:      SpO2: 96% 96%  100%  Weight:      Height:       Orthostatic VS for the past 24 hrs:  BP- Lying Pulse- Lying BP- Sitting Pulse- Sitting BP- Standing at 0 minutes Pulse- Standing at 0 minutes  01/12/24 1000 (!) 187/91 80 (!) 193/98 89 (!) 172/100 94  01/11/24 02-05-20 135/75 77 137/80 79 135/82 82     Intake/Output Summary (Last 24 hours) at 01/12/2024 1518 Last data filed at 01/12/2024 1402 Gross per 24 hour  Intake 383 ml  Output --  Net 383 ml   Filed Weights   01/11/24 1206 01/11/24 1945 01/12/24 0500  Weight: 97.5 kg 97.9 kg 98.1 kg    Exam:  GEN: NAD SKIN: Warm and dry EYES: No pallor or icterus ENT: MMM CV: RRR PULM: CTA B ABD: soft, ND, NT, +BS CNS: AAO x 3, non focal EXT: No edema or tenderness        Data Reviewed:   I have personally reviewed following labs and imaging studies:  Labs: Labs show the following:   Basic Metabolic Panel: Recent Labs  Lab 01/11/24 1206/02/04 01/12/24 0454  NA 142 141  K 3.2* 3.8  CL 111 114*  CO2 20* 23  GLUCOSE 122* 107*  BUN 20 15  CREATININE 1.40* 1.07*  CALCIUM  8.2* 7.9*  MG 2.0  --   PHOS 2.8  --    GFR Estimated Creatinine Clearance: 55.4 mL/min (A) (by C-G formula based on SCr of 1.07 mg/dL (H)). Liver Function Tests: Recent Labs  Lab 01/11/24 02/04/1206  AST 25  ALT 9  ALKPHOS 74  BILITOT 1.5*  PROT 6.1*  ALBUMIN 3.2*   No results for input(s): LIPASE, AMYLASE in the last 168 hours. No results for input(s): AMMONIA in the last 168 hours. Coagulation profile Recent Labs  Lab 01/11/24 1438  INR 1.2    CBC: Recent Labs  Lab 01/11/24 Feb 04, 1206 01/12/24 0454  WBC 6.1 7.0  HGB 11.3* 11.5*  HCT 34.9* 35.0*  MCV 85.5 86.6  PLT 237 221   Cardiac Enzymes: Recent Labs  Lab 01/11/24 02/04/1206  CKTOTAL 79   BNP (last 3 results) No results for input(s): PROBNP in the last 8760 hours. CBG: Recent Labs  Lab 01/12/24 0605  GLUCAP 106*    D-Dimer: No results for input(s): DDIMER in the last 72 hours. Hgb A1c: No results for input(s): HGBA1C in the last 72 hours. Lipid Profile: Recent Labs    01/12/24 1029  CHOL 146  HDL 34*  LDLCALC 85  TRIG 865  CHOLHDL 4.3   Thyroid  function studies: Recent Labs    01/11/24 Feb 04, 1206  TSH 9.334*   Anemia work up: No  results for input(s): VITAMINB12, FOLATE, FERRITIN, TIBC, IRON, RETICCTPCT in the last 72 hours. Sepsis Labs: Recent Labs  Lab 01/11/24 1207 01/11/24 1929 01/11/24 2123 01/11/24 2301 01/12/24 0454  WBC 6.1  --   --   --  7.0  LATICACIDVEN  --  2.0* 2.6* 1.2  --     Microbiology Recent Results (from the past 240 hours)  Resp panel by RT-PCR (RSV, Flu A&B, Covid) Anterior Nasal Swab     Status: None   Collection Time: 01/11/24 12:10 PM   Specimen: Anterior Nasal Swab  Result Value Ref Range Status   SARS Coronavirus 2 by RT PCR NEGATIVE NEGATIVE Final    Comment: (NOTE) SARS-CoV-2 target nucleic acids are NOT DETECTED.  The SARS-CoV-2 RNA is generally detectable in upper respiratory specimens during the acute phase of infection. The lowest concentration of SARS-CoV-2 viral copies this assay can detect is 138 copies/mL. A negative result does not preclude SARS-Cov-2 infection and should not be used as the sole basis for treatment or other patient management decisions. A negative result may occur with  improper specimen collection/handling, submission of specimen other than nasopharyngeal swab, presence of viral mutation(s) within the areas targeted by this assay, and inadequate number of viral copies(<138 copies/mL). A negative result must be combined with clinical observations, patient history, and epidemiological information. The expected result is Negative.  Fact Sheet for Patients:  BloggerCourse.com  Fact Sheet for Healthcare Providers:  SeriousBroker.it  This test is no t yet  approved or cleared by the [place]  FDA and  has been authorized for detection and/or diagnosis of SARS-CoV-2 by FDA under an Emergency Use Authorization (EUA). This EUA will remain  in effect (meaning this test can be used) for the duration of the COVID-19 declaration under Section 564(b)(1) of the Act, 21 U.S.C.section 360bbb-3(b)(1), unless the authorization is terminated  or revoked sooner.       Influenza A by PCR NEGATIVE NEGATIVE Final   Influenza B by PCR NEGATIVE NEGATIVE Final    Comment: (NOTE) The Xpert Xpress SARS-CoV-2/FLU/RSV plus assay is intended as an aid in the diagnosis of influenza from Nasopharyngeal swab specimens and should not be used as a sole basis for treatment. Nasal washings and aspirates are unacceptable for Xpert Xpress SARS-CoV-2/FLU/RSV testing.  Fact Sheet for Patients: BloggerCourse.com  Fact Sheet for Healthcare Providers: SeriousBroker.it  This test is not yet approved or cleared by the [place]  FDA and has been authorized for detection and/or diagnosis of SARS-CoV-2 by FDA under an Emergency Use Authorization (EUA). This EUA will remain in effect (meaning this test can be used) for the duration of the COVID-19 declaration under Section 564(b)(1) of the Act, 21 U.S.C. section 360bbb-3(b)(1), unless the authorization is terminated or revoked.     Resp Syncytial Virus by PCR NEGATIVE NEGATIVE Final    Comment: (NOTE) Fact Sheet for Patients: BloggerCourse.com  Fact Sheet for Healthcare Providers: SeriousBroker.it  This test is not yet approved or cleared by the [place]  FDA and has been authorized for detection and/or diagnosis of SARS-CoV-2 by FDA under an Emergency Use Authorization (EUA). This EUA will remain in effect (meaning this test can be used) for the duration of the COVID-19 declaration under Section 564(b)(1) of  the Act, 21 U.S.C. section 360bbb-3(b)(1), unless the authorization is terminated or revoked.  Performed at [facility], [address], [place], [place] [address]     Procedures and diagnostic studies:  ECHOCARDIOGRAM COMPLETE Result Date: 01/12/2024  ECHOCARDIOGRAM REPORT   Patient Name:   JANARI GAGNER Date of Exam: 01/12/2024 Medical Rec #:  [id number]    Height:       63.0 in Accession #:    [id number]   Weight:       216.3 lb Date of Birth:  11-24-1955    BSA:          1.999 m Patient Age:    68 years     BP:           181/80 mmHg Patient Gender: F            HR:           85 bpm. Exam Location:  [facility] Procedure: 2D Echo, Cardiac Doppler and Color Doppler (Both Spectral and Color            Flow Doppler were utilized during procedure). Indications:     Syncope  History:         Patient has prior history of Echocardiogram examinations, most                  recent 08/28/2021.  Sonographer:     Philomena Daring Referring Phys:  [id number] CORT ONEIDA MANA Diagnosing Phys: Marsa Dooms MD IMPRESSIONS  1. Left ventricular ejection fraction, by estimation, is 60 to 65%. The left ventricle has normal function. The left ventricle has no regional wall motion abnormalities. Left ventricular diastolic parameters were normal.  2. Right ventricular systolic function is normal. The right ventricular size is normal.  3. The mitral valve is normal in structure. Mild mitral valve regurgitation. No evidence of mitral stenosis.  4. The aortic valve is normal in structure. Aortic valve regurgitation is trivial. No aortic stenosis is present.  5. The inferior vena cava is normal in size with greater than 50% respiratory variability, suggesting right atrial pressure of 3 mmHg. FINDINGS  Left Ventricle: Left ventricular ejection fraction, by estimation, is 60 to 65%. The left ventricle has normal function. The left ventricle has no regional wall motion abnormalities. Strain was performed and the global longitudinal  strain is indeterminate. The left ventricular internal cavity size was normal in size. There is no left ventricular hypertrophy. Left ventricular diastolic parameters were normal. Right Ventricle: The right ventricular size is normal. No increase in right ventricular wall thickness. Right ventricular systolic function is normal. Left Atrium: Left atrial size was normal in size. Right Atrium: Right atrial size was normal in size. Pericardium: There is no evidence of pericardial effusion. Mitral Valve: The mitral valve is normal in structure. Mild mitral valve regurgitation. No evidence of mitral valve stenosis. Tricuspid Valve: The tricuspid valve is normal in structure. Tricuspid valve regurgitation is mild . No evidence of tricuspid stenosis. Aortic Valve: The aortic valve is normal in structure. Aortic valve regurgitation is trivial. No aortic stenosis is present. Pulmonic Valve: The pulmonic valve was normal in structure. Pulmonic valve regurgitation is not visualized. No evidence of pulmonic stenosis. Aorta: The aortic root is normal in size and structure. Venous: The inferior vena cava is normal in size with greater than 50% respiratory variability, suggesting right atrial pressure of 3 mmHg. IAS/Shunts: No atrial level shunt detected by color flow Doppler. Additional Comments: 3D was performed not requiring image post processing on an independent workstation and was indeterminate.  LEFT VENTRICLE PLAX 2D LVIDd:         3.46 cm   Diastology LVIDs:         2.26 cm  LV e' medial:    6.19 cm/s LV PW:         1.03 cm   LV E/e' medial:  24.7 LV IVS:        1.28 cm   LV e' lateral:   7.25 cm/s LVOT diam:     1.80 cm   LV E/e' lateral: 21.1 LV SV:         70 LV SV Index:   35 LVOT Area:     2.54 cm  RIGHT VENTRICLE             IVC RV S prime:     17.70 cm/s  IVC diam: 1.53 cm TAPSE (M-mode): 2.2 cm LEFT ATRIUM             Index        RIGHT ATRIUM           Index LA diam:        3.40 cm 1.70 cm/m   RA Area:      13.20 cm LA Vol (A2C):   53.2 ml 26.61 ml/m  RA Volume:   30.00 ml  15.01 ml/m LA Vol (A4C):   46.3 ml 23.16 ml/m LA Biplane Vol: 52.5 ml 26.26 ml/m  AORTIC VALVE LVOT Vmax:   146.00 cm/s LVOT Vmean:  95.300 cm/s LVOT VTI:    0.276 m  AORTA Ao Root diam: 2.30 cm Ao Asc diam:  3.60 cm MITRAL VALVE                TRICUSPID VALVE MV Area (PHT): 3.97 cm     TR Peak grad:   25.4 mmHg MV Decel Time: 191 msec     TR Vmax:        252.00 cm/s MV E velocity: 153.00 cm/s MV A velocity: 134.00 cm/s  SHUNTS MV E/A ratio:  1.14         Systemic VTI:  0.28 m                             Systemic Diam: 1.80 cm Marsa Dooms MD Electronically signed by Marsa Dooms MD Signature Date/Time: 01/12/2024/12:53:51 PM    Final    CT Angio Chest PE W and/or Wo Contrast Result Date: 01/11/2024 CLINICAL DATA:  Pulmonary embolus suspected with high probability. Dizziness followed by fall. Low blood pressure. EXAM: CT ANGIOGRAPHY CHEST WITH CONTRAST TECHNIQUE: Multidetector CT imaging of the chest was performed using the standard protocol during bolus administration of intravenous contrast. Multiplanar CT image reconstructions and MIPs were obtained to evaluate the vascular anatomy. RADIATION DOSE REDUCTION: This exam was performed according to the departmental dose-optimization program which includes automated exposure control, adjustment of the mA and/or kV according to patient size and/or use of iterative reconstruction technique. CONTRAST:  70mL OMNIPAQUE  IOHEXOL  350 MG/ML SOLN COMPARISON:  Chest radiograph 01/11/2024 FINDINGS: Cardiovascular: Technically adequate study with good opacification of the central and segmental pulmonary arteries. No focal filling defects. No evidence of significant pulmonary embolus. Cardiac enlargement. No pericardial effusion. Normal caliber thoracic aorta. Scattered calcification in the aorta and coronary arteries. Mediastinum/Nodes: No enlarged mediastinal, hilar, or axillary lymph nodes.  Thyroid  gland, trachea, and esophagus demonstrate no significant findings. Lungs/Pleura: Lungs are clear.  No pleural effusion or pneumothorax. Upper Abdomen: Postoperative changes consistent with gastric bypass. Surgical absence of the gallbladder. No acute abnormalities. Musculoskeletal: Degenerative changes in the spine. No acute bony abnormalities. Review of the MIP images confirms  the above findings. IMPRESSION: 1. No evidence of significant pulmonary embolus. 2. Cardiac enlargement.  No evidence of active pulmonary disease. Electronically Signed   By: Elsie Gravely M.D.   On: 01/11/2024 17:37   DG Chest Portable 1 View Result Date: 01/11/2024 CLINICAL DATA:  Dizziness. EXAM: PORTABLE CHEST 1 VIEW COMPARISON:  September 05, 2022. FINDINGS: Stable cardiomegaly. Both lungs are clear. The visualized skeletal structures are unremarkable. IMPRESSION: No active disease. Electronically Signed   By: Lynwood Landy Raddle M.D.   On: 01/11/2024 15:49   CT HEAD WO CONTRAST ( ) Result Date: 01/11/2024 EXAM: CT HEAD WITHOUT CONTRAST 01/11/2024 01:44:51 PM TECHNIQUE: CT of the head was performed without the administration of intravenous contrast. Automated exposure control, iterative reconstruction, and/or weight based adjustment of the mA/kV was utilized to reduce the radiation dose to as low as reasonably achievable. COMPARISON: 10/01/2021 CLINICAL HISTORY: Head trauma, minor (Age >= 65y). Pt arrived via EMS from home due to a fall. Pt sts that she was in the yard and started to feel dizzy when she fell. Per EMS their BP was in the 50's systolic when they arrived on scene. EMS gave pt has had a total of of LR. EMS was able to bring pt BP back up to 70 systolic. FINDINGS: BRAIN AND VENTRICLES: No acute hemorrhage. Gray-white differentiation is preserved. No hydrocephalus. No extra-axial collection. No mass effect or midline shift. ORBITS: No acute abnormality. SINUSES: No acute abnormality. SOFT TISSUES AND SKULL: No  acute soft tissue abnormality. No skull fracture. IMPRESSION: 1. No acute intracranial abnormality. Electronically signed by: Waddell Calk MD 01/11/2024 01:56 PM EDT RP Workstation: [id number]   CT Cervical Spine Wo Contrast Result Date: 01/11/2024 EXAM: CT CERVICAL SPINE WITHOUT CONTRAST 01/11/2024 01:44:51 PM TECHNIQUE: CT of the cervical spine was performed without the administration of intravenous contrast. Multiplanar reformatted images are provided for review. Automated exposure control, iterative reconstruction, and/or weight based adjustment of the mA/kV was utilized to reduce the radiation dose to as low as reasonably achievable. COMPARISON: None available. CLINICAL HISTORY: Neck trauma (Age >= 65y). Pt arrived via EMS from home due to a fall. Pt sts that she was in the yard and started to feel dizzy when she fell. Per EMS their BP was in the 50's systolic when they arrived on scene. EMS gave pt has had a total of of LR. EMS was able to bring pt BP back up to 70 systolic. FINDINGS: CERVICAL SPINE: BONES AND ALIGNMENT: No acute fracture or traumatic malalignment. There is a vertebral body hemangioma at T1. DEGENERATIVE CHANGES: There is disc space narrowing and endplate modeling at C5-6 in keeping with moderate degenerative disc disease. Asymmetric facet arthrosis on the right at C4-5 results in moderate right neural foraminal narrowing. Similar changes are noted on the left at C3-4. SOFT TISSUES: No prevertebral soft tissue swelling. The remaining neural foramina are widely patent. IMPRESSION: 1. No acute abnormality of the cervical spine related to the reported neck trauma. 2. Moderate degenerative disc disease at C5-6 with disc space narrowing and endplate modeling. 3. Asymmetric facet arthrosis at C4-5 (right) and C3-4 (left) resulting in moderate neural foraminal narrowing at these levels. Electronically signed by: Dorethia Molt MD 01/11/2024 01:56 PM EDT RP Workstation: [id number]                LOS: 1 day   Matalyn Nawaz  [facility]   Pager on [email or web address]. If 7PM-7AM, please contact night-coverage at [email or web address]     01/12/2024,  3:18 PM

## 2024-01-12 NOTE — Consult Note (Signed)
 PHARMACY - ANTICOAGULATION CONSULT NOTE  Pharmacy Consult for IV Heparin  Indication: chest pain/ACS  Allergies  Allergen Reactions   Wellbutrin  [Bupropion ] Hives and Rash    Patient Measurements: Height: 5' 3 (160 cm) Weight: 98.1 kg (216 lb 4.3 oz) IBW/kg (Calculated) : 52.4 HEPARIN  DW (KG): 75.2  Vital Signs: Temp: 98.6 F (37 C) (08/29 1958) Temp Source: Oral (08/29 1535) BP: 195/74 (08/29 1958) Pulse Rate: 88 (08/29 1958)  Labs: Recent Labs    01/11/24 1207 01/11/24 1438 01/11/24 2123 01/12/24 0454 01/12/24 1029 01/12/24 2029  HGB 11.3*  --   --  11.5*  --   --   HCT 34.9*  --   --  35.0*  --   --   PLT 237  --   --  221  --   --   APTT  --  35  --   --   --   --   LABPROT  --  15.7*  --   --   --   --   INR  --  1.2  --   --   --   --   HEPARINUNFRC  --  <0.10*   < > 0.33 0.20* 0.24*  CREATININE 1.40*  --   --  1.07*  --   --   CKTOTAL 79  --   --   --   --   --   TROPONINIHS 337* 239*  --  466*  --   --    < > = values in this interval not displayed.    Estimated Creatinine Clearance: 55.4 mL/min (A) (by C-G formula based on SCr of 1.07 mg/dL (H)).  Medical History: Past Medical History:  Diagnosis Date   Arthritis    Borderline personality disorder (HCC)    Depression    Fibromyalgia    Shingles    Medications:  No anticoagulation prior to admission per my chart review. Medication reconciliation is pending  Assessment: 68 y/o F with medical history as above presenting with fall and dizziness. Troponin elevated. Pharmacy consulted to manage heparin  for ACS.  Date Time Results Comments 8/28 2123 HL<0.10 Subtherapeutic, rate 1000 units/hr 8/29 0454 HL 0.33 therapeutic x 1, rate 1250 units/hr 8/29 1029 HL 0.20 Subtherapeutic, rate 1250 units/hr 8/29 2029 HL 0.24 Subtherapeutic, rate 1400 units/hr   Goal of Therapy:  Heparin  level 0.3-0.7 units/ml Monitor platelets by anticoagulation protocol: Yes  Plan:  HL subtherapeutic, order heparin   1100 units IV bolus x1 Increase heparin  infusion rate to 1500 units/hr Repeat HL 6 hours after rate change Daily CBC per protocol while on IV heparin   Annabella Banks PharmD,  01/12/2024 9:36 PM

## 2024-01-12 NOTE — TOC CM/SW Note (Signed)
 Transition of Care [facility]) - Inpatient Brief Assessment   Patient Details  Name: Ann Bowen MRN: [id number] Date of Birth: [birth year]  Transition of Care [facility]) CM/SW Contact:    Lauraine JAYSON Carpen, LCSW Phone Number: 01/12/2024, 3:24 PM   Clinical Narrative: CSW reviewed chart. SDOH flags for food, housing, utilities, and social isolation. Resources added to AVS. TOC consult for home health/DME needs. Per MD, she is ambulating with assistance. Will complete consult. No other TOC needs identified at this time. CSW will continue to follow progress. Please place another [facility] consult if any needs arise.  Transition of Care Asessment: Insurance and Status: Insurance coverage has been reviewed Patient has primary care physician: Yes Home environment has been reviewed: Single family home Prior level of function:: Not documented Prior/Current Home Services: No current home services Social Drivers of Health Review: SDOH reviewed interventions complete Readmission risk has been reviewed: Yes Transition of care needs: no transition of care needs at this time

## 2024-01-12 NOTE — Discharge Instructions (Signed)
 Food Resources  Agency Name: [facility] Agency Address: [address], [place], [place] [address] Phone: [phone] Website: [email or web address] Service(s) Offered: Housing services, self-sufficiency, congregate meal program, weatherization program, Event organiser program, emergency food assistance,  housing counseling, home ownership program, wheels - to work program.  [employer] free for 60 and older at various locations from [employer], Monday-Friday:  [employer], [address] [place], [phone] -[facility], [address], [place] [phone]  -[facility], [address], [place] [phone]  -[address], [place], [phone]  Agency Name: [facility] Address: [id number], [place], [place] [address] Phone: [phone] Website: [email or web address] Service(s) Offered: Home delivered hot, frozen, and emergency  meals. Grocery assistance program which matches  volunteers one-on-one with seniors unable to grocery shop  for themselves. Must be 60 years and older; less than 20  hours of in-home aide service, limited or no driving ability;  live alone or with someone with a disability; live in  [place].  Agency Name: [facility]) Address: [address], [place], [place] [address] Phone: [phone] Service(s) Offered: Food is served to shut-ins, homeless, elderly, and low income people in the community every Saturday (11:30 am-12:30 pm) and Sunday (12:30 pm-1:30pm). Volunteers also offer help and encouragement in seeking employment,  and spiritual guidance.  Agency Name: [facility] Address: [address] [place], [place] [address] Phone: [phone] Service(s) Offered: Child support services; child welfare services; food stamps; Medicaid; work first family assistance; and aid  with fuel,  rent, food and medicine.  Agency Name: [facility] Address: [address], [place], [place] Phone: [phone] Website: [email or web address] Services Offered: Monday 10:00am-12:00, 8:00pm-9:00pm, and Friday 10:00am-12:00.  Agency Name: [employer] [facility] [place] Address: [address] [place], [place] [address] Phone: [phone] Website: [email or web address] Service(s) Offered: Serves weekday meals, open from 11:30 am- 1:00 pm., and 6:30-7:30pm, Monday-Wednesday-Friday distributes food 3:30-6pm, Monday-Wednesday-Friday.  Agency Name: [facility] Address: [address], [place], [place] Phone: [phone] Website: [email or web address] Services Offered: Distributes food the 4th Saturday of the month, starting at 8:00 am  Agency Name: [facility] Address: [phone], [place], [place] [address] Phone: [phone] Website: [email or web address] Service(s) Offered: Bread of life, weekly food pantry. Open Wednesdays from 10:00am-noon.  Agency Name: [facility] [employer] Address: [address] [place], [place], [place] Phone: [phone] Services Offered: Distributes food 9am-1pm, Monday-Thursday. Call for details.  Agency Name: [facility] Address: [address], [place], [place] [address] Phone: [phone] Website: [email or web address] Service(s) Offered: Games developer. Call for assistance.  Agency Name: [facility] Address: [address] [place], [place] [address] Phone: [phone] Service Offered: Emergency Food Pantry. Call for appointment.  Agency Name: [facility] Address: [address] [place], [place] [address] Phone: [phone] Website: [email or web address] Services Offered: Games developer. Call for details  Agency Name: [facility] Address: [address] [place], [place] Phone:  [phone] Website: [email or web address] Service(s) Offered: Emergency Food Pantry. Call for details.  Agency Name: Holiday representative Address: [address], [place], [place] [address] Phone: [phone] or [phone] Website: [email or web address] Service(s) Offered: Distribute food 9am-11:30 am, Tuesday-Friday, and 1-3:30pm, Monday-Friday. Food pantry Monday-Friday 1pm-3pm, fresh items, Mon.-Wed.-Fri.  Agency Name: [facility] Address: [address] [place], [place] [address] Phone: [phone] Website: [email or web address] Services Offered: Distribute food Tues and Sats from 9:00am-noon.  Closed 1st Saturday of each month. Call for details  Agency Name: [employer] Address: Fayrene Boatman [facility] [address], [place] [address] Phone: [phone]  Services Offered: Delivers meals every Thursday   Rent/Utility/Housing  Agency Name: [facility] Agency Address: [address] [place], [place] [address] Phone: [phone] Email: [email or web address] Website: [email or web address] Service(s) Offered: Housing services, self-sufficiency, congregate meal program, weatherization program, Field seismologist program, emergency food assistance,  housing counseling, home ownership program, wheels -towork program.  Agency Name: [facility] Address: [address] [place], [place] [address] Phone: [phone] (8a-4p) [phone] (8p- 10p) Email: [email or web address] Website: [email or web address] Service(s) Offered: A program for homeless and/or needy men that includes one-on-one counseling, life skills training and job rehabilitation.  Agency Name: [employer] [facility] [place] Address: [address] [place], [place] [address] Phone: [phone] Website: www.[email or web address] Service(s) Offered: Assistance to needy in emergency with  utility bills, heating fuel, and prescriptions. Shelter for homeless 7pm-7am. September 08, 2016 15  Agency Name: [facility] [place] (Developmentally Disabled) Address: [address], [place], [place] [address] Phone: [phone] Contact Person: Lemond Cart Email: [email or web address] Website: [email or web address] Service(s) Offered: Helps individuals with developmental disabilities move from housing that is more restrictive to homes where they  can achieve greater independence and have more  opportunities.  Agency Name: [facility] Address: [address], [place], [place] [address] Phone: [phone] Email: [email or web address] Website: [email or web address] Service(s) Offered: Provides affordable housing for low-income families, elderly, and disabled individuals. Offer a wide range of  programs and services, from financial planning to afterschool and summer programs.  Agency Name: Department of Social Services Address: [address] [place], [place] [address] Phone: [phone] Service(s) Offered: Child support services; child welfare services; food stamps; Medicaid; work first family assistance; and aid with fuel,  rent, food and medicine.  Agency Name: [facility] [place] [employer]. Address: [facility] [address], [place], [place]  [address] Phone: [phone] Website: [email or web address].familyabuseservices.org Service(s) Offered: 24 hour Crisis Line: [phone]; 24 hour Emergency Shelter; Transitional Housing; Support Groups; Scientist, physiological; [employer]; Hispanic Outreach: [phone];  Visitation Center: [phone].  Agency Name: [facility] [place]. Address: [address] [place], [place] [address] Phone: [phone] Service(s) Offered: CAP Services; Home and [employer]; Individual or Group Supports; Respite Care Non-Institutional Nursing;  Residential Supports; Respite Care and Personal Care  Services; Transportation; Family and Friends Night; Recreational Activities; Three Nutritious Meals/Snacks; Consultation with Registered Dietician; Twenty-four hour Registered Nurse Access; Daily and [employer]; Camp Green Leaves; [place] for the [employer] (During Summer Months) Bingo Night (Every  Wednesday Night); Special Populations Dance Night  (Every Tuesday Night); Professional Hair Care Services.  Agency Name: [facility] Address: [address], [place], [place] [address] Phone: [phone] Contact Person: Meade High Website: [email or web address].Physicist, medical) Offered: Residential treatment facility for women; food and  clothing, educational & employment development and  transportation to work; Counsellor of financial skills;  parenting and family reunification; emotional and spiritual  support; transitional housing for program graduates.  Agency Name: [facility] Address: [address], [place], [place] [address] Phone: [phone] Email: [email or web address] Website: [email or web address] Service(s) Offered: Public housing units for elderly, disabled, and low income people; housing choice vouchers for income eligible  applicants; shelter plus care vouchers; and Psychologist, clinical.  Agency Name: [facility] [employer] Address: [address], [place], [place] [address] Phone: [phone] Email: [email or web address] Website: www.habitatalamance.org  Service(s) Offered: Build houses for families in need of decent housing. Each adult in the family must invest 200 hours of labor on  someone else's house, work with volunteers to build their own house, attend classes on budgeting, home maintenance, yard care, and attend homeowner association meetings.  Agency Name: [facility] Address: [address] [place], [place] [address] Phone: [phone] Website:  www.rsli.org Service(s) Offered: Intermediate care facilities for intellectually delayed, Supervised Living in group homes for adults with developmental disabilities, Supervised Living for people who have dual diagnoses (MRMI), Independent Living, Supported Living, respite and a variety of CAP services, pre-vocational services, day supports, and [employer].  Agency Name: [place] [facility] [facility] Phone: [phone] Website: www.NCForeclosurePrevention.gov Service(s) Offered: Zero-interest, deferred loans to homeowners struggling to pay their mortgage. Call for more information.   Agency Name: [facility] Agency Address: [address], [place], [place] [address] Phone: [phone] Website: www.[email or web address].[email or web address] Service(s) Offered: Housing services, self-sufficiency, congregate meal program, and individual development account program.  Agency Name: [facility] [place] Address: [address] [place], [place] [address] Phone: [phone] Email: [email or web address] Website: www.[email or web address].org Service(s) Offered: Housing the homeless, feeding the hungry, Company secretary, job and education related services.  Agency Name: [facility] Address: [address] [place], [place] [address] Phone: [phone] Email: [email or web address] Service(s) Offered: Counseling, problem pregnancy, advocacy for Hispanics, limited emergency financial assistance.  Agency Name: Department of Social Services Address: [address] [place], [place] [address] Phone: [phone] Website: [email or web address] Service(s) Offered: Child support services; child welfare services; SNAP; Medicaid; work first family assistance; and aid with fuel,  rent, food and medicine.  Agency Name: Holiday representative Address: [address] [place], [place] [address] Phone: [phone] or [phone] Email:  [email or web address] Service(s) Offered: Family services and transient assistance; emergency food, fuel, clothing, limited furniture, utilities; budget counseling, general counseling; give a kid a coat; thrift store; Christmas food and toys. Utility assistance, food pantry, rental  assistance, life sustaining medicine   Do you feel isolated?  The [facility] on Aging offers a [employer] that anyone can call toll free at [phone]. The friendship line is available 24 hours a day  [employer] is a Program of All-inclusive Care for the Elderly (PACE). Their mission is to promote and sustain the independence of seniors wishing to remain in the community. They provide seniors with comprehensive long-term health, social, medical and dietary care. Their program is a safe alternative to nursing home care. [phone]  [facility] Address [facility] [address] [place], [place] [address] Phone: [phone]. . Online zoom yoga class, connect with others without leaving your home [facility] offers [place] dance cardio sessions for individuals via Zoom. This program provides: - Dance fitness activities Please contact program for more information. Servinganyone in need adults 18+ hiv/aids individuals families Call [phone]  Email [email or web address] to get more info  [employer] offers an online [employer] to individuals where they can receive help to focus on their best health. Whether you're a [employer] member or not, the neighborhood center offers a... Main Serviceshealth education  exercise & fitness  community support services  recreation  virtual support Other Servicessupport groups Servinganyone in need adults young adults teens seniors individuals families [email or web address] to get more info  Schedule on their website  The [facility] offers an array of  activities for adults age 28 and over. This program provides:- Fitness and  health programs- Tech classes- Activity books Main Serviceshealth education  community support services  exercise & fitness  recreation  more education Servingseniors  Call [phone]    For more resources go online to [email or web address].co.uk and type in you zipcode

## 2024-01-12 NOTE — Plan of Care (Signed)

## 2024-01-12 NOTE — Consult Note (Signed)
 Cardiology Consultation:   Patient ID: Ann Bowen; [id number]; August 29, 1955   Admit date: 01/11/2024 Date of Consult: 01/12/2024  Primary Care Provider: Adina Buel HERO, MD Primary Cardiologist: New - consult by Darron Primary Electrophysiologist:  None   Patient Profile:   Ann Bowen is a 68 y.o. female with a hx of bipolar type I, bradycardia, fibromyalgia, anxiety, depression, prior gastric bypass, chronic pain syndrome, and insomnia who is being seen today for the evaluation of syncope with elevated troponin and abnormal EKG at the request of Dr. Jens.  History of Present Illness:   [name] was previously evaluated by outside cardiology group in 12/2020 for weakness, fatigue, confusion, and hypotension.  She was bradycardic in the 40s bpm at that time.  Echo showed an EF of 60 to 65%, no regional wall motion abnormalities, mild LVH, normal LV diastolic function parameters, normal RV systolic function and ventricular cavity size, and no significant valvular abnormalities.  She was seen by [facility] cardiology in 04/2022 during admission for influenza and UTI found to have elevated troponin and asymptomatic bradycardia.  Modification of pharmacotherapy was recommended including Zanaflex  and Requip .  Echo showed preserved LV systolic function with no regional wall motion abnormalities.  Elevated troponin in the 400 range was felt to be supply/demand ischemia and pharmacotherapy was felt to be contributing to her bradycardia.  She reports retiring from [employer] in 07/2023 and since then has been depressed, not going out as much as she previously did.  Previously was an avid climber and hiker.  She was admitted to [facility] on 01/11/2024 after suffering a syncopal episode while outdoors walking her dogs.  She reports despite sleeping well getting into 8/28 she was quite tired and fatigued throughout the day on 8/28.  Leading up to her syncopal episode she denies any chest pain, palpitations, dyspnea,  nausea, or vomiting.  No loss of bowel or bladder function.  She did not bite her tongue during the episode.  She does not recall a postictal timeframe.  Bystanders saw her on the ground and stopped to assist.  They contacted EMS.  Upon EMS arrival she was found to be hypotensive with blood pressure in the 50s mmHg systolic.  She was given 500 mL of normal saline with improvement in BP to the 70s mmHg upon arrival to [facility].  BP remained soft throughout the day on 8/28 in the ER, currently improved and hypertensive in the 160s mmHg systolic.  EKG showed sinus rhythm, 62 bpm, inferolateral T wave inversion with prolonged QTc measuring 584 ms.  Repeat EKG showed sinus rhythm, 78 bpm, improving inferior lateral T wave inversion and QTc 528 ms.  Telemetry has shown sinus rhythm without evidence of significant arrhythmia, prolonged pauses, or evidence of high-grade AV block.  High-sensitivity troponin 337 with a delta troponin to 39 currently trended to 466.  Labs notable for potassium 3.2 trending to 3.8, BUN 20, serum creatinine 1.4 with baseline around 0.7-0.8 trending to 1.07, calcium  8.2 trending to 7.9, Hgb 11.3 which is consistent with her baseline.  Review of prior to admission medications shows she currently takes doxepin , hydroxyzine , nortriptyline , Requip , and has Zofran  listed though does not take.  At time of cardiology consult she is without symptoms of angina or cardiac decompensation.  She continues to note significant fatigue.  She denies any accidental or intentional pharmacological overdose or ideas of SI/HI.    Past Medical History:  Diagnosis Date   Arthritis    Borderline personality disorder (HCC)  Depression    Fibromyalgia    Shingles     Past Surgical History:  Procedure Laterality Date   CHOLECYSTECTOMY     COLONOSCOPY N/A 01/14/2021   Procedure: COLONOSCOPY;  Surgeon: Jinny Carmine, MD;  Location: [facility];  Service: Endoscopy;  Laterality: N/A;  positive home test  12-11-20   COLONOSCOPY WITH PROPOFOL  N/A 01/01/2018   Procedure: COLONOSCOPY WITH PROPOFOL ;  Surgeon: Unk Corinn Skiff, MD;  Location: [facility];  Service: Gastroenterology;  Laterality: N/A;   GASTRIC BYPASS     SHOULDER SURGERY       Home Meds: Prior to Admission medications   Medication Sig Start Date End Date Taking? Authorizing Provider  celecoxib  (CELEBREX ) 200 MG capsule Take by mouth. 10/18/23  Yes [provider]  doxepin  (SINEQUAN ) 10 MG capsule 10 MG capsule Take 20 mg by mouth nightly 09/18/19  Yes [provider]  gabapentin  (NEURONTIN ) 300 MG capsule Take by mouth. 08/27/21  Yes [provider]  hydrOXYzine  (ATARAX ) 25 MG tablet Take 25 mg by mouth 4 (four) times daily. 10/13/23  Yes [provider]  levothyroxine  (SYNTHROID ) 50 MCG tablet Take 50 mcg by mouth daily. 10/19/23  Yes [provider]  losartan (COZAAR) 25 MG tablet Take 25 mg by mouth daily. 12/02/20  Yes [provider]  nortriptyline  (PAMELOR ) 25 MG capsule Take 25 mg by mouth 2 (two) times daily. 01/11/24  Yes [provider]  phentermine  30 MG capsule Take 30 mg by mouth daily.   Yes [provider]  rOPINIRole  (REQUIP ) 1 MG tablet Take 1 mg by mouth in the morning and at bedtime.    Yes [provider]  tiZANidine  (ZANAFLEX ) 4 MG tablet Take by mouth. 12/20/23  Yes [provider]  acetaminophen  (TYLENOL ) 325 MG tablet Take 2 tablets (650 mg total) by mouth every 6 (six) hours as needed for mild pain (or temp > 37.5 C (99.5 F)). 08/30/21   Josette Ade, MD  aspirin  81 MG chewable tablet Chew 1 tablet (81 mg total) by mouth daily. Patient not taking: Reported on 01/11/2024 08/30/21   Josette Ade, MD  Cholecalciferol (VITAMIN D3) 125 MCG (5000 UT) CAPS Take 1 capsule by mouth daily. Patient not taking: Reported on 01/11/2024    [provider]  lamoTRIgine  (LAMICTAL ) 25 MG tablet Take 50 mg by mouth daily. 04/02/21    [provider]  meloxicam  (MOBIC ) 15 MG tablet Take 1 tablet (15 mg total) by mouth daily. Patient not taking: Reported on 01/11/2024 07/23/21   Herlinda Belton B, FNP  ondansetron  (ZOFRAN -ODT) 4 MG disintegrating tablet Take 1 tablet (4 mg total) by mouth every 6 (six) hours as needed for nausea or vomiting. 09/06/21   Ward, Josette SAILOR, DO  PROVENTIL  HFA 108 (90 Base) MCG/ACT inhaler Inhale 2 puffs into the lungs every 4 (four) hours as needed. 08/09/21   [provider]    Inpatient Medications: Scheduled Meds:  [START ON 01/13/2024] cosyntropin   0.25 mg Intravenous Once   gabapentin   300 mg Oral QHS   levothyroxine   50 mcg Oral Daily   sodium chloride  flush  3 mL Intravenous Q12H   Continuous Infusions:  heparin  1,250 Units/hr (01/12/24 0802)   PRN Meds: acetaminophen , albuterol , celecoxib   Allergies:   Allergies  Allergen Reactions   Wellbutrin  [Bupropion ] Hives and Rash    Social History:   Social History   Socioeconomic History   Marital status: Married    Spouse name: Not on file  Number of children: Not on file   Years of education: Not on file   Highest education level: Not on file  Occupational History   Not on file  Tobacco Use   Smoking status: Never   Smokeless tobacco: Never  Vaping Use   Vaping status: Never Used  Substance and Sexual Activity   Alcohol  use: Yes    Comment: occasionaly   Drug use: Never   Sexual activity: Not on file  Other Topics Concern   Not on file  Social History Narrative   ** Merged History Encounter **       Social Drivers of Health   Financial Resource Strain: Not on file  Food Insecurity: Food Insecurity Present (01/11/2024)   Hunger Vital Sign    Worried About Running Out of Food in the Last Year: Often true    Ran Out of Food in the Last Year: Often true  Transportation Needs: No Transportation Needs (01/11/2024)   PRAPARE - Administrator, Civil Service (Medical): No    Lack of  Transportation (Non-Medical): No  Physical Activity: Not on file  Stress: Not on file  Social Connections: Socially Isolated (01/11/2024)   Social Connection and Isolation Panel    Frequency of Communication with Friends and Family: Once a week    Frequency of Social Gatherings with Friends and Family: Never    Attends Religious Services: Never    Database administrator or Organizations: No    Attends Banker Meetings: Never    Marital Status: Married  Catering manager Violence: Not At Risk (01/11/2024)   Humiliation, Afraid, Rape, and Kick questionnaire    Fear of Current or Ex-Partner: No    Emotionally Abused: No    Physically Abused: No    Sexually Abused: No     Family History:   Family History  Problem Relation Age of Onset   Diabetes Mellitus II Mother    Diabetes Mellitus II Father    Hypertension Father    Diabetes Mellitus II Sister    Breast cancer Neg Hx     ROS:  Review of Systems  Constitutional:  Positive for malaise/fatigue. Negative for chills, diaphoresis, fever and weight loss.  HENT:  Negative for congestion.   Eyes:  Negative for discharge and redness.  Respiratory:  Negative for cough, sputum production, shortness of breath and wheezing.   Cardiovascular:  Negative for chest pain, palpitations, orthopnea, claudication, leg swelling and PND.  Gastrointestinal:  Negative for abdominal pain, heartburn, nausea and vomiting.  Musculoskeletal:  Positive for falls. Negative for myalgias.  Skin:  Negative for rash.  Neurological:  Positive for loss of consciousness and weakness. Negative for dizziness, tingling, tremors, sensory change, speech change, focal weakness, seizures and headaches.  Endo/Heme/Allergies:  Does not bruise/bleed easily.  Psychiatric/Behavioral:  Negative for substance abuse. The patient is not nervous/anxious.   All other systems reviewed and are negative.     Physical Exam/Data:   Vitals:   01/11/24 2345 01/12/24 0439  01/12/24 0500 01/12/24 0759  BP: (!) 145/77 (!) 146/87  (!) 168/81  Pulse: 79 83  74  Resp: 18 18  17   Temp: 97.8 F (36.6 C) 97.9 F (36.6 C)  98 F (36.7 C)  TempSrc: Oral Oral    SpO2: 98% 98%  96%  Weight:   98.1 kg   Height:        Intake/Output Summary (Last 24 hours) at 01/12/2024 9096 Last data filed at 01/11/2024 2149  Gross per 24 hour  Intake 243 ml  Output --  Net 243 ml   Filed Weights   01/11/24 1206 01/11/24 1945 01/12/24 0500  Weight: 97.5 kg 97.9 kg 98.1 kg   Body mass index is 38.31 kg/m.   Physical Exam: General: Well developed, well nourished, in no acute distress. Head: Normocephalic, atraumatic, sclera non-icteric, no xanthomas, nares without discharge.  Neck: Negative for carotid bruits. JVD not elevated. Lungs: Clear bilaterally to auscultation without wheezes, rales, or rhonchi. Breathing is unlabored. Heart: RRR with S1 S2. I/VI systolic murmur, no rubs, or gallops appreciated. Abdomen: Soft, non-tender, non-distended with normoactive bowel sounds. No hepatomegaly. No rebound/guarding. No obvious abdominal masses. Msk:  Strength and tone appear normal for age. Extremities: No clubbing or cyanosis. No edema.  Neuro: Alert and oriented X 3. No facial asymmetry. No focal deficit. Moves all extremities spontaneously. Psych:  Responds to questions appropriately with a normal affect.   EKG:  The EKG was personally reviewed and demonstrates: sinus rhythm, 62 bpm, inferolateral T wave inversion with prolonged QTc measuring 584 ms.  Repeat EKG showed sinus rhythm, 78 bpm, improving inferior lateral T wave inversion and QTc 528 ms. Telemetry:  Telemetry was personally reviewed and demonstrates: Sinus rhythm with low voltage, no significant arrhythmia, prolonged pauses, or evidence of high-grade AV block  Weights: Filed Weights   01/11/24 1206 01/11/24 1945 01/12/24 0500  Weight: 97.5 kg 97.9 kg 98.1 kg    Relevant CV Studies: 2D echo 08/28/2021: 1. Left  ventricular ejection fraction, by estimation, is 60 to 65%. The  left ventricle has normal function. The left ventricle has no regional  wall motion abnormalities. There is mild left ventricular hypertrophy.  Left ventricular diastolic parameters  were normal.   2. Right ventricular systolic function is normal. The right ventricular  size is normal.   3. The mitral valve is normal in structure. No evidence of mitral valve  regurgitation. No evidence of mitral stenosis.   4. The aortic valve is calcified. Aortic valve regurgitation is not  visualized. No aortic stenosis is present.   5. The inferior vena cava is normal in size with greater than 50%  respiratory variability, suggesting right atrial pressure of 3 mmHg.   6. Agitated saline contrast bubble study was negative, with no evidence  of any interatrial shunt.   Laboratory Data:  Chemistry Recent Labs  Lab 01/11/24 1207 01/12/24 0454  NA 142 141  K 3.2* 3.8  CL 111 114*  CO2 20* 23  GLUCOSE 122* 107*  BUN 20 15  CREATININE 1.40* 1.07*  CALCIUM  8.2* 7.9*  GFRNONAA 41* 56*  ANIONGAP 11 4*    Recent Labs  Lab 01/11/24 1207  PROT 6.1*  ALBUMIN 3.2*  AST 25  ALT 9  ALKPHOS 74  BILITOT 1.5*   Hematology Recent Labs  Lab 01/11/24 1207 01/12/24 0454  WBC 6.1 7.0  RBC 4.08 4.04  HGB 11.3* 11.5*  HCT 34.9* 35.0*  MCV 85.5 86.6  MCH 27.7 28.5  MCHC 32.4 32.9  RDW 14.5 14.8  PLT 237 221   Cardiac EnzymesNo results for input(s): TROPONINI in the last 168 hours. No results for input(s): TROPIPOC in the last 168 hours.  BNPNo results for input(s): BNP, PROBNP in the last 168 hours.  DDimer No results for input(s): DDIMER in the last 168 hours.  Radiology/Studies:  CT Angio Chest PE W and/or Wo Contrast Result Date: 01/11/2024 IMPRESSION: 1. No evidence of significant pulmonary embolus. 2. Cardiac enlargement.  No evidence of active pulmonary disease. Electronically Signed   By: Elsie Gravely M.D.    On: 01/11/2024 17:37   DG Chest Portable 1 View Result Date: 01/11/2024 IMPRESSION: No active disease. Electronically Signed   By: Lynwood Landy Raddle M.D.   On: 01/11/2024 15:49   CT HEAD WO CONTRAST ( ) Result Date: 01/11/2024 IMPRESSION: 1. No acute intracranial abnormality. Electronically signed by: Waddell Calk MD 01/11/2024 01:56 PM EDT RP Workstation: [id number]   CT Cervical Spine Wo Contrast Result Date: 01/11/2024 IMPRESSION: 1. No acute abnormality of the cervical spine related to the reported neck trauma. 2. Moderate degenerative disc disease at C5-6 with disc space narrowing and endplate modeling. 3. Asymmetric facet arthrosis at C4-5 (right) and C3-4 (left) resulting in moderate neural foraminal narrowing at these levels. Electronically signed by: Dorethia Molt MD 01/11/2024 01:56 PM EDT RP Workstation: [id number]    Assessment and Plan:   1. Syncope: - Unclear etiology at this time with concerning findings including hypotension, abnormal EKG with new inferolateral T wave inversion and prolonged QTc -Monitor on telemetry; if no significant arrhythmia, prolonged pause, or evidence of high-grade AV block is identified on telemetry recommend prolonged outpatient cardiac monitoring at discharge - Echo - Has been maintained on doxepin  and was recently started on nortriptyline  prior to admission which may have contributed to presentation - Mildly dehydrated with mild AKI and hypotensive upon arrival  - Orthostatic vitals normal 8/28 - Discontinue QT prolonging medications - Per [place] guidelines, no driving for 6 months from date of last syncopal episode, or until cause has been identified and adequately treated   2. Abnormal EKG/prolonged QTc: - Avoid/stop QT prolonging medications - Monitor on telemetry - Repeat EKG  3. Elevated high sensitivity troponin: - Never with symptoms of chest pain - Mildly elevated and flat trending high-sensitivity troponin likely reflective of  supply/demand ischemia in the setting of profound hypotension/syncope - Reasonable to continue heparin  drip for now while echo is pending - Obtain echo with further recommendations regarding ischemic evaluation pending - ASA 81 mg - Check lipid panel and A1c for further risk stratification - Avoid beta-blocker with prior bradycardia-  4. Polypharmacy: - Patient is on multiple medications that can lead to prolonged QT and/or syncope including hydroxyzine , Requip , and Zofran ; these will be held at this time [facility] cardiology has previously recommended she have modification of some of her medications for asymptomatic bradycardia, including Requip  and Zanaflex  - Appreciate IM assistance in modifying patient's pharmacotherapy   5.  Hypokalemia: - Improved - Magnesium  at goal        For questions or updates, please contact [facility] Please consult [email or web address] for contact info under Cardiology/STEMI.   Signed, Bernardino Bring, PA-C [facility] Pager: [phone] 01/12/2024, 9:03 AM

## 2024-01-13 DIAGNOSIS — R9431 Abnormal electrocardiogram [ECG] [EKG]: Secondary | ICD-10-CM | POA: Insufficient documentation

## 2024-01-13 DIAGNOSIS — R55 Syncope and collapse: Secondary | ICD-10-CM | POA: Diagnosis not present

## 2024-01-13 DIAGNOSIS — I2510 Atherosclerotic heart disease of native coronary artery without angina pectoris: Secondary | ICD-10-CM

## 2024-01-13 DIAGNOSIS — R7989 Other specified abnormal findings of blood chemistry: Secondary | ICD-10-CM | POA: Diagnosis not present

## 2024-01-13 DIAGNOSIS — I10 Essential (primary) hypertension: Secondary | ICD-10-CM

## 2024-01-13 DIAGNOSIS — I4581 Long QT syndrome: Secondary | ICD-10-CM

## 2024-01-13 LAB — BASIC METABOLIC PANEL WITH GFR
Anion gap: 7 (ref 5–15)
BUN: 8 mg/dL (ref 8–23)
CO2: 23 mmol/L (ref 22–32)
Calcium: 9.1 mg/dL (ref 8.9–10.3)
Chloride: 111 mmol/L (ref 98–111)
Creatinine, Ser: 0.64 mg/dL (ref 0.44–1.00)
GFR, Estimated: >60 mL/min (ref >60)
Glucose, Bld: 114 mg/dL — ABNORMAL HIGH (ref 70–99)
Potassium: 3.8 mmol/L (ref 3.5–5.1)
Sodium: 141 mmol/L (ref 135–145)

## 2024-01-13 LAB — CBC
HCT: 37.8 % (ref 36.0–46.0)
Hemoglobin: 12.3 g/dL (ref 12.0–15.0)
MCH: 28 pg (ref 26.0–34.0)
MCHC: 32.5 g/dL (ref 30.0–36.0)
MCV: 85.9 fL (ref 80.0–100.0)
Platelets: 301 K/uL (ref 150–400)
RBC: 4.4 MIL/uL (ref 3.87–5.11)
RDW: 14.9 % (ref 11.5–15.5)
WBC: 8.8 K/uL (ref 4.0–10.5)
nRBC: 0 % (ref 0.0–0.2)

## 2024-01-13 LAB — GLUCOSE, CAPILLARY: Glucose-Capillary: 104 mg/dL — ABNORMAL HIGH (ref 70–99)

## 2024-01-13 LAB — ACTH STIMULATION, 3 TIME POINTS
Cortisol, 30 Min: 25.9 ug/dL
Cortisol, 60 Min: 29.4 ug/dL
Cortisol, Base: 10.7 ug/dL

## 2024-01-13 LAB — HEPARIN LEVEL (UNFRACTIONATED): Heparin Unfractionated: 0.4 [IU]/mL (ref 0.30–0.70)

## 2024-01-13 MED ORDER — POTASSIUM CHLORIDE CRYS ER 20 MEQ PO TBCR
20.0000 meq | EXTENDED_RELEASE_TABLET | Freq: Once | ORAL | Status: AC
  Administered 2024-01-13: 20 meq via ORAL
  Filled 2024-01-13: qty 1

## 2024-01-13 MED ORDER — ATORVASTATIN CALCIUM 20 MG PO TABS
40.0000 mg | ORAL_TABLET | Freq: Every day | ORAL | Status: DC
  Administered 2024-01-13: 40 mg via ORAL
  Filled 2024-01-13: qty 2

## 2024-01-13 MED ORDER — GABAPENTIN 300 MG PO CAPS: 300.0000 mg | ORAL_CAPSULE | Freq: Three times a day (TID) | ORAL | Status: AC | PRN

## 2024-01-13 MED ORDER — LOSARTAN POTASSIUM 25 MG PO TABS
25.0000 mg | ORAL_TABLET | Freq: Every day | ORAL | Status: DC
  Administered 2024-01-13: 25 mg via ORAL
  Filled 2024-01-13: qty 1

## 2024-01-13 NOTE — Plan of Care (Signed)
  Problem: Cardiac: Goal: Will achieve and/or maintain adequate cardiac output Outcome: Progressing   Problem: Education: Goal: Knowledge of General Education information will improve Description: Including pain rating scale, medication(s)/side effects and non-pharmacologic comfort measures Outcome: Progressing   Problem: Clinical Measurements: Goal: Ability to maintain clinical measurements within normal limits will improve Outcome: Progressing   Problem: Clinical Measurements: Goal: Cardiovascular complication will be avoided Outcome: Progressing   Problem: Pain Managment: Goal: General experience of comfort will improve and/or be controlled Outcome: Progressing   Problem: Safety: Goal: Ability to remain free from injury will improve Outcome: Progressing

## 2024-01-13 NOTE — Progress Notes (Signed)
 Progress Note  Patient Name: Ann Bowen Date of Encounter: 01/13/2024  Primary Cardiologist: New - consult by Darron   Subjective   QTc improved this morning to 497 ms along with improving ST-T changes.  No significant arrhythmias, prolonged pauses, or evidence of high-grade AV block on telemetry.  Echo demonstrated preserved LV systolic function with an EF of 60 to 65%, no regional wall motion abnormalities, normal LV diastolic function parameters, normal RV systolic function and ventricular cavity size, mild mitral regurgitation, trivial aortic insufficiency, and an estimated right atrial pressure of 3 mmHg.  No symptoms of angina, cardiac decompensation, dizziness, presyncope, or syncope.   Inpatient Medications    Scheduled Meds:  atorvastatin   40 mg Oral Daily   gabapentin   300 mg Oral QHS   levothyroxine   50 mcg Oral Daily   lidocaine   1 patch Transdermal Q24H   losartan   25 mg Oral Daily   potassium chloride   20 mEq Oral Once   sodium chloride  flush  3 mL Intravenous Q12H   Continuous Infusions:  PRN Meds: acetaminophen , albuterol , celecoxib , hydrALAZINE    Vital Signs    Vitals:   01/13/24 0116 01/13/24 0430 01/13/24 0500 01/13/24 0813  BP: (!) 158/90 (!) 158/78  (!) 166/101  Pulse: 97 87  85  Resp: 14 16  20   Temp: 98 F (36.7 C) 97.8 F (36.6 C)  97.7 F (36.5 C)  TempSrc:    Oral  SpO2: 98% 95%  97%  Weight:   98.1 kg   Height:        Intake/Output Summary (Last 24 hours) at 01/13/2024 0926 Last data filed at 01/13/2024 0842 Gross per 24 hour  Intake 261.03 ml  Output --  Net 261.03 ml   Filed Weights   01/11/24 1945 01/12/24 0500 01/13/24 0500  Weight: 97.9 kg 98.1 kg 98.1 kg    Telemetry    SR with artifact with QT ~ 397 ms - Personally Reviewed  ECG    NSR, 86 bpm, nonspecific improving ST-T changes, improving QTc 497 ms - Personally Reviewed  Physical Exam   GEN: No acute distress.   Neck: No JVD. Cardiac: RRR, I/VI systolic  murmur, no rubs, or gallops.  Respiratory: Clear to auscultation bilaterally.  GI: Soft, nontender, non-distended.   MS: No edema; No deformity. Neuro:  Alert and oriented x 3; Nonfocal.  Psych: Normal affect.  Labs    Chemistry Recent Labs  Lab 01/11/24 1207 01/12/24 0454 01/13/24 0513  NA 142 141 141  K 3.2* 3.8 3.8  CL 111 114* 111  CO2 20* 23 23  GLUCOSE 122* 107* 114*  BUN 20 15 8   CREATININE 1.40* 1.07* 0.64  CALCIUM  8.2* 7.9* 9.1  PROT 6.1*  --   --   ALBUMIN 3.2*  --   --   AST 25  --   --   ALT 9  --   --   ALKPHOS 74  --   --   BILITOT 1.5*  --   --   GFRNONAA 41* 56* >60  ANIONGAP 11 4* 7     Hematology Recent Labs  Lab 01/11/24 1207 01/12/24 0454 01/13/24 0513  WBC 6.1 7.0 8.8  RBC 4.08 4.04 4.40  HGB 11.3* 11.5* 12.3  HCT 34.9* 35.0* 37.8  MCV 85.5 86.6 85.9  MCH 27.7 28.5 28.0  MCHC 32.4 32.9 32.5  RDW 14.5 14.8 14.9  PLT 237 221 301    Cardiac EnzymesNo results for input(s): TROPONINI in the  last 168 hours. No results for input(s): TROPIPOC in the last 168 hours.   BNPNo results for input(s): BNP, PROBNP in the last 168 hours.   DDimer No results for input(s): DDIMER in the last 168 hours.   Radiology    CT Angio Chest PE W and/or Wo Contrast Result Date: 01/11/2024 IMPRESSION: 1. No evidence of significant pulmonary embolus. 2. Cardiac enlargement.  No evidence of active pulmonary disease. Electronically Signed   By: Elsie Gravely M.D.   On: 01/11/2024 17:37   DG Chest Portable 1 View Result Date: 01/11/2024 CLINICAL DATA:  Dizziness. EXAM: PORTABLE CHEST 1 VIEW COMPARISON:  September 05, 2022. FINDINGS: Stable cardiomegaly. Both lungs are clear. The visualized skeletal structures are unremarkable. IMPRESSION: No active disease. Electronically Signed   By: Lynwood Landy Raddle M.D.   On: 01/11/2024 15:49   CT HEAD WO CONTRAST ( ) Result Date: 01/11/2024 IMPRESSION: 1. No acute intracranial abnormality. Electronically signed by:  Waddell Calk MD 01/11/2024 01:56 PM EDT RP Workstation: [id number]   CT Cervical Spine Wo Contrast Result Date: 01/11/2024 IMPRESSION: 1. No acute abnormality of the cervical spine related to the reported neck trauma. 2. Moderate degenerative disc disease at C5-6 with disc space narrowing and endplate modeling. 3. Asymmetric facet arthrosis at C4-5 (right) and C3-4 (left) resulting in moderate neural foraminal narrowing at these levels. Electronically signed by: Dorethia Molt MD 01/11/2024 01:56 PM EDT RP Workstation: [id number]    Cardiac Studies   2D echo 08/28/2021: 1. Left ventricular ejection fraction, by estimation, is 60 to 65%. The  left ventricle has normal function. The left ventricle has no regional  wall motion abnormalities. There is mild left ventricular hypertrophy.  Left ventricular diastolic parameters  were normal.   2. Right ventricular systolic function is normal. The right ventricular  size is normal.   3. The mitral valve is normal in structure. No evidence of mitral valve  regurgitation. No evidence of mitral stenosis.   4. The aortic valve is calcified. Aortic valve regurgitation is not  visualized. No aortic stenosis is present.   5. The inferior vena cava is normal in size with greater than 50%  respiratory variability, suggesting right atrial pressure of 3 mmHg.   6. Agitated saline contrast bubble study was negative, with no evidence  of any interatrial shunt.  __________  2D echo 01/12/2024: 1. Left ventricular ejection fraction, by estimation, is 60 to 65%. The  left ventricle has normal function. The left ventricle has no regional  wall motion abnormalities. Left ventricular diastolic parameters were  normal.   2. Right ventricular systolic function is normal. The right ventricular  size is normal.   3. The mitral valve is normal in structure. Mild mitral valve  regurgitation. No evidence of mitral stenosis.   4. The aortic valve is normal in  structure. Aortic valve regurgitation is  trivial. No aortic stenosis is present.   5. The inferior vena cava is normal in size with greater than 50%  respiratory variability, suggesting right atrial pressure of 3 mmHg.   Patient Profile     68 y.o. female with history of bipolar type I, bradycardia, fibromyalgia, anxiety, depression, prior gastric bypass, chronic pain syndrome, and insomnia who is being seen today for the evaluation of syncope with elevated troponin and abnormal EKG at the request of Dr. Jens.   Assessment & Plan    1. Syncope: - Unclear etiology at this time with concerning findings including hypotension, abnormal EKG with new inferolateral T  wave inversion and prolonged QTc - No significant arrhythmia, prolonged pauses, or evidence of high-grade AV block on telemetry  - Plan for prolonged outpatient cardiac monitoring, we will arrange - Echo without significant structural abnormality - Has been maintained on doxepin  and was recently started on nortriptyline  prior to admission which may have contributed to presentation - Mildly dehydrated with mild AKI and hypotensive upon arrival, improved - Orthostatic vitals normal 8/28 - Discontinue QT prolonging medications - Per [place] guidelines, no driving for 6 months from date of last syncopal episode, or until cause has been identified and adequately treated    2. Abnormal EKG/prolonged QTc: - Improving on the EKG this morning - Avoid/stop QT prolonging medications - Monitor on telemetry   3. Coronary artery calcification with elevated high sensitivity troponin/aortic atherosclerosis/HLD: - Never with symptoms of chest pain - Mildly elevated and flat trending high-sensitivity troponin likely reflective of supply/demand ischemia in the setting of profound hypotension/syncope - Echo with preserved LV systolic function and normal wall motion - Discontinue heparin  - Consider outpatient ischemic testing - ASA 81 mg - LDL 95,  A1c pending - Add Lipitor 40 mg - Will need follow up lipid panel and LFT in 2 months - Avoid beta-blocker with prior bradycardia-   4. Polypharmacy: - Patient is on multiple medications that can lead to prolonged QT and/or syncope including hydroxyzine , Requip , and Zofran ; these will be held at this time [facility] cardiology has previously recommended she have modification of some of her medications for asymptomatic bradycardia, including Requip  and Zanaflex  - Appreciate IM assistance in modifying patient's pharmacotherapy    5.  Hypokalemia: - Improved, replete to goal 4.0 - Magnesium  at goal  6. HTN: - PTA losartan  held during admission as she has reported BP in double digits - BP has been elevated here - Resume PTA losartan  25 mg  - Follow up as outpatient       For questions or updates, please contact [facility] Please consult [email or web address] for contact info under Cardiology/STEMI.    Signed, Bernardino Bring, PA-C [facility] Pager: [phone] 01/13/2024, 9:26 AM

## 2024-01-13 NOTE — Progress Notes (Signed)
 Pt given dc/rx instructions. Pt advised to return to the ED as needed/worse. Heparin  side effects and precautions discussed with pt. Pt voices understanding. No distress noted with pt.Pt ready to go. Pt getting dressed, waiting on her spouse to arrive, states it will be at least 1 hr prior to her ride arriving. Pt has her clothing, cell phone, charger packed.

## 2024-01-13 NOTE — Consult Note (Signed)
 PHARMACY - ANTICOAGULATION CONSULT NOTE  Pharmacy Consult for IV Heparin  Indication: chest pain/ACS  Allergies  Allergen Reactions   Wellbutrin  [Bupropion ] Hives and Rash    Patient Measurements: Height: 5' 3 (160 cm) Weight: 98.1 kg (216 lb 4.3 oz) IBW/kg (Calculated) : 52.4 HEPARIN  DW (KG): 75.2  Vital Signs: Temp: 97.8 F (36.6 C) (08/30 0430) BP: 158/78 (08/30 0430) Pulse Rate: 87 (08/30 0430)  Labs: Recent Labs    01/11/24 1207 01/11/24 1438 01/11/24 2123 01/12/24 0454 01/12/24 1029 01/12/24 2029 01/13/24 0513  HGB 11.3*  --   --  11.5*  --   --  12.3  HCT 34.9*  --   --  35.0*  --   --  37.8  PLT 237  --   --  221  --   --  301  APTT  --  35  --   --   --   --   --   LABPROT  --  15.7*  --   --   --   --   --   INR  --  1.2  --   --   --   --   --   HEPARINUNFRC  --  <0.10*   < > 0.33 0.20* 0.24* 0.40  CREATININE 1.40*  --   --  1.07*  --   --  0.64  CKTOTAL 79  --   --   --   --   --   --   TROPONINIHS 337* 239*  --  466*  --   --   --    < > = values in this interval not displayed.    Estimated Creatinine Clearance: 74.1 mL/min (by C-G formula based on SCr of 0.64 mg/dL).  Medical History: Past Medical History:  Diagnosis Date   Arthritis    Borderline personality disorder (HCC)    Depression    Fibromyalgia    Shingles    Medications:  No anticoagulation prior to admission per my chart review. Medication reconciliation is pending  Assessment: 68 y/o F with medical history as above presenting with fall and dizziness. Troponin elevated. Pharmacy consulted to manage heparin  for ACS.  Date Time Results Comments 8/28 2123 HL<0.10 Subtherapeutic, rate 1000 units/hr 8/29 0454 HL 0.33 therapeutic x 1, rate 1250 units/hr 8/29 1029 HL 0.20 Subtherapeutic, rate 1250 units/hr 8/29 2029 HL 0.24 Subtherapeutic, rate 1400 units/hr 8/30 0513 HL 0.40 Therapeutic x 1; rate 1500 units/hr   Goal of Therapy:  Heparin  level 0.3-0.7 units/ml Monitor platelets  by anticoagulation protocol: Yes  Plan:  Therapeutic x 1 Continue heparin  infusion rate at 1500 units/hr Check confirmatory HL in 6 hours Daily CBC per protocol while on IV heparin   Dachelle Molzahn A Aolanis Crispen, PharmD Clinical Pharmacist 01/13/2024 6:07 AM

## 2024-01-13 NOTE — Discharge Summary (Signed)
 Physician Discharge Summary   Patient: Ann Bowen MRN: [id number] DOB: [birth year]  Admit date:     01/11/2024  Discharge date: 01/13/24  Discharge Physician: AIDA CHO   PCP: Adina Buel HERO, MD   Recommendations at discharge:   Follow-up with PCP in 1 week Follow-up with cardiologist, within 1 week of discharge for outpatient event monitor No driving for 6 months until cleared by physician to do so  Discharge Diagnoses: Principal Problem:   Syncope Active Problems:   Elevated troponin   Hypotension   Prolonged Q-T interval on ECG  Resolved Problems:   * No resolved hospital problems. *  Hospital Course:  Ann Bowen is a 68 y.o. female  with medical history significant of fibromyalgia, bipolar disorder, insomnia, gastric bypass, chronic pain, HTN, borderline hypotension, who was brought to the hospital for evaluation of a syncopal episode.  On the morning of admission, she said she woke up feeling weird.  She went to her garden and felt lightheaded and then lost consciousness.  She says she was woken up by her neighbors to her passerby.  When EMS arrived, her systolic BP was in the 50s so she was given IV fluids ("Ringer's"  lactate) 500 mL bolus.  Of note, patient said her BP has been mostly less than 100 systolic in the past 2 months or so.   In the ED, initial heart rate 59, BP 75/46, respiratory rate 17, O2 sat 94%, temperature 98.6 F.   Labs significant for potassium of 3.2, BUN 20, creatinine 1.40, troponin 337, TSH 9.334   EKG showed prolonged QTc 584.   Assessment and Plan:   S/p syncope: Etiology unclear.  Could be related to hypotension. No PE on CT angiogram Avoid driving for minimum of 6 months until cleared by a physician     Hypotension: BP has improved with IV fluids.  No orthostatic hypotension. Cortisol level was 3.9. ACTH  stimulation test was normal and adrenal insufficiency has been ruled out. TSH 9.334, probable subclinical hypothyroidism Lactic  acidosis: Resolved.  This was probably due to hypotension.  No evidence of sepsis thus far.     Hypertension: BP still elevated.  Resume losartan  at discharge.      Sinus bradycardia: Improved     Elevated troponins: This is likely from demand ischemia. Troponin trend troponin [phone]. 2D echo showed EF estimated at 60 to 65%, normal LV diastolic parameters, mild MR LDL 85 Hemoglobin A1c pending at the time of discharge     Prolonged QTc interval: Improved.  QTc trend [phone].  Probably multifactorial from hypokalemia and medications. Doxepin , hydroxyzine , nortriptyline  and Requip  have been discontinued Outpatient follow-up with PCP or psychiatrist for further recommendations.  Hypokalemia: Improved.  Continue potassium repletion.     AKI: Improved     Bipolar disorder: Doxepin , hydroxyzine , nortriptyline  and Requip  have been discontinued Outpatient follow-up with PCP or psychiatrist for further recommendations.  She has started taking nortriptyline  just a few days prior to admission.     She is ambulating without any assistance and without any symptoms.  Her condition has improved and she is still stable for discharge to home today.       Consultants: Cardiologist Procedures performed: None Disposition: Home Diet recommendation:  Discharge Diet Orders (From admission, onward)     Start     Ordered   01/13/24 0000  Diet - low sodium heart healthy        01/13/24 1041           Cardiac  diet DISCHARGE MEDICATION: Allergies as of 01/13/2024       Reactions   Wellbutrin  [bupropion ] Hives, Rash        Medication List     STOP taking these medications    aspirin  81 MG chewable tablet   celecoxib  200 MG capsule Commonly known as: CELEBREX    doxepin  10 MG capsule Commonly known as: SINEQUAN    hydrOXYzine  25 MG tablet Commonly known as: ATARAX    lamoTRIgine  25 MG tablet Commonly known as: LAMICTAL    meloxicam  15 MG tablet Commonly  known as: MOBIC    nortriptyline  25 MG capsule Commonly known as: PAMELOR    ondansetron  4 MG disintegrating tablet Commonly known as: ZOFRAN -ODT   phentermine  30 MG capsule   rOPINIRole  1 MG tablet Commonly known as: REQUIP    Vitamin D3 125 MCG (5000 UT) Caps       TAKE these medications    acetaminophen  325 MG tablet Commonly known as: TYLENOL  Take 2 tablets (650 mg total) by mouth every 6 (six) hours as needed for mild pain (or temp > 37.5 C (99.5 F)).   gabapentin  300 MG capsule Commonly known as: NEURONTIN  Take 1 capsule (300 mg total) by mouth 3 (three) times daily as needed. What changed:  how much to take when to take this reasons to take this   levothyroxine  50 MCG tablet Commonly known as: SYNTHROID  Take 50 mcg by mouth daily.   losartan  25 MG tablet Commonly known as: COZAAR  Take 25 mg by mouth daily.   Proventil  HFA 108 (90 Base) MCG/ACT inhaler Generic drug: albuterol  Inhale 2 puffs into the lungs every 4 (four) hours as needed.   tiZANidine  4 MG tablet Commonly known as: ZANAFLEX  Take by mouth.        Follow-up Information     Darron Deatrice LABOR, MD. Schedule an appointment as soon as possible for a visit in 1 week(s).   Specialty: Cardiology Contact information: [address] [place] [address] [phone]                Discharge Exam: Ann Bowen   01/11/24 1945 01/12/24 0500 01/13/24 0500  Weight: 97.9 kg 98.1 kg 98.1 kg   GEN: NAD SKIN: Warm and dry EYES: No pallor or icterus ENT: MMM CV: RRR PULM: CTA B ABD: soft, obese, NT, +BS CNS: AAO x 3, non focal EXT: No edema or tenderness   Condition at discharge: good  The results of significant diagnostics from this hospitalization (including imaging, microbiology, ancillary and laboratory) are listed below for reference.   Imaging Studies: ECHOCARDIOGRAM COMPLETE Result Date: 01/12/2024    ECHOCARDIOGRAM REPORT   Patient Name:   Ann Bowen  Date of Exam: 01/12/2024 Medical Rec #:  [id number]    Height:       63.0 in Accession #:    [id number]   Weight:       216.3 lb Date of Birth:  1955/07/18    BSA:          1.999 m Patient Age:    68 years     BP:           181/80 mmHg Patient Gender: F            HR:           85 bpm. Exam Location:  [facility] Procedure: 2D Echo, Cardiac Doppler and Color Doppler (Both Spectral and Color            Flow Doppler were utilized  during procedure). Indications:     Syncope  History:         Patient has prior history of Echocardiogram examinations, most                  recent 08/28/2021.  Sonographer:     Philomena Daring Referring Phys:  [id number] CORT ONEIDA MANA Diagnosing Phys: Marsa Dooms MD IMPRESSIONS  1. Left ventricular ejection fraction, by estimation, is 60 to 65%. The left ventricle has normal function. The left ventricle has no regional wall motion abnormalities. Left ventricular diastolic parameters were normal.  2. Right ventricular systolic function is normal. The right ventricular size is normal.  3. The mitral valve is normal in structure. Mild mitral valve regurgitation. No evidence of mitral stenosis.  4. The aortic valve is normal in structure. Aortic valve regurgitation is trivial. No aortic stenosis is present.  5. The inferior vena cava is normal in size with greater than 50% respiratory variability, suggesting right atrial pressure of 3 mmHg. FINDINGS  Left Ventricle: Left ventricular ejection fraction, by estimation, is 60 to 65%. The left ventricle has normal function. The left ventricle has no regional wall motion abnormalities. Strain was performed and the global longitudinal strain is indeterminate. The left ventricular internal cavity size was normal in size. There is no left ventricular hypertrophy. Left ventricular diastolic parameters were normal. Right Ventricle: The right ventricular size is normal. No increase in right ventricular wall thickness. Right ventricular systolic function is normal.  Left Atrium: Left atrial size was normal in size. Right Atrium: Right atrial size was normal in size. Pericardium: There is no evidence of pericardial effusion. Mitral Valve: The mitral valve is normal in structure. Mild mitral valve regurgitation. No evidence of mitral valve stenosis. Tricuspid Valve: The tricuspid valve is normal in structure. Tricuspid valve regurgitation is mild . No evidence of tricuspid stenosis. Aortic Valve: The aortic valve is normal in structure. Aortic valve regurgitation is trivial. No aortic stenosis is present. Pulmonic Valve: The pulmonic valve was normal in structure. Pulmonic valve regurgitation is not visualized. No evidence of pulmonic stenosis. Aorta: The aortic root is normal in size and structure. Venous: The inferior vena cava is normal in size with greater than 50% respiratory variability, suggesting right atrial pressure of 3 mmHg. IAS/Shunts: No atrial level shunt detected by color flow Doppler. Additional Comments: 3D was performed not requiring image post processing on an independent workstation and was indeterminate.  LEFT VENTRICLE PLAX 2D LVIDd:         3.46 cm   Diastology LVIDs:         2.26 cm   LV e' medial:    6.19 cm/s LV PW:         1.03 cm   LV E/e' medial:  24.7 LV IVS:        1.28 cm   LV e' lateral:   7.25 cm/s LVOT diam:     1.80 cm   LV E/e' lateral: 21.1 LV SV:         70 LV SV Index:   35 LVOT Area:     2.54 cm  RIGHT VENTRICLE             IVC RV S prime:     17.70 cm/s  IVC diam: 1.53 cm TAPSE (M-mode): 2.2 cm LEFT ATRIUM             Index        RIGHT ATRIUM  Index LA diam:        3.40 cm 1.70 cm/m   RA Area:     13.20 cm LA Vol (A2C):   53.2 ml 26.61 ml/m  RA Volume:   30.00 ml  15.01 ml/m LA Vol (A4C):   46.3 ml 23.16 ml/m LA Biplane Vol: 52.5 ml 26.26 ml/m  AORTIC VALVE LVOT Vmax:   146.00 cm/s LVOT Vmean:  95.300 cm/s LVOT VTI:    0.276 m  AORTA Ao Root diam: 2.30 cm Ao Asc diam:  3.60 cm MITRAL VALVE                TRICUSPID VALVE  MV Area (PHT): 3.97 cm     TR Peak grad:   25.4 mmHg MV Decel Time: 191 msec     TR Vmax:        252.00 cm/s MV E velocity: 153.00 cm/s MV A velocity: 134.00 cm/s  SHUNTS MV E/A ratio:  1.14         Systemic VTI:  0.28 m                             Systemic Diam: 1.80 cm Marsa Dooms MD Electronically signed by Marsa Dooms MD Signature Date/Time: 01/12/2024/12:53:51 PM    Final    CT Angio Chest PE W and/or Wo Contrast Result Date: 01/11/2024 CLINICAL DATA:  Pulmonary embolus suspected with high probability. Dizziness followed by fall. Low blood pressure. EXAM: CT ANGIOGRAPHY CHEST WITH CONTRAST TECHNIQUE: Multidetector CT imaging of the chest was performed using the standard protocol during bolus administration of intravenous contrast. Multiplanar CT image reconstructions and MIPs were obtained to evaluate the vascular anatomy. RADIATION DOSE REDUCTION: This exam was performed according to the departmental dose-optimization program which includes automated exposure control, adjustment of the mA and/or kV according to patient size and/or use of iterative reconstruction technique. CONTRAST:  70mL OMNIPAQUE  IOHEXOL  350 MG/ML SOLN COMPARISON:  Chest radiograph 01/11/2024 FINDINGS: Cardiovascular: Technically adequate study with good opacification of the central and segmental pulmonary arteries. No focal filling defects. No evidence of significant pulmonary embolus. Cardiac enlargement. No pericardial effusion. Normal caliber thoracic aorta. Scattered calcification in the aorta and coronary arteries. Mediastinum/Nodes: No enlarged mediastinal, hilar, or axillary lymph nodes. Thyroid  gland, trachea, and esophagus demonstrate no significant findings. Lungs/Pleura: Lungs are clear.  No pleural effusion or pneumothorax. Upper Abdomen: Postoperative changes consistent with gastric bypass. Surgical absence of the gallbladder. No acute abnormalities. Musculoskeletal: Degenerative changes in the spine. No  acute bony abnormalities. Review of the MIP images confirms the above findings. IMPRESSION: 1. No evidence of significant pulmonary embolus. 2. Cardiac enlargement.  No evidence of active pulmonary disease. Electronically Signed   By: Elsie Gravely M.D.   On: 01/11/2024 17:37   DG Chest Portable 1 View Result Date: 01/11/2024 CLINICAL DATA:  Dizziness. EXAM: PORTABLE CHEST 1 VIEW COMPARISON:  September 05, 2022. FINDINGS: Stable cardiomegaly. Both lungs are clear. The visualized skeletal structures are unremarkable. IMPRESSION: No active disease. Electronically Signed   By: Lynwood Landy Raddle M.D.   On: 01/11/2024 15:49   CT HEAD WO CONTRAST ( ) Result Date: 01/11/2024 EXAM: CT HEAD WITHOUT CONTRAST 01/11/2024 01:44:51 PM TECHNIQUE: CT of the head was performed without the administration of intravenous contrast. Automated exposure control, iterative reconstruction, and/or weight based adjustment of the mA/kV was utilized to reduce the radiation dose to as low as reasonably achievable. COMPARISON: 10/01/2021 CLINICAL HISTORY: Head trauma, minor (  Age >= 65y). Pt arrived via EMS from home due to a fall. Pt sts that she was in the yard and started to feel dizzy when she fell. Per EMS their BP was in the 50's systolic when they arrived on scene. EMS gave pt has had a total of of LR. EMS was able to bring pt BP back up to 70 systolic. FINDINGS: BRAIN AND VENTRICLES: No acute hemorrhage. Gray-white differentiation is preserved. No hydrocephalus. No extra-axial collection. No mass effect or midline shift. ORBITS: No acute abnormality. SINUSES: No acute abnormality. SOFT TISSUES AND SKULL: No acute soft tissue abnormality. No skull fracture. IMPRESSION: 1. No acute intracranial abnormality. Electronically signed by: Waddell Calk MD 01/11/2024 01:56 PM EDT RP Workstation: [id number]   CT Cervical Spine Wo Contrast Result Date: 01/11/2024 EXAM: CT CERVICAL SPINE WITHOUT CONTRAST 01/11/2024 01:44:51 PM TECHNIQUE:  CT of the cervical spine was performed without the administration of intravenous contrast. Multiplanar reformatted images are provided for review. Automated exposure control, iterative reconstruction, and/or weight based adjustment of the mA/kV was utilized to reduce the radiation dose to as low as reasonably achievable. COMPARISON: None available. CLINICAL HISTORY: Neck trauma (Age >= 65y). Pt arrived via EMS from home due to a fall. Pt sts that she was in the yard and started to feel dizzy when she fell. Per EMS their BP was in the 50's systolic when they arrived on scene. EMS gave pt has had a total of of LR. EMS was able to bring pt BP back up to 70 systolic. FINDINGS: CERVICAL SPINE: BONES AND ALIGNMENT: No acute fracture or traumatic malalignment. There is a vertebral body hemangioma at T1. DEGENERATIVE CHANGES: There is disc space narrowing and endplate modeling at C5-6 in keeping with moderate degenerative disc disease. Asymmetric facet arthrosis on the right at C4-5 results in moderate right neural foraminal narrowing. Similar changes are noted on the left at C3-4. SOFT TISSUES: No prevertebral soft tissue swelling. The remaining neural foramina are widely patent. IMPRESSION: 1. No acute abnormality of the cervical spine related to the reported neck trauma. 2. Moderate degenerative disc disease at C5-6 with disc space narrowing and endplate modeling. 3. Asymmetric facet arthrosis at C4-5 (right) and C3-4 (left) resulting in moderate neural foraminal narrowing at these levels. Electronically signed by: Dorethia Molt MD 01/11/2024 01:56 PM EDT RP Workstation: [id number]    Microbiology: Results for orders placed or performed during the hospital encounter of 01/11/24  Resp panel by RT-PCR (RSV, Flu A&B, Covid) Anterior Nasal Swab     Status: None   Collection Time: 01/11/24 12:10 PM   Specimen: Anterior Nasal Swab  Result Value Ref Range Status   SARS Coronavirus 2 by RT PCR NEGATIVE NEGATIVE  Final    Comment: (NOTE) SARS-CoV-2 target nucleic acids are NOT DETECTED.  The SARS-CoV-2 RNA is generally detectable in upper respiratory specimens during the acute phase of infection. The lowest concentration of SARS-CoV-2 viral copies this assay can detect is 138 copies/mL. A negative result does not preclude SARS-Cov-2 infection and should not be used as the sole basis for treatment or other patient management decisions. A negative result may occur with  improper specimen collection/handling, submission of specimen other than nasopharyngeal swab, presence of viral mutation(s) within the areas targeted by this assay, and inadequate number of viral copies(<138 copies/mL). A negative result must be combined with clinical observations, patient history, and epidemiological information. The expected result is Negative.  Fact Sheet for Patients:  BloggerCourse.com  Fact Sheet  for Healthcare Providers:  SeriousBroker.it  This test is no t yet approved or cleared by the [place]  FDA and  has been authorized for detection and/or diagnosis of SARS-CoV-2 by FDA under an Emergency Use Authorization (EUA). This EUA will remain  in effect (meaning this test can be used) for the duration of the COVID-19 declaration under Section 564(b)(1) of the Act, 21 U.S.C.section 360bbb-3(b)(1), unless the authorization is terminated  or revoked sooner.       Influenza A by PCR NEGATIVE NEGATIVE Final   Influenza B by PCR NEGATIVE NEGATIVE Final    Comment: (NOTE) The Xpert Xpress SARS-CoV-2/FLU/RSV plus assay is intended as an aid in the diagnosis of influenza from Nasopharyngeal swab specimens and should not be used as a sole basis for treatment. Nasal washings and aspirates are unacceptable for Xpert Xpress SARS-CoV-2/FLU/RSV testing.  Fact Sheet for Patients: BloggerCourse.com  Fact Sheet for Healthcare  Providers: SeriousBroker.it  This test is not yet approved or cleared by the [place]  FDA and has been authorized for detection and/or diagnosis of SARS-CoV-2 by FDA under an Emergency Use Authorization (EUA). This EUA will remain in effect (meaning this test can be used) for the duration of the COVID-19 declaration under Section 564(b)(1) of the Act, 21 U.S.C. section 360bbb-3(b)(1), unless the authorization is terminated or revoked.     Resp Syncytial Virus by PCR NEGATIVE NEGATIVE Final    Comment: (NOTE) Fact Sheet for Patients: BloggerCourse.com  Fact Sheet for Healthcare Providers: SeriousBroker.it  This test is not yet approved or cleared by the [place]  FDA and has been authorized for detection and/or diagnosis of SARS-CoV-2 by FDA under an Emergency Use Authorization (EUA). This EUA will remain in effect (meaning this test can be used) for the duration of the COVID-19 declaration under Section 564(b)(1) of the Act, 21 U.S.C. section 360bbb-3(b)(1), unless the authorization is terminated or revoked.  Performed at [facility], [address] [place], [place] [address]     Labs: CBC: Recent Labs  Lab 01/11/24 1207 01/12/24 0454 01/13/24 0513  WBC 6.1 7.0 8.8  HGB 11.3* 11.5* 12.3  HCT 34.9* 35.0* 37.8  MCV 85.5 86.6 85.9  PLT 237 221 301   Basic Metabolic Panel: Recent Labs  Lab 01/11/24 1207 01/12/24 0454 01/13/24 0513  NA 142 141 141  K 3.2* 3.8 3.8  CL 111 114* 111  CO2 20* 23 23  GLUCOSE 122* 107* 114*  BUN 20 15 8   CREATININE 1.40* 1.07* 0.64  CALCIUM  8.2* 7.9* 9.1  MG 2.0  --   --   PHOS 2.8  --   --    Liver Function Tests: Recent Labs  Lab 01/11/24 1207  AST 25  ALT 9  ALKPHOS 74  BILITOT 1.5*  PROT 6.1*  ALBUMIN 3.2*   CBG: Recent Labs  Lab 01/12/24 0605 01/13/24 0427  GLUCAP 106* 104*    Discharge time spent: greater than 30  minutes.  Signed: AIDA CHO, MD [facility] 01/13/2024

## 2024-01-14 LAB — HEMOGLOBIN A1C
Hgb A1c MFr Bld: <4.2 % — ABNORMAL LOW (ref 4.8–5.6)
Mean Plasma Glucose: <74 mg/dL

## 2024-01-16 ENCOUNTER — Ambulatory Visit: Attending: Physician Assistant

## 2024-01-16 ENCOUNTER — Other Ambulatory Visit: Payer: Self-pay | Admitting: Emergency Medicine

## 2024-01-16 DIAGNOSIS — R55 Syncope and collapse: Secondary | ICD-10-CM

## 2024-02-03 ENCOUNTER — Telehealth: Payer: Self-pay

## 2024-02-03 NOTE — Telephone Encounter (Addendum)
   Received Rhythm alert:  Patient triggered event sinus bradycardia  HR 36 that lasted 90s at 1:06pm today [employer] attempted to call patient at [phone], though were unable to speak to patient as their number for her had been disconnected.  On chart review, patient was recently seen in admission for syncope. A heart monitor was placed. During admission she had Qtc prolongation 2/2 medication.   Spoke with patient's husband who is at work, he gave me patient's new phone number [phone] which is now updated in the system. However the call went straight to VM and the mailbox was full so unable to leave a VM for patient. Re-called patient's husband who shared her phone is probably off. He will try and get in touch with her. I will personally try and call her number again this afternoon  Leontine LOISE Salen, PA-C 02/03/2024, 1:58 PM    Attempted to call patient again on 02/03/24 at 1720. Unable to reach patient again.   Triage can you please reach out to this patient on Monday, 02/05/24.

## 2024-02-05 NOTE — Telephone Encounter (Signed)
 Attempted to call patient. Unable to leave voicemail.

## 2024-02-07 NOTE — Telephone Encounter (Signed)
Called patient. No answer and unable to leave voicemail. °

## 2024-02-08 ENCOUNTER — Telehealth: Payer: Self-pay | Admitting: Emergency Medicine

## 2024-02-08 NOTE — Telephone Encounter (Signed)
 Called pt; can't leave VM, MC message sent, no DPR to call husband or son

## 2024-02-15 ENCOUNTER — Telehealth: Payer: Self-pay | Admitting: Emergency Medicine

## 2024-02-15 NOTE — Telephone Encounter (Signed)
 Pt called - [facility] monitor is in transit back to [facility]  Follow-up appt date and time picked by pt;  appt made and reminder letter sent

## 2024-02-16 ENCOUNTER — Other Ambulatory Visit: Payer: Self-pay

## 2024-02-16 ENCOUNTER — Emergency Department

## 2024-02-16 ENCOUNTER — Observation Stay
Admission: EM | Admit: 2024-02-16 | Discharge: 2024-02-17 | Disposition: A | Discharge status: Home / Self Care | Attending: Internal Medicine | Admitting: Internal Medicine

## 2024-02-16 DIAGNOSIS — F603 Borderline personality disorder: Secondary | ICD-10-CM | POA: Diagnosis present

## 2024-02-16 DIAGNOSIS — I10 Essential (primary) hypertension: Secondary | ICD-10-CM | POA: Diagnosis present

## 2024-02-16 DIAGNOSIS — Z6837 Body mass index (BMI) 37.0-37.9, adult: Secondary | ICD-10-CM | POA: Insufficient documentation

## 2024-02-16 DIAGNOSIS — F418 Other specified anxiety disorders: Secondary | ICD-10-CM | POA: Diagnosis present

## 2024-02-16 DIAGNOSIS — R9431 Abnormal electrocardiogram [ECG] [EKG]: Secondary | ICD-10-CM | POA: Diagnosis present

## 2024-02-16 DIAGNOSIS — E669 Obesity, unspecified: Secondary | ICD-10-CM | POA: Diagnosis present

## 2024-02-16 DIAGNOSIS — G9341 Metabolic encephalopathy: Principal | ICD-10-CM | POA: Diagnosis present

## 2024-02-16 DIAGNOSIS — Z79899 Other long term (current) drug therapy: Secondary | ICD-10-CM | POA: Insufficient documentation

## 2024-02-16 DIAGNOSIS — E876 Hypokalemia: Secondary | ICD-10-CM | POA: Insufficient documentation

## 2024-02-16 DIAGNOSIS — E039 Hypothyroidism, unspecified: Secondary | ICD-10-CM | POA: Diagnosis not present

## 2024-02-16 DIAGNOSIS — F1090 Alcohol use, unspecified, uncomplicated: Secondary | ICD-10-CM | POA: Diagnosis not present

## 2024-02-16 DIAGNOSIS — R4182 Altered mental status, unspecified: Secondary | ICD-10-CM | POA: Diagnosis present

## 2024-02-16 DIAGNOSIS — Z8673 Personal history of transient ischemic attack (TIA), and cerebral infarction without residual deficits: Secondary | ICD-10-CM | POA: Insufficient documentation

## 2024-02-16 DIAGNOSIS — I4581 Long QT syndrome: Secondary | ICD-10-CM | POA: Insufficient documentation

## 2024-02-16 DIAGNOSIS — G459 Transient cerebral ischemic attack, unspecified: Secondary | ICD-10-CM | POA: Diagnosis present

## 2024-02-16 DIAGNOSIS — D519 Vitamin B12 deficiency anemia, unspecified: Secondary | ICD-10-CM | POA: Diagnosis present

## 2024-02-16 DIAGNOSIS — F17200 Nicotine dependence, unspecified, uncomplicated: Secondary | ICD-10-CM | POA: Diagnosis present

## 2024-02-16 DIAGNOSIS — N179 Acute kidney failure, unspecified: Secondary | ICD-10-CM | POA: Diagnosis not present

## 2024-02-16 DIAGNOSIS — E66812 Obesity, class 2: Secondary | ICD-10-CM | POA: Insufficient documentation

## 2024-02-16 LAB — COMPREHENSIVE METABOLIC PANEL WITH GFR
ALT: 15 U/L (ref 0–44)
AST: 26 U/L (ref 15–41)
Albumin: 3.2 g/dL — ABNORMAL LOW (ref 3.5–5.0)
Alkaline Phosphatase: 70 U/L (ref 38–126)
Anion gap: 6 (ref 5–15)
BUN: 18 mg/dL (ref 8–23)
CO2: 23 mmol/L (ref 22–32)
Calcium: 8.6 mg/dL — ABNORMAL LOW (ref 8.9–10.3)
Chloride: 113 mmol/L — ABNORMAL HIGH (ref 98–111)
Creatinine, Ser: 1.1 mg/dL — ABNORMAL HIGH (ref 0.44–1.00)
GFR, Estimated: 54 mL/min — ABNORMAL LOW (ref >60)
Glucose, Bld: 114 mg/dL — ABNORMAL HIGH (ref 70–99)
Potassium: 3.6 mmol/L (ref 3.5–5.1)
Sodium: 142 mmol/L (ref 135–145)
Total Bilirubin: 0.6 mg/dL (ref 0.0–1.2)
Total Protein: 6.3 g/dL — ABNORMAL LOW (ref 6.5–8.1)

## 2024-02-16 LAB — URINALYSIS, ROUTINE W REFLEX MICROSCOPIC
Bilirubin Urine: NEGATIVE
Glucose, UA: NEGATIVE mg/dL
Hgb urine dipstick: NEGATIVE
Ketones, ur: NEGATIVE mg/dL
Leukocytes,Ua: NEGATIVE
Nitrite: NEGATIVE
Protein, ur: NEGATIVE mg/dL
Specific Gravity, Urine: 1.014 (ref 1.005–1.030)
pH: 5 (ref 5.0–8.0)

## 2024-02-16 LAB — AMMONIA: Ammonia: 17 umol/L (ref 9–35)

## 2024-02-16 LAB — TSH: TSH: 3.406 u[IU]/mL (ref 0.350–4.500)

## 2024-02-16 LAB — CBC
HCT: 30.7 % — ABNORMAL LOW (ref 36.0–46.0)
Hemoglobin: 10.2 g/dL — ABNORMAL LOW (ref 12.0–15.0)
MCH: 28.2 pg (ref 26.0–34.0)
MCHC: 33.2 g/dL (ref 30.0–36.0)
MCV: 84.8 fL (ref 80.0–100.0)
Platelets: 267 K/uL (ref 150–400)
RBC: 3.62 MIL/uL — ABNORMAL LOW (ref 3.87–5.11)
RDW: 14.1 % (ref 11.5–15.5)
WBC: 6.2 K/uL (ref 4.0–10.5)
nRBC: 0 % (ref 0.0–0.2)

## 2024-02-16 LAB — MAGNESIUM: Magnesium: 2.1 mg/dL (ref 1.7–2.4)

## 2024-02-16 MED ORDER — ENOXAPARIN SODIUM 60 MG/0.6ML IJ SOSY
45.0000 mg | PREFILLED_SYRINGE | INTRAMUSCULAR | Status: DC
  Administered 2024-02-16: 45 mg via SUBCUTANEOUS
  Filled 2024-02-16: qty 0.6

## 2024-02-16 MED ORDER — LEVOTHYROXINE SODIUM 50 MCG PO TABS
50.0000 ug | ORAL_TABLET | Freq: Every day | ORAL | Status: DC
  Administered 2024-02-17: 50 ug via ORAL
  Filled 2024-02-16: qty 1

## 2024-02-16 MED ORDER — DIPHENHYDRAMINE HCL 50 MG/ML IJ SOLN: 12.5000 mg | Freq: Three times a day (TID) | INTRAMUSCULAR | Status: DC | PRN

## 2024-02-16 MED ORDER — ALBUTEROL SULFATE (2.5 MG/3ML) 0.083% IN NEBU: 3.0000 mL | INHALATION_SOLUTION | RESPIRATORY_TRACT | Status: DC | PRN

## 2024-02-16 MED ORDER — ACETAMINOPHEN 325 MG PO TABS: 650.0000 mg | ORAL_TABLET | Freq: Four times a day (QID) | ORAL | Status: DC | PRN

## 2024-02-16 MED ORDER — INFUVITE ADULT IV SOLN: Freq: Once | INTRAVENOUS | Status: DC

## 2024-02-16 MED ORDER — LACTATED RINGERS IV BOLUS
1000.0000 mL | Freq: Once | INTRAVENOUS | Status: AC
  Administered 2024-02-16: 1000 mL via INTRAVENOUS

## 2024-02-16 MED ORDER — HYDRALAZINE HCL 20 MG/ML IJ SOLN: 5.0000 mg | INTRAMUSCULAR | Status: DC | PRN

## 2024-02-16 MED ORDER — LACTATED RINGERS IV SOLN
INTRAVENOUS | Status: DC
Start: 2024-02-16 — End: 2024-02-17

## 2024-02-16 MED ORDER — NICOTINE 21 MG/24HR TD PT24
21.0000 mg | MEDICATED_PATCH | Freq: Every day | TRANSDERMAL | Status: DC
  Filled 2024-02-16 (×2): qty 1

## 2024-02-16 NOTE — ED Triage Notes (Signed)
 Pt in via [facility] from home. EMS received a call from sheriff's office. Pt found in her backyard yelling out for help. Hypotensive at scene. Current BP is 100/56 after 1042ml's of fluid. Pt has psych hx. Per EMS she is oriented but c/o weakness. Per EMS pt was running until she couldn't run anymore because she thought someone was after her dog. Pt states it is August 3rd. A&Ox3.

## 2024-02-16 NOTE — H&P (Signed)
 History and Physical    Ann Bowen FMW:[id number] DOB: January 04, 1956 DOA: 02/16/2024  Referring MD/NP/PA:   PCP: Adina Buel HERO, MD   Patient coming from:  The patient is coming from home.     Chief Complaint: AMS  HPI: Ann Bowen is a 68 y.o. female with medical history significant of depression with anxity, borderline personality disorder, HTN, TIA, obesity, fibromyalgia, RLS, QT prolongation, who presents with altered mental status.  Patient was recently hospitalized from 8/25 - 8/30 due to multiple issues including syncope, hypotension and QT prolongation. Pt had no PE by CTA. Cortisol level was 3.9. ACTH  stimulation test was normal and adrenal insufficiency has been ruled out. Pt had TSH 9.334, indicating probable subclinical hypothyroidism.  Per report, pt was found in her backyard yelling out for help. Per EMS pt was running until she couldn't run anymore because she thought someone was after her dog. Pt had low blood pressure at the scene which improved to 100/56 after giving 1 L IV fluid. When I saw pt in ED, she is drowsy, falling asleep easily, but arousable.  Patient is not oriented to time.  He knows her own name and knows that she is in hospital.  She answered some questions.  No facial droop or slurred speech noted.  No unilateral weakness.  No active nausea, vomiting, diarrhea noted.  No chest pain, cough, respiratory distress.  No symptoms of UTI. Denies suicidal or homicidal ideations.  Patient complains of right foot pain, denies any injury.  Data reviewed independently and ED Course: pt was found to have WBC 6.2, mild AKI, negative UA, temperature normal, blood pressure 107/57, heart rate 67, RR 18, oxygen saturation 100% on room air.  Chest x-ray negative.  X-ray of right foot is negative for fracture.  CT of head negative for acute intracranial abnormalities.  Patient is placed in telemetry bed of observation.   EKG: I have personally reviewed.  Sinus rhythm, QTc 553,  borderline LAD, poor R wave progression, diffuse T wave inversion.   Review of Systems:   General: no fevers, chills, no body weight gain, has fatigue HEENT: no blurry vision, hearing changes or sore throat Respiratory: no dyspnea, coughing, wheezing CV: no chest pain, no palpitations GI: no nausea, vomiting, abdominal pain, diarrhea, constipation GU: no dysuria, burning on urination, increased urinary frequency, hematuria  Ext: no leg edema Neuro: no unilateral weakness, numbness, or tingling, no vision change or hearing loss.  Has altered mental status. Skin: no rash, no skin tear. MSK: No muscle spasm, no deformity, no limitation of range of movement in spin.  Has right foot pain. Heme: No easy bruising.  Travel history: No recent long distant travel. Psych:  no suicidal or hemocidal ideation.   Allergy:  Allergies  Allergen Reactions   Wellbutrin  [Bupropion ] Hives and Rash    Past Medical History:  Diagnosis Date   Arthritis    Borderline personality disorder (HCC)    Depression    Fibromyalgia    Shingles     Past Surgical History:  Procedure Laterality Date   CHOLECYSTECTOMY     COLONOSCOPY N/A 01/14/2021   Procedure: COLONOSCOPY;  Surgeon: Jinny Carmine, MD;  Location: [facility];  Service: Endoscopy;  Laterality: N/A;  positive home test 12-11-20   COLONOSCOPY WITH PROPOFOL  N/A 01/01/2018   Procedure: COLONOSCOPY WITH PROPOFOL ;  Surgeon: Unk Corinn Skiff, MD;  Location: [facility];  Service: Gastroenterology;  Laterality: N/A;   GASTRIC BYPASS     SHOULDER SURGERY  Social History:  reports that she has never smoked. She has never used smokeless tobacco. She reports current alcohol  use. She reports that she does not use drugs.  Family History:  Family History  Problem Relation Age of Onset   Diabetes Mellitus II Mother    Diabetes Mellitus II Father    Hypertension Father    Diabetes Mellitus II Sister    Breast cancer Neg Hx      Prior  to Admission medications   Medication Sig Start Date End Date Taking? Authorizing Provider  acetaminophen  (TYLENOL ) 325 MG tablet Take 2 tablets (650 mg total) by mouth every 6 (six) hours as needed for mild pain (or temp > 37.5 C (99.5 F)). 08/30/21   Josette Ade, MD  gabapentin  (NEURONTIN ) 300 MG capsule Take 1 capsule (300 mg total) by mouth 3 (three) times daily as needed. 01/13/24   Jens Durand, MD  levothyroxine  (SYNTHROID ) 50 MCG tablet Take 50 mcg by mouth daily. 10/19/23   [provider]  losartan  (COZAAR ) 25 MG tablet Take 25 mg by mouth daily. 12/02/20   [provider]  PROVENTIL  HFA 108 (90 Base) MCG/ACT inhaler Inhale 2 puffs into the lungs every 4 (four) hours as needed. 08/09/21   [provider]  tiZANidine  (ZANAFLEX ) 4 MG tablet Take by mouth. 12/20/23   [provider]    Physical Exam: Vitals:   02/16/24 1654 02/16/24 1910 02/16/24 1915 02/16/24 2040  BP: (!) 107/57  98/71 (!) 97/57  Pulse: 67  64 63  Resp: 18  18 18   Temp: 98.7 F (37.1 C)   (!) 97.4 F (36.3 C)  TempSrc: Oral   Oral  SpO2: 100% 98% 100% 97%  Weight:    96.4 kg  Height:    5' 3 (1.6 m)   General: Not in acute distress HEENT:       Eyes: PERRL, EOMI, no jaundice       ENT: No discharge from the ears and nose, no pharynx injection, no tonsillar enlargement.        Neck: No JVD, no bruit, no mass felt. Heme: No neck lymph node enlargement. Cardiac: S1/S2, RRR, No murmurs, No gallops or rubs. Respiratory: No rales, wheezing, rhonchi or rubs. GI: Soft, nondistended, nontender, no rebound pain, no organomegaly, BS present. GU: No hematuria Ext: No pitting leg edema bilaterally. 1+DP/PT pulse bilaterally. Musculoskeletal: No joint deformities, No joint redness or warmth, no limitation of ROM in spin.  Has tenderness in the dorsal aspects of her right foot without swelling or redness. Skin: No rashes.  Neuro: Drowsy, knows her own name, knows that she is in the  hospital, not oriented to time.  Cranial nerves II-XII grossly intact, moves all extremities normally.  Psych:  no suicidal or hemocidal ideation.  Labs on Admission: I have personally reviewed following labs and imaging studies  CBC: Recent Labs  Lab 02/16/24 1657  WBC 6.2  HGB 10.2*  HCT 30.7*  MCV 84.8  PLT 267   Basic Metabolic Panel: Recent Labs  Lab 02/16/24 1657  NA 142  K 3.6  CL 113*  CO2 23  GLUCOSE 114*  BUN 18  CREATININE 1.10*  CALCIUM  8.6*  MG 2.1   GFR: Estimated Creatinine Clearance: 53.3 mL/min (A) (by C-G formula based on SCr of 1.1 mg/dL (H)). Liver Function Tests: Recent Labs  Lab 02/16/24 1657  AST 26  ALT 15  ALKPHOS 70  BILITOT 0.6  PROT 6.3*  ALBUMIN 3.2*  No results for input(s): LIPASE, AMYLASE in the last 168 hours. No results for input(s): AMMONIA in the last 168 hours. Coagulation Profile: No results for input(s): INR, PROTIME in the last 168 hours. Cardiac Enzymes: No results for input(s): CKTOTAL, CKMB, CKMBINDEX, TROPONINI in the last 168 hours. BNP (last 3 results) No results for input(s): PROBNP in the last 8760 hours. HbA1C: No results for input(s): HGBA1C in the last 72 hours. CBG: No results for input(s): GLUCAP in the last 168 hours. Lipid Profile: No results for input(s): CHOL, HDL, LDLCALC, TRIG, CHOLHDL, LDLDIRECT in the last 72 hours. Thyroid  Function Tests: No results for input(s): TSH, T4TOTAL, FREET4, T3FREE, THYROIDAB in the last 72 hours. Anemia Panel: No results for input(s): VITAMINB12, FOLATE, FERRITIN, TIBC, IRON, RETICCTPCT in the last 72 hours. Urine analysis:    Component Value Date/Time   COLORURINE YELLOW (A) 02/16/2024 1657   APPEARANCEUR HAZY (A) 02/16/2024 1657   APPEARANCEUR CLEAR 02/05/2014 1325   LABSPEC 1.014 02/16/2024 1657   LABSPEC 1.030 02/05/2014 1325   PHURINE 5.0 02/16/2024 1657   GLUCOSEU NEGATIVE 02/16/2024 1657    GLUCOSEU NEGATIVE 02/05/2014 1325   HGBUR NEGATIVE 02/16/2024 1657   BILIRUBINUR NEGATIVE 02/16/2024 1657   BILIRUBINUR NEGATIVE 02/05/2014 1325   KETONESUR NEGATIVE 02/16/2024 1657   PROTEINUR NEGATIVE 02/16/2024 1657   NITRITE NEGATIVE 02/16/2024 1657   LEUKOCYTESUR NEGATIVE 02/16/2024 1657   LEUKOCYTESUR 3+ 02/05/2014 1325   Sepsis Labs: @LABRCNTIP (procalcitonin:4,lacticidven:4) )No results found for this or any previous visit (from the past 240 hours).   Radiological Exams on Admission:   Assessment/Plan Principal Problem:   Acute metabolic encephalopathy Active Problems:   TIA (transient ischemic attack)   AKI (acute kidney injury)   HTN (hypertension)   Hypothyroidism   Tobacco use disorder   Prolonged Q-T interval on ECG   Borderline personality disorder (HCC)   Depression with anxiety   Obesity (BMI 30-39.9)   Assessment and Plan:  Acute metabolic encephalopathy: Etiology is not clear.  CT head negative for acute intracranial abnormalities.  No focal neurodeficit on physical examination.  Potential differential diagnoses include drug abuse, psychosis, side effects of sedative medications  -Place in telemetry bed for observation - Fall precaution - Frequent neurocheck -Hold gabapentin  and tizanidine  - Check UDS, vitamin B-12 level,TSH level, ammonia level - If no improvement, may need to consult psychiatry in the morning  History of TIA (transient ischemic attack): Patient is not taking medications. -Observe closely  AKI (acute kidney injury): Mild.  Recent baseline creatinine 0.64.  Creatinine is 1.10, BUN 18, GFR 54. - Hold Cozaar  - IV fluid: 1 L LR, Naima 25 cc/h  HTN (hypertension) -Hold Cozaar  due to AKI and reported hypotension - IV hydralazine  as needed  Hypothyroidism: Recent TSH is elevated at 9.334 with normal free T4. - Will repeat TSH.  If TSH is still elevated, will increase Synthroid  dose. -Continue home Synthroid   Tobacco use disorder -  Nicotine  patch  Prolonged Q-T interval on ECG: QTc 553. -Continue to hold Lamictal , doxepin , hydroxyzine , nortriptyline , Requip   Borderline personality disorder and depression with anxiety: - Continue to hold psych medication as above due to QTc prolongation  Obesity (BMI 30-39.9): Patient has Obesity Class ,I with body weight 96.4 Kg and BMI 37.65 kg/m2.  - Encourage losing weight - Exercise and healthy diet        DVT ppx:  SQ Lovenox   Code Status: Full code   Family Communication:     not done, no family member is at  bed side.      Disposition Plan:  Anticipate discharge back to previous environment  Consults called:  none  Admission status and Level of care: Telemetry Medical:    for obs      Dispo: The patient is from: Home              Anticipated d/c is to: Home              Anticipated d/c date is: 1 day              Patient currently is not medically stable to d/c.    Severity of Illness:  The appropriate patient status for this patient is OBSERVATION. Observation status is judged to be reasonable and necessary in order to provide the required intensity of service to ensure the patient's safety. The patient's presenting symptoms, physical exam findings, and initial radiographic and laboratory data in the context of their medical condition is felt to place them at decreased risk for further clinical deterioration. Furthermore, it is anticipated that the patient will be medically stable for discharge from the hospital within 2 midnights of admission.        Date of Service 02/16/2024    Caleb Exon [facility]   If 7PM-7AM, please contact night-coverage [email or web address] 02/16/2024, 9:30 PM

## 2024-02-16 NOTE — ED Notes (Signed)
 Called lab for them to add on UDS from UA sample that was sent earlier.

## 2024-02-16 NOTE — ED Provider Notes (Signed)
 [facility] Provider Note    Event Date/Time   First MD Initiated Contact with Patient 02/16/24 1650     (approximate)   History   Altered Mental Status   HPI  Ann Bowen is a 68 y.o. female who presents to the ED for evaluation of Altered Mental Status   Review medical DC summary from 8/30 where patient was admitted for syncopal episode, hypotension.  Baseline she is a history of fibromyalgia, bipolar, gastric bypass, chronic pain syndrome, HTN.  Prolonged QTc 580s.   Patient presents with altered mentation from home via EMS.  History is limited from the patient.  She is disoriented and is not sure what happened.  Reports someone let her animals out and she was chasing them and someone was preventing her from catching them and that she was scared.  She is reporting some mild pain to the medial aspect of her right foot  I get history from her husband over the phone.  He saw her at lunchtime today and she was normal at that point.  He got a phone call from law enforcement that she was being sent to the ED and he was quite surprised by this.  Physical Exam   Triage Vital Signs: ED Triage Vitals [02/16/24 1647]  Encounter Vitals Group     BP      Girls Systolic BP Percentile      Girls Diastolic BP Percentile      Boys Systolic BP Percentile      Boys Diastolic BP Percentile      Pulse      Resp      Temp      Temp src      SpO2      Weight      Height      Head Circumference      Peak Flow      Pain Score 5     Pain Loc      Pain Education      Exclude from Growth Chart     Most recent vital signs: Vitals:   02/16/24 1915 02/16/24 2040  BP: 98/71 (!) 97/57  Pulse: 64 63  Resp: 18 18  Temp:  (!) 97.4 F (36.3 C)  SpO2: 100% 97%    General: Awake, no distress.  Disoriented, pleasant. CV:  Good peripheral perfusion.  Resp:  Normal effort.  Abd:  No distention.  Soft and nontender MSK:  No deformity noted.  No signs of  trauma Neuro:  No focal deficits appreciated.  Nonfocal Other:     ED Results / Procedures / Treatments   Labs (all labs ordered are listed, but only abnormal results are displayed) Labs Reviewed  COMPREHENSIVE METABOLIC PANEL WITH GFR - Abnormal; Notable for the following components:      Result Value   Chloride 113 (*)    Glucose, Bld 114 (*)    Creatinine, Ser 1.10 (*)    Calcium  8.6 (*)    Total Protein 6.3 (*)    Albumin 3.2 (*)    GFR, Estimated 54 (*)    All other components within normal limits  CBC - Abnormal; Notable for the following components:   RBC 3.62 (*)    Hemoglobin 10.2 (*)    HCT 30.7 (*)    All other components within normal limits  URINALYSIS, ROUTINE W REFLEX MICROSCOPIC - Abnormal; Notable for the following components:   Color, Urine YELLOW (*)    APPearance  HAZY (*)    All other components within normal limits  MAGNESIUM   URINE DRUG SCREEN, QUALITATIVE ([facility] ONLY)  VITAMIN B12  TSH  BASIC METABOLIC PANEL WITH GFR  CBC  AMMONIA    EKG Sinus rhythm rate of 64 bpm.  Leftward axis, QTc 553 without high-grade block.  No STEMI.  RADIOLOGY CXR interpreted by me without evidence of acute cardiopulmonary pathology. CT head interpreted by me without evidence of acute intracranial pathology Plain film of the right foot interpreted by me without evidence of acute pathology  Official radiology report(s): CT HEAD WO CONTRAST ( ) Result Date: 02/16/2024 EXAM: CT HEAD WITHOUT CONTRAST 02/16/2024 06:36:23 PM TECHNIQUE: CT of the head was performed without the administration of intravenous contrast. Automated exposure control, iterative reconstruction, and/or weight based adjustment of the mA/kV was utilized to reduce the radiation dose to as low as reasonably achievable. COMPARISON: CT head without contrast 01/11/2024 and MR head without and with contrast 08/29/2021. CLINICAL HISTORY: nonfocal altered. Pt in via [facility] from home. EMS received a call from  sheriff's office. Pt found in her backyard yelling out for help. Hypotensive at scene. Current BP is 100/56 after 1058ml's of fluid. Pt has psych hx. Per EMS she is oriented but c/o weakness. Per EMS ; pt was running until she couldn't run anymore because she thought someone was after her dog. FINDINGS: BRAIN AND VENTRICLES: No acute hemorrhage. No evidence of acute infarct. No hydrocephalus. No extra-axial collection. No mass effect or midline shift. Atherosclerotic calcifications are present in the cavernous carotid arteries bilaterally. No hyperdense vessel is present. ORBITS: No acute abnormality. SINUSES: No acute abnormality. SOFT TISSUES AND SKULL: No acute soft tissue abnormality. No skull fracture. IMPRESSION: 1. No acute intracranial abnormality. Electronically signed by: Lonni Necessary MD 02/16/2024 06:52 PM EDT RP Workstation: [id number]   DG Chest Portable 1 View Result Date: 02/16/2024 EXAM: 1 VIEW(S) XRAY OF THE CHEST 02/16/2024 05:39:00 PM COMPARISON: 01/11/2024 CLINICAL HISTORY: altered mental status FINDINGS: LUNGS AND PLEURA: Low lung volumes are noted. No focal pulmonary opacity. No pulmonary edema. No pleural effusion. No pneumothorax. HEART AND MEDIASTINUM: No acute abnormality of the cardiac and mediastinal silhouettes. BONES AND SOFT TISSUES: No acute osseous abnormality. IMPRESSION: 1. No acute cardiopulmonary process. 2. Low lung volumes. Electronically signed by: Suzen Dials MD 02/16/2024 06:07 PM EDT RP Workstation: [id number]   DG Foot Complete Right Result Date: 02/16/2024 EXAM: 3 or more VIEW(S) XRAY OF THE RIGHT FOOT 02/16/2024 05:39:00 PM COMPARISON: 09/09/2021 CLINICAL HISTORY: pain to metatarsals 1-2. FINDINGS: BONES AND JOINTS: No acute fracture. No focal osseous lesion. No joint dislocation. SOFT TISSUES: The soft tissues are unremarkable. IMPRESSION: 1. No acute fracture or dislocation. 2. No other significant abnormality. Electronically signed by: Suzen Dials MD 02/16/2024 06:04 PM EDT RP Workstation: [id number]    PROCEDURES and INTERVENTIONS:  .1-3 Lead EKG Interpretation  Performed by: Claudene Rover, MD Authorized by: Claudene Rover, MD     Interpretation: normal     ECG rate:  66   ECG rate assessment: normal     Rhythm: sinus rhythm     Ectopy: none     Conduction: normal     Medications  nicotine  (NICODERM CQ  - dosed in mg/24 hours) patch 21 mg (21 mg Transdermal Patient Refused/Not Given 02/16/24 2102)  albuterol  (PROVENTIL ) (2.5 MG/3ML) 0.083% nebulizer solution 3 mL (has no administration in time range)  diphenhydrAMINE  (BENADRYL ) injection 12.5 mg (has no administration in time range)  hydrALAZINE  (APRESOLINE ) injection 5 mg (has  no administration in time range)  acetaminophen  (TYLENOL ) tablet 650 mg (has no administration in time range)  lactated ringers  infusion ( Intravenous New Bag/Given 02/16/24 2102)  enoxaparin  (LOVENOX ) injection 45 mg (45 mg Subcutaneous Given 02/16/24 2102)  levothyroxine  (SYNTHROID ) tablet 50 mcg (has no administration in time range)  lactated ringers  bolus 1,000 mL (0 mLs Intravenous Stopped 02/16/24 1919)     IMPRESSION / MDM / ASSESSMENT AND PLAN / ED COURSE  I reviewed the triage vital signs and the nursing notes.  Differential diagnosis includes, but is not limited to, polypharmacy, overdose, dehydration or AKI, stroke  {Patient presents with symptoms of an acute illness or injury that is potentially life-threatening.  Patient presents with nonfocal altered mentation of uncertain etiology requiring medical admission.  Soft blood pressures responsive to IV fluids that seems chronic for the patient.  Nonfocal exam.  No signs of trauma.  Some mild tenderness to the medial aspect of the right foot but no clear deformity.  Reassuring imaging.  Slight worsening of her renal function consistent with mild AKI but normal electrolytes, CBC and urine.  Consult medicine for admission considering her  persistent symptoms  Clinical Course as of 02/16/24 2252  Fri Feb 16, 2024  1727 I call her husband, Dallas Lot, he isn't sure what happened. He saw her around lunchtime today, was fine then. He heard from sheriff's dept that she was here, dogs were running loose. Same thing happened last night she was admitted here.  [DS]  1940 Consult with medicine who agrees to admit [DS]    Clinical Course User Index [DS] Claudene Rover, MD     FINAL CLINICAL IMPRESSION(S) / ED DIAGNOSES   Final diagnoses:  Altered mental status, unspecified altered mental status type     Rx / DC Orders   ED Discharge Orders     None        Note:  This document was prepared using Dragon voice recognition software and may include unintentional dictation errors.   Claudene Rover, MD 02/16/24 2252

## 2024-02-17 DIAGNOSIS — E876 Hypokalemia: Secondary | ICD-10-CM | POA: Insufficient documentation

## 2024-02-17 DIAGNOSIS — E66812 Obesity, class 2: Secondary | ICD-10-CM | POA: Insufficient documentation

## 2024-02-17 LAB — BASIC METABOLIC PANEL WITH GFR
Anion gap: 9 (ref 5–15)
BUN: 14 mg/dL (ref 8–23)
CO2: 25 mmol/L (ref 22–32)
Calcium: 8.7 mg/dL — ABNORMAL LOW (ref 8.9–10.3)
Chloride: 110 mmol/L (ref 98–111)
Creatinine, Ser: 0.83 mg/dL (ref 0.44–1.00)
GFR, Estimated: >60 mL/min (ref >60)
Glucose, Bld: 93 mg/dL (ref 70–99)
Potassium: 3.1 mmol/L — ABNORMAL LOW (ref 3.5–5.1)
Sodium: 144 mmol/L (ref 135–145)

## 2024-02-17 LAB — CBC
HCT: 35.4 % — ABNORMAL LOW (ref 36.0–46.0)
Hemoglobin: 11.6 g/dL — ABNORMAL LOW (ref 12.0–15.0)
MCH: 27.8 pg (ref 26.0–34.0)
MCHC: 32.8 g/dL (ref 30.0–36.0)
MCV: 84.9 fL (ref 80.0–100.0)
Platelets: 267 K/uL (ref 150–400)
RBC: 4.17 MIL/uL (ref 3.87–5.11)
RDW: 14 % (ref 11.5–15.5)
WBC: 5.4 K/uL (ref 4.0–10.5)
nRBC: 0 % (ref 0.0–0.2)

## 2024-02-17 LAB — URINE DRUG SCREEN, QUALITATIVE (ARMC ONLY)
Amphetamines, Ur Screen: NOT DETECTED
Barbiturates, Ur Screen: NOT DETECTED
Benzodiazepine, Ur Scrn: POSITIVE — AB
Cannabinoid 50 Ng, Ur ~~LOC~~: NOT DETECTED
Cocaine Metabolite,Ur ~~LOC~~: NOT DETECTED
MDMA (Ecstasy)Ur Screen: NOT DETECTED
Methadone Scn, Ur: NOT DETECTED
Opiate, Ur Screen: NOT DETECTED
Phencyclidine (PCP) Ur S: NOT DETECTED
Tricyclic, Ur Screen: NOT DETECTED

## 2024-02-17 LAB — VITAMIN B12: Vitamin B-12: <150 pg/mL — ABNORMAL LOW (ref 180–914)

## 2024-02-17 MED ORDER — POTASSIUM CHLORIDE CRYS ER 20 MEQ PO TBCR
40.0000 meq | EXTENDED_RELEASE_TABLET | ORAL | Status: DC
  Filled 2024-02-17: qty 2

## 2024-02-17 MED ORDER — CYANOCOBALAMIN 1000 MCG/ML IJ SOLN
1000.0000 ug | Freq: Every day | INTRAMUSCULAR | Status: DC
  Administered 2024-02-17: 1000 ug via INTRAMUSCULAR
  Filled 2024-02-17: qty 1

## 2024-02-17 MED ORDER — GABAPENTIN 100 MG PO CAPS
200.0000 mg | ORAL_CAPSULE | Freq: Two times a day (BID) | ORAL | Status: DC
  Filled 2024-02-17: qty 2

## 2024-02-17 NOTE — Plan of Care (Signed)

## 2024-02-17 NOTE — Progress Notes (Signed)
 Pt was in the room and when nurse left to go get the [facility] paperwork for them to sign, the pt was gone when the nurse came back with the paperwork. Pt left AMA

## 2024-02-17 NOTE — Discharge Summary (Signed)
 Patient left hospital AGAINST MEDICAL ADVICE.  The husband came to the hospital, took patient home without telling the staff.

## 2024-02-23 ENCOUNTER — Telehealth: Payer: Self-pay | Admitting: Physician Assistant

## 2024-02-23 ENCOUNTER — Other Ambulatory Visit: Payer: Self-pay | Admitting: Emergency Medicine

## 2024-02-23 DIAGNOSIS — G4730 Sleep apnea, unspecified: Secondary | ICD-10-CM

## 2024-02-23 NOTE — Telephone Encounter (Signed)
 Called pt; can't leave VM, [facility] message sent, no DPR to call husband or son  Ref to Pulmonology ordered

## 2024-02-23 NOTE — Telephone Encounter (Signed)
 Received ZIO results through fax Printed and given to nurse

## 2024-02-23 NOTE — Telephone Encounter (Signed)
 Preliminary read of Zio patch shows a 4.2-second pause at 3:26 AM.  Typically, nocturnal pauses do not necessitate pacemaker implantation.  Recommend referral to pulmonology for sleep study.  Await MD read.

## 2024-02-26 ENCOUNTER — Encounter: Payer: Self-pay | Admitting: Sleep Medicine

## 2024-03-01 NOTE — Progress Notes (Deleted)
 Cardiology Office Note    Date:  03/01/2024   ID:  Alyne Martinson, DOB June 20, 1955, MRN [id number]  PCP:  Adina Buel HERO, MD  Cardiologist:  Deatrice Cage, MD  Electrophysiologist:  None   Chief Complaint: Hospital follow-up  History of Present Illness:   Ann Bowen is a 68 y.o. female with history of coronary artery calcification, prolonged QT, bipolar type I, bradycardia, fibromyalgia, HTN, HLD, anxiety, depression, prior gastric bypass, chronic pain syndrome, and insomnia who presents for hospital follow-up.  She was previously evaluated by outside cardiology group in 12/2020 for weakness, fatigue, confusion, and hypotension.  She was bradycardic in the 40s bpm at that time.  Echo showed an EF of 60 to 65%, no regional wall motion abnormalities, mild LVH, normal LV diastolic function parameters, normal RV systolic function and ventricular cavity size, and no significant valvular abnormalities.   She was seen by [facility] cardiology in 04/2022 during admission for influenza and UTI found to have elevated troponin and asymptomatic bradycardia.  Modification of pharmacotherapy was recommended including Zanaflex  and Requip .  Echo showed preserved LV systolic function with no regional wall motion abnormalities.  Elevated troponin in the 400 range was felt to be supply/demand ischemia and pharmacotherapy was felt to be contributing to her bradycardia.  She was admitted to the hospital in 12/2023 after suffering a syncopal episode while walking her dogs outdoors.  She reported generalized malaise and fatigue throughout the day leading up to this episode.  Upon EMS arrival she was found to be hypotensive with blood pressure in the 50s mmHg systolic.  EKG showed sinus rhythm with inferolateral T wave inversion and prolonged QTc.  Labs notable for potassium 3.2, AKI with a creatinine of 1.4 with a baseline of 0.7-0.8, initial high-sensitivity troponin 337 trending to 466.  Echo in 08/2021 showed an EF of 60 to  65%, no regional wall motion abnormalities, mild LVH, normal LV diastolic function parameters, normal RV systolic function and ventricular cavity size, calcified aortic valve with no evidence of stenosis or regurgitation, estimated right atrial pressure of 3 mmHg, and negative bubble study.  QTc was found to be prolonged in the setting of multiple psychiatric medications with recommendation to avoid these moving forward.  Echo in 12/2023 showed an EF of 60 to 65%, no regional wall motion normalities, normal LV diastolic function previous, normal RV systolic function and ventricular cavity size, mild mitral regurgitation, and an estimated right atrial pressure of 3 mmHg.  Outpatient Zio patch showed a predominant rhythm of sinus with an average rate of 51 bpm with a range of 32 to 146 bpm, 13 episodes of SVT/atrial tachycardia occurred lasting up to 16 beats, 1 nocturnal pause occurred lasting 4.2 seconds at 3:26 AM, and rare atrial and ventricular ectopy noted.  Given the nocturnal pauses, she was referred to pulmonology for sleep study.  She was readmitted in early 02/2024 with altered mental status reporting that someone was after her dog.  While in the ER she was drowsy and falling asleep easily.  Laboratory evaluation in the ER was largely unrevealing.  Chest x-ray without acute cardiopulmonary process.  CTh showed no acute intracranial abnormality.  EKG showed sinus arrhythmia with prolonged QTc.  Patient ultimately left AMA.  ***   Labs independently reviewed: 02/2024 - Hgb 11.6, PLT 267, potassium 3.1, BUN 14, serum creatinine 0.83, TSH normal, magnesium  2.1, albumin 3.2, AST/ALT normal 12/2023 - A1c less than 4.2, TC 146, TG 134, HDL 34, LDL 85, TSH 9.334, free  T4 normal, magnesium  2.0  Past Medical History:  Diagnosis Date   Arthritis    Borderline personality disorder (HCC)    Depression    Fibromyalgia    Shingles     Past Surgical History:  Procedure Laterality Date   CHOLECYSTECTOMY      COLONOSCOPY N/A 01/14/2021   Procedure: COLONOSCOPY;  Surgeon: Jinny Carmine, MD;  Location: [facility];  Service: Endoscopy;  Laterality: N/A;  positive home test 12-11-20   COLONOSCOPY WITH PROPOFOL  N/A 01/01/2018   Procedure: COLONOSCOPY WITH PROPOFOL ;  Surgeon: Unk Corinn Skiff, MD;  Location: [facility];  Service: Gastroenterology;  Laterality: N/A;   GASTRIC BYPASS     SHOULDER SURGERY      Current Medications: No outpatient medications have been marked as taking for the 03/05/24 encounter (Appointment) with Abigail Bernardino HERO, PA-C.    Allergies:   Wellbutrin  [bupropion ]   Social History   Socioeconomic History   Marital status: Married    Spouse name: Not on file   Number of children: Not on file   Years of education: Not on file   Highest education level: Not on file  Occupational History   Not on file  Tobacco Use   Smoking status: Never   Smokeless tobacco: Never  Vaping Use   Vaping status: Never Used  Substance and Sexual Activity   Alcohol  use: Yes    Comment: occasionaly   Drug use: Never   Sexual activity: Not on file  Other Topics Concern   Not on file  Social History Narrative   ** Merged History Encounter **       Social Drivers of Health   Financial Resource Strain: Not on file  Food Insecurity: Food Insecurity Present (02/16/2024)   Hunger Vital Sign    Worried About Running Out of Food in the Last Year: Often true    Ran Out of Food in the Last Year: Often true  Transportation Needs: No Transportation Needs (02/16/2024)   PRAPARE - Administrator, Civil Service (Medical): No    Lack of Transportation (Non-Medical): No  Physical Activity: Not on file  Stress: Not on file  Social Connections: Socially Isolated (02/16/2024)   Social Connection and Isolation Panel    Frequency of Communication with Friends and Family: Once a week    Frequency of Social Gatherings with Friends and Family: Never    Attends Religious Services:  Never    Database Administrator or Organizations: No    Attends Engineer, Structural: Never    Marital Status: Married     Family History:  The patient's family history includes Diabetes Mellitus II in her father, mother, and sister; Hypertension in her father. There is no history of Breast cancer.  ROS:   12-point review of systems is negative unless otherwise noted in the HPI.   EKGs/Labs/Other Studies Reviewed:    Studies reviewed were summarized above. The additional studies were reviewed today:  Zio patch 01/2024: Patient had a min HR of 32 bpm, max HR of 146 bpm, and avg HR of 51 bpm. Predominant underlying rhythm was Sinus Rhythm. 13 Supraventricular Tachycardia runs occurred, the run with the fastest interval lasting 5 beats with a max rate of 141 bpm, the  longest lasting 16 beats with an avg rate of 92 bpm. Some episodes of Supraventricular Tachycardia may be possible Atrial Tachycardia with variable block. 1 Pause occurred lasting 4.2 secs at 3:26 AM (14 bpm). Idioventricular Rhythm was present.  Isolated SVEs were  rare (<1.0%), SVE Couplets were rare (<1.0%), and SVE Triplets were rare (<1.0%). Isolated VEs were rare (<1.0%, 140), VE Couplets were rare (<1.0%, 7), and VE Triplets were rare (<1.0%, 1). Difficulty discerning atrial activity making definitive diagnosis difficult to ascertain. __________  2D echo 12/2023: 1. Left ventricular ejection fraction, by estimation, is 60 to 65%. The  left ventricle has normal function. The left ventricle has no regional  wall motion abnormalities. Left ventricular diastolic parameters were  normal.   2. Right ventricular systolic function is normal. The right ventricular  size is normal.   3. The mitral valve is normal in structure. Mild mitral valve  regurgitation. No evidence of mitral stenosis.   4. The aortic valve is normal in structure. Aortic valve regurgitation is  trivial. No aortic stenosis is present.   5. The  inferior vena cava is normal in size with greater than 50%  respiratory variability, suggesting right atrial pressure of 3 mmHg.  ____________  2D echo 08/28/2021: 1. Left ventricular ejection fraction, by estimation, is 60 to 65%. The  left ventricle has normal function. The left ventricle has no regional  wall motion abnormalities. There is mild left ventricular hypertrophy.  Left ventricular diastolic parameters  were normal.   2. Right ventricular systolic function is normal. The right ventricular  size is normal.   3. The mitral valve is normal in structure. No evidence of mitral valve  regurgitation. No evidence of mitral stenosis.   4. The aortic valve is calcified. Aortic valve regurgitation is not  visualized. No aortic stenosis is present.   5. The inferior vena cava is normal in size with greater than 50%  respiratory variability, suggesting right atrial pressure of 3 mmHg.   6. Agitated saline contrast bubble study was negative, with no evidence  of any interatrial shunt.     EKG:  EKG is ordered today.  The EKG ordered today demonstrates ***  Recent Labs: 02/16/2024: ALT 15; Magnesium  2.1; TSH 3.406 02/17/2024: BUN 14; Creatinine, Ser 0.83; Hemoglobin 11.6; Platelets 267; Potassium 3.1; Sodium 144  Recent Lipid Panel    Component Value Date/Time   CHOL 146 01/12/2024 1029   TRIG 134 01/12/2024 1029   HDL 34 (L) 01/12/2024 1029   CHOLHDL 4.3 01/12/2024 1029   VLDL 27 01/12/2024 1029   LDLCALC 85 01/12/2024 1029   LDLDIRECT 77.5 08/28/2021 0728    PHYSICAL EXAM:    VS:  There were no vitals taken for this visit.  BMI: There is no height or weight on file to calculate BMI.  Physical Exam  Wt Readings from Last 3 Encounters:  02/16/24 212 lb 8.4 oz (96.4 kg)  01/13/24 216 lb 4.3 oz (98.1 kg)  09/05/22 205 lb (93 kg)     ASSESSMENT & PLAN:   History of syncope:  Prolonged QTc:  Coronary artery calcification/HLD: ***.  LDL 85 in 12/2023.  Target LDL less  than 70.  HTN: Blood pressure  Nocturnal bradycardia: Outpatient cardiac monitoring showed a 4.2-second pause occurring at 3:26 AM concerning for untreated sleep apnea.  She has been referred to pulmonology for sleep study.  No emergent indication for PPM at this time.   {Are you ordering a CV Procedure (e.g. stress test, cath, DCCV, TEE, etc)?   Press F2        :[id number]}     Disposition: F/u with Dr. Darron or an APP in ***.   Medication Adjustments/Labs and Tests Ordered: Current medicines are  reviewed at length with the patient today.  Concerns regarding medicines are outlined above. Medication changes, Labs and Tests ordered today are summarized above and listed in the Patient Instructions accessible in Encounters.   Signed, Bernardino Bring, PA-C 03/01/2024 11:03 AM     [facility] [place] [address] [place], [place] [address] [phone]

## 2024-03-03 ENCOUNTER — Ambulatory Visit: Payer: Self-pay | Admitting: Physician Assistant

## 2024-03-03 DIAGNOSIS — R55 Syncope and collapse: Secondary | ICD-10-CM | POA: Diagnosis not present

## 2024-03-05 ENCOUNTER — Ambulatory Visit: Attending: Physician Assistant | Admitting: Physician Assistant

## 2024-03-05 DIAGNOSIS — G4730 Sleep apnea, unspecified: Secondary | ICD-10-CM

## 2024-03-05 DIAGNOSIS — I10 Essential (primary) hypertension: Secondary | ICD-10-CM

## 2024-03-05 DIAGNOSIS — R001 Bradycardia, unspecified: Secondary | ICD-10-CM

## 2024-03-05 DIAGNOSIS — R9431 Abnormal electrocardiogram [ECG] [EKG]: Secondary | ICD-10-CM

## 2024-03-05 DIAGNOSIS — I2510 Atherosclerotic heart disease of native coronary artery without angina pectoris: Secondary | ICD-10-CM

## 2024-03-05 DIAGNOSIS — R55 Syncope and collapse: Secondary | ICD-10-CM

## 2024-03-05 DIAGNOSIS — E785 Hyperlipidemia, unspecified: Secondary | ICD-10-CM

## 2024-03-08 ENCOUNTER — Encounter: Payer: Self-pay | Admitting: Physician Assistant

## 2024-03-12 ENCOUNTER — Encounter: Payer: Self-pay | Admitting: Emergency Medicine
